# Patient Record
Sex: Female | Born: 1965 | Race: White | Hispanic: No | Marital: Married | Smoking: Never smoker
Health system: Southern US, Community
[De-identification: ages and names within clinical notes are randomized; demographics above are authoritative.]

## PROBLEM LIST (undated history)

## (undated) DIAGNOSIS — J309 Allergic rhinitis, unspecified: Secondary | ICD-10-CM

## (undated) DIAGNOSIS — D239 Other benign neoplasm of skin, unspecified: Secondary | ICD-10-CM

## (undated) DIAGNOSIS — J45909 Unspecified asthma, uncomplicated: Secondary | ICD-10-CM

## (undated) DIAGNOSIS — K120 Recurrent oral aphthae: Secondary | ICD-10-CM

## (undated) DIAGNOSIS — R87619 Unspecified abnormal cytological findings in specimens from cervix uteri: Secondary | ICD-10-CM

## (undated) DIAGNOSIS — L309 Dermatitis, unspecified: Secondary | ICD-10-CM

## (undated) DIAGNOSIS — K219 Gastro-esophageal reflux disease without esophagitis: Secondary | ICD-10-CM

## (undated) DIAGNOSIS — F329 Major depressive disorder, single episode, unspecified: Secondary | ICD-10-CM

## (undated) HISTORY — DX: Gastro-esophageal reflux disease without esophagitis: K21.9

## (undated) HISTORY — DX: Other benign neoplasm of skin, unspecified: D23.9

## (undated) HISTORY — DX: Recurrent oral aphthae: K12.0

## (undated) HISTORY — DX: Dermatitis, unspecified: L30.9

## (undated) HISTORY — PX: TONSILLECTOMY AND ADENOIDECTOMY: SUR1326

## (undated) HISTORY — DX: Major depressive disorder, single episode, unspecified: F32.9

## (undated) HISTORY — DX: Unspecified asthma, uncomplicated: J45.909

## (undated) HISTORY — DX: Allergic rhinitis, unspecified: J30.9

## (undated) HISTORY — PX: WISDOM TOOTH EXTRACTION: SHX21

## (undated) HISTORY — DX: Unspecified abnormal cytological findings in specimens from cervix uteri: R87.619

---

## 2000-12-20 ENCOUNTER — Other Ambulatory Visit: Admission: RE | Admit: 2000-12-20 | Discharge: 2000-12-20 | Payer: Self-pay | Admitting: Obstetrics and Gynecology

## 2001-01-31 ENCOUNTER — Inpatient Hospital Stay (HOSPITAL_COMMUNITY): Admission: EM | Admit: 2001-01-31 | Discharge: 2001-02-07 | Payer: Self-pay | Admitting: Psychiatry

## 2001-02-03 ENCOUNTER — Encounter: Payer: Self-pay | Admitting: Psychiatry

## 2001-02-03 ENCOUNTER — Ambulatory Visit (HOSPITAL_COMMUNITY): Admission: RE | Admit: 2001-02-03 | Discharge: 2001-02-03 | Payer: Self-pay | Admitting: Psychiatry

## 2001-02-10 ENCOUNTER — Other Ambulatory Visit (HOSPITAL_COMMUNITY): Admission: RE | Admit: 2001-02-10 | Discharge: 2001-02-17 | Payer: Self-pay | Admitting: *Deleted

## 2001-04-09 ENCOUNTER — Encounter: Admission: RE | Admit: 2001-04-09 | Discharge: 2001-04-09 | Payer: Self-pay | Admitting: *Deleted

## 2002-01-16 ENCOUNTER — Ambulatory Visit (HOSPITAL_COMMUNITY): Admission: RE | Admit: 2002-01-16 | Discharge: 2002-01-16 | Payer: Self-pay | Admitting: Family Medicine

## 2002-01-16 ENCOUNTER — Encounter: Payer: Self-pay | Admitting: Family Medicine

## 2002-11-20 ENCOUNTER — Other Ambulatory Visit: Admission: RE | Admit: 2002-11-20 | Discharge: 2002-11-20 | Payer: Self-pay | Admitting: *Deleted

## 2002-11-24 ENCOUNTER — Encounter: Payer: Self-pay | Admitting: Obstetrics and Gynecology

## 2002-11-24 ENCOUNTER — Encounter: Admission: RE | Admit: 2002-11-24 | Discharge: 2002-11-24 | Payer: Self-pay | Admitting: Obstetrics and Gynecology

## 2003-09-21 ENCOUNTER — Encounter: Payer: Self-pay | Admitting: Internal Medicine

## 2004-02-01 ENCOUNTER — Emergency Department (HOSPITAL_COMMUNITY): Admission: EM | Admit: 2004-02-01 | Discharge: 2004-02-01 | Payer: Self-pay | Admitting: Emergency Medicine

## 2004-06-06 ENCOUNTER — Other Ambulatory Visit: Admission: RE | Admit: 2004-06-06 | Discharge: 2004-06-06 | Payer: Self-pay | Admitting: *Deleted

## 2004-06-08 ENCOUNTER — Ambulatory Visit: Payer: Self-pay | Admitting: Internal Medicine

## 2004-06-29 ENCOUNTER — Ambulatory Visit: Payer: Self-pay | Admitting: Internal Medicine

## 2004-10-04 ENCOUNTER — Ambulatory Visit: Payer: Self-pay | Admitting: Family Medicine

## 2005-04-30 ENCOUNTER — Ambulatory Visit: Payer: Self-pay | Admitting: Internal Medicine

## 2005-11-01 ENCOUNTER — Other Ambulatory Visit: Admission: RE | Admit: 2005-11-01 | Discharge: 2005-11-01 | Payer: Self-pay | Admitting: Obstetrics and Gynecology

## 2006-01-29 ENCOUNTER — Emergency Department (HOSPITAL_COMMUNITY): Admission: EM | Admit: 2006-01-29 | Discharge: 2006-01-29 | Payer: Self-pay | Admitting: Emergency Medicine

## 2006-08-19 ENCOUNTER — Ambulatory Visit: Payer: Self-pay | Admitting: Internal Medicine

## 2006-08-20 ENCOUNTER — Encounter: Payer: Self-pay | Admitting: Internal Medicine

## 2007-04-15 ENCOUNTER — Encounter: Payer: Self-pay | Admitting: Internal Medicine

## 2007-04-17 ENCOUNTER — Encounter: Payer: Self-pay | Admitting: Internal Medicine

## 2007-11-07 ENCOUNTER — Ambulatory Visit: Payer: Self-pay | Admitting: Internal Medicine

## 2007-11-07 DIAGNOSIS — K219 Gastro-esophageal reflux disease without esophagitis: Secondary | ICD-10-CM | POA: Insufficient documentation

## 2007-11-07 DIAGNOSIS — F3289 Other specified depressive episodes: Secondary | ICD-10-CM

## 2007-11-07 DIAGNOSIS — K120 Recurrent oral aphthae: Secondary | ICD-10-CM | POA: Insufficient documentation

## 2007-11-07 DIAGNOSIS — J309 Allergic rhinitis, unspecified: Secondary | ICD-10-CM

## 2007-11-07 DIAGNOSIS — R070 Pain in throat: Secondary | ICD-10-CM | POA: Insufficient documentation

## 2007-11-07 DIAGNOSIS — F329 Major depressive disorder, single episode, unspecified: Secondary | ICD-10-CM

## 2007-11-07 HISTORY — DX: Other specified depressive episodes: F32.89

## 2007-11-07 HISTORY — DX: Gastro-esophageal reflux disease without esophagitis: K21.9

## 2007-11-07 HISTORY — DX: Major depressive disorder, single episode, unspecified: F32.9

## 2007-11-07 HISTORY — DX: Allergic rhinitis, unspecified: J30.9

## 2007-11-07 HISTORY — DX: Recurrent oral aphthae: K12.0

## 2007-11-10 LAB — CONVERTED CEMR LAB
Basophils Absolute: 0 10*3/uL (ref 0.0–0.1)
Basophils Relative: 0.6 % (ref 0.0–3.0)
Cholesterol: 136 mg/dL (ref 0–200)
Eosinophils Absolute: 0.2 10*3/uL (ref 0.0–0.7)
Eosinophils Relative: 4.2 % (ref 0.0–5.0)
HCT: 34.7 % — ABNORMAL LOW (ref 36.0–46.0)
HDL: 47.8 mg/dL (ref >39.0)
Hemoglobin: 12.1 g/dL (ref 12.0–15.0)
LDL Cholesterol: 74 mg/dL (ref 0–99)
Lymphocytes Relative: 25.6 % (ref 12.0–46.0)
MCHC: 34.8 g/dL (ref 30.0–36.0)
MCV: 94.1 fL (ref 78.0–100.0)
Monocytes Absolute: 0.6 10*3/uL (ref 0.1–1.0)
Monocytes Relative: 14.1 % — ABNORMAL HIGH (ref 3.0–12.0)
Neutro Abs: 2.3 10*3/uL (ref 1.4–7.7)
Neutrophils Relative %: 55.5 % (ref 43.0–77.0)
Platelets: 258 10*3/uL (ref 150–400)
RBC: 3.69 M/uL — ABNORMAL LOW (ref 3.87–5.11)
RDW: 13.4 % (ref 11.5–14.6)
Total CHOL/HDL Ratio: 2.8
Triglycerides: 70 mg/dL (ref 0–149)
VLDL: 14 mg/dL (ref 0–40)
WBC: 4.2 10*3/uL — ABNORMAL LOW (ref 4.5–10.5)

## 2007-12-07 ENCOUNTER — Encounter: Payer: Self-pay | Admitting: Internal Medicine

## 2007-12-18 ENCOUNTER — Telehealth: Payer: Self-pay | Admitting: Internal Medicine

## 2008-02-18 ENCOUNTER — Encounter: Admission: RE | Admit: 2008-02-18 | Discharge: 2008-02-18 | Payer: Self-pay | Admitting: Internal Medicine

## 2008-02-18 LAB — HM MAMMOGRAPHY: HM Mammogram: NEGATIVE

## 2009-07-25 ENCOUNTER — Ambulatory Visit: Payer: Self-pay | Admitting: Internal Medicine

## 2009-07-25 DIAGNOSIS — D239 Other benign neoplasm of skin, unspecified: Secondary | ICD-10-CM

## 2009-07-25 HISTORY — DX: Other benign neoplasm of skin, unspecified: D23.9

## 2009-09-08 ENCOUNTER — Telehealth (INDEPENDENT_AMBULATORY_CARE_PROVIDER_SITE_OTHER): Payer: Self-pay | Admitting: *Deleted

## 2010-02-02 ENCOUNTER — Ambulatory Visit: Payer: Self-pay | Admitting: Internal Medicine

## 2010-03-12 ENCOUNTER — Encounter: Payer: Self-pay | Admitting: Otolaryngology

## 2010-03-21 NOTE — Assessment & Plan Note (Signed)
Summary: reaction to med,sinus infection/jls   Vital Signs:  Patient Profile:   45 Years Old Female Weight:      133 pounds Temp:     98.3 degrees F oral Pulse rate:   72 / minute BP sitting:   100 / 70  (left arm) Cuff size:   regular  Vitals Entered By: Romualdo Bolk, CMA (November 07, 2007 11:37 AM)                 Chief Complaint:  Blisters in mouth.  History of Present Illness: Lisa Perkins is here for blisters in mouth. Pt was seen on [facility] on 11/04/07. Pt started on moxitag on 11/04/07 and then ceftin on 11/06/07. For sinus infection. Had no fever and head congestino and stuffy nose for about 2 days . Tends to get sinusitis if lets things go  So was rx with antibiotic.   Developing ulcers in the mouth but no white patches and no current fever but htinks had fever with uri.  Pt states that blisters are going down her throat.Burning pain to swallo high in neck.    Hx of extensive  gerd and ent allergy evals in the past. Is allergic on skin testing but gets side effect of PPis .  So takes nothing.  Has depression rx with lamictal   doing ok.    Prior Medications Reviewed Using: Patient Recall  Updated Prior Medication List: CITALOPRAM HYDROBROMIDE 10 MG TABS (CITALOPRAM HYDROBROMIDE) 3 by mouth once daily LAMICTAL 100 MG TABS (LAMOTRIGINE) 1 by mouth once daily  Current Allergies (reviewed today): ! 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         no brusing bleedingrashes   Physical Exam  General:     alert, well-developed, and well-nourished.   Head:     normocephalic and atraumatic.   Eyes:     vision grossly intact, pupils equal, and pupils round.   Ears:     R ear normal and L ear normal.   Nose:     no external deformity, no external erythema, and no nasal discharge.  minimal congestion Mouth:     op clear  apthous ulcer  under tongue  .? cobblestoning  Neck:     No deformities, masses, or noted. tenderness at ac area  Lungs:     Normal respiratory effort, chest expands symmetrically. Lungs are clear to auscultation, no crackles or wheezes. Heart:     Normal rate and regular rhythm. S1 and S2 normal without gallop, murmur, click, rub or other extra sounds. Abdomen:     Bowel sounds positive,abdomen soft and non-tender without masses, organomegaly or hernias noted. Skin:     turgor normal, no ecchymoses, and no petechiae.   Cervical  Nodes:     no anterior cervical adenopathy and no posterior cervical adenopathy.  but tender ac Psych:     memory intact for recent and remote, normally interactive, and good eye contact.      Impression & Recommendations:  Problem # 1:  THROAT PAIN (ICD-784.1) ? if related to antibiotic or not .  ? if uri was viral anyway  no thrush seen but at risk  .  check cbc for ? cw viral  Orders: TLB-CBC Platelet - w/Differential (85025-CBCD)   Problem # 2:  ORAL APHTHAE (ICD-528.2) local care as above call as needed Orders: TLB-CBC Platelet - w/Differential (85025-CBCD)   Problem # 3:  ALLERGIC RHINITIS (ICD-477.9) Assessment: Comment Only  Problem # 4:  ? of ADVERSE REACTION TO MEDICATION (WUX-324.40) ? if antibiotic  Problem # 5:  PREVENTIVE HEALTH CARE (ICD-V70.0) pt want tocheck this as not done recently Orders: TLB-Lipid Panel (80061-LIPID)   Problem # 6:  DEPRESSION (ICD-311) uncer  specialty care Her updated medication list for this problem includes:    Citalopram Hydrobromide 10 Mg Tabs (Citalopram hydrobromide) .Marland KitchenMarland KitchenMarland KitchenMarland Kitchen 3 by mouth once daily   Complete Medication List: 1)  Citalopram Hydrobromide 10 Mg Tabs (Citalopram hydrobromide) .... 3 by mouth once daily 2)  Lamictal 100 Mg Tabs (Lamotrigine) .Marland Kitchen.. 1 by mouth once daily   Patient Instructions: 1)  call you about your blood count 2)  stop the antibiotic for now 3)  Peroxyl       or peroxide  rinses for mouth ulcers   4)  try zantac two times a day for now.      ]

## 2010-03-21 NOTE — Consult Note (Signed)
Summary: Dr Randa Evens note  Dr Randa Evens note   Imported By: Kassie Mends 04/24/2007 11:13:12  _____________________________________________________________________  External Attachment:    Type:   Image     Comment:   Dr Randa Evens note

## 2010-03-21 NOTE — Procedures (Signed)
Summary: endoscopy   endoscopy   Imported By: Kassie Mends 04/29/2007 08:14:59  _____________________________________________________________________  External Attachment:    Type:   Image     Comment:   endoscopy

## 2010-03-21 NOTE — Progress Notes (Signed)
Summary: drug interaction  Phone Note Call from Patient Call back at [phone]   Caller: Patient Call For: Madelin Headings MD Reason for Call: Talk to Doctor Details for Reason: drug interaction Summary of Call: patient is calling because she is concerned about a possible drug interation between Celexa and Prilosec.  She was given prilosec at an urgent care for GERD.  soon after she started having chest pain which felt like a panic attack.  She has stopped both the Celexa and Prilosec for a few days and is no longer having chest pain.  she would like to know what to do next? Initial call taken by: Kern Reap CMA Duncan Dull),  September 08, 2009 10:52 AM  Follow-up for Phone Call        Im not sure  that  this is is a drug interaction but would have her  call her Psych  and see what they say.     I dont see sig I/A listed  on  our med program .    If she is having panic she need to see them  Follow-up by: Madelin Headings MD,  September 08, 2009 12:38 PM  Additional Follow-up for Phone Call Additional follow up Details #1::        attempt to call - ans mach - LMTCB if questions -should call Psych. to be seen ?panic attack. KIK Additional Follow-up by: Duard Brady LPN,  September 09, 2009 8:17 AM

## 2010-03-21 NOTE — Miscellaneous (Signed)
Summary: Influenza Vaccination/[facility]  Influenza Vaccination/[facility]   Imported By: Maryln Gottron 12/12/2007 14:40:37  _____________________________________________________________________  External Attachment:    Type:   Image     Comment:   External Document

## 2010-03-21 NOTE — Progress Notes (Signed)
Summary: Tetanus  Phone Note Call from Patient   Caller: Patient Call For: Dr. Fabian Sharp Summary of Call: Pt. had a dog bite last night, and is being treated but needs to know about her Tetanus.  She thinks that her last tetanus was at age 45. [phone] Initial call taken by: Lynann Beaver CMA,  December 18, 2007 8:15 AM  Follow-up for Phone Call        Pt needs to have a Tdap because there is nothing in her chart about her last td. Follow-up by: Romualdo Bolk, CMA,  December 18, 2007 11:25 AM  Additional Follow-up for Phone Call Additional follow up Details #1::        Left message to call back for appt for Tdap with [facility]. Additional Follow-up by: Lynann Beaver CMA,  December 18, 2007 11:29 AM

## 2010-03-21 NOTE — Consult Note (Signed)
[facility]   Imported By: Kassie Mends 11/10/2007 10:37:27  _____________________________________________________________________  External Attachment:    Type:   Image     Comment:   [facility]

## 2010-03-21 NOTE — Assessment & Plan Note (Signed)
Summary: EVAL OF MOLE // RS   Vital Signs:  Patient profile:   45 year old female Menstrual status:  regular LMP:     07/11/2009 Height:      66.75 inches Weight:      134 pounds BMI:     21.22 Pulse rate:   72 / minute BP sitting:   100 / 60  (right arm) Cuff size:   regular  Vitals Entered By: Romualdo Bolk, CMA (AAMA) (July 25, 2009 3:06 PM) CC: Mole Check- 2 on stomach- and 1 on left hand needs to be checked. LMP (date): 07/11/2009 LMP - Character: normal Menarche (age onset years): 12   Menses interval (days): 28 Menstrual flow (days): 4-5 Menstrual Status regular Enter LMP: 07/11/2009   History of Present Illness: Lisa Perkins comes in today  for check moles  . family hx of cancer on face  .  ? if one changing on abdomen. NO med change  sees Dr Misty Stanley Pauhlus. travels [facility]  and is in the car a good bit .  New sopr left hand. otherwise, since last visit  here  there have been no major changes in health status  .  Preventive Screening-Counseling & Management  Alcohol-Tobacco     Alcohol drinks/day: <1     Alcohol type: all     Smoking Status: never  Caffeine-Diet-Exercise     Caffeine use/day: 2     Does Patient Exercise: yes  Current Medications (verified): 1)  Citalopram Hydrobromide 10 Mg Tabs (Citalopram Hydrobromide) .... 3 By Mouth Once Daily 2)  Lamictal 100 Mg Tabs (Lamotrigine) .Marland Kitchen.. 1 By Mouth Once Daily  Allergies (verified): 1)  ! Erythromycin 2)  Amoxicillin  Past History:  Care Management: Gynecology: [facility] Psychiatry: [facility].  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   sun freckling  forehead and scalp area  no suspicious lesions. left hand 1 mm brown flat mole  symmertical  trunk right  ..  pencil  eraser sized flat mole .  not raised.    Impression & Recommendations:  Problem # 1:  BENIGN NEOPLASM OF SKIN SITE UNSPECIFIED (ICD-216.9) none particualry suspcious   but  has fair  skin and family hx  reviewed and counseled about sunscreen daily use .  PT agrees.  Complete Medication List: 1)  Citalopram Hydrobromide 10 Mg Tabs (Citalopram hydrobromide) .... 3 by mouth once daily 2)  Lamictal 100 Mg Tabs (Lamotrigine) .Marland Kitchen.. 1 by mouth once daily  greater than 50% of visit spent in counseling  15 minutes

## 2010-03-23 NOTE — Assessment & Plan Note (Signed)
Summary: knee pain/due to dog bite/njr   Vital Signs:  Patient profile:   45 year old female Menstrual status:  regular Weight:      136 pounds Temp:     98.3 degrees F oral BP sitting:   108 / 70  (right arm) Cuff size:   regular  Vitals Entered By: Duard Brady LPN (February 02, 2010 3:12 PM) CC: c/o pain on (L) lef - dog bite upper back of calf Is Patient Diabetic? No   CC:  c/o pain on (L) lef - dog bite upper back of calf.  History of Present Illness: 45 year old who is seen today 7 days following a dog bite to her left calf area.  She was evaluated at the urgent care and given a tetanus booster and is complaining Augmentin antibiotic therapy.  She was concerned about some increasing pain, mainly  a tingling sensation in the left lower leg area is also noted some mild discomfort in the left hip region.  There is been no fever or constitutional complaints.  Her puncture  wound  and ecchymoses  are  nicely healing  Allergies: 1)  ! Erythromycin 2)  Amoxicillin  Physical Exam  General:  Well-developed,well-nourished,in no acute distress; alert,appropriate and cooperative throughout examination Msk:  patient has a small puncture wound involving the left upper posterior medial aspect of the calf.  This appeared to be clean and crusted without surrounding erythema.  There is no drainage.  There were some resolving ecchymoses with some subcutaneous hematoma formation   Impression & Recommendations:  Problem # 1:  DOG BITE (ICD-E906.0) the wound appears to be responding quite well to prophylactic antibiotic therapy and there is no sign of infection.  Suspect the hip pain may be related to the injury and altered biomechanics.  Complete Medication List: 1)  Citalopram Hydrobromide 10 Mg Tabs (Citalopram hydrobromide) .... 3 by mouth once daily 2)  Lamictal 100 Mg Tabs (Lamotrigine) .Marland Kitchen.. 1 by mouth once daily 3)  Augmentin 875-125 Mg Tabs (Amoxicillin-pot clavulanate) 4)   Tramadol Hcl 50 Mg Tabs (Tramadol hcl) .... One every 6 hours for pain  Patient Instructions: 1)  call if  you  develop  fever or worsening pain or drainage Prescriptions: TRAMADOL HCL 50 MG TABS (TRAMADOL HCL) one every 6 hours for pain  #30 x 0   Entered and Authorized by:   Gordy Savers  MD   Signed by:   Gordy Savers  MD on 02/02/2010   Method used:   Electronically to        [facility] [address] (retail)       [address]       [place], [place]  [address]       Ph: [phone] or [phone]       Fax: [phone]   RxID:   [id number]    Orders Added: 1)  Est. Patient Level III [27253]

## 2010-07-07 NOTE — Discharge Summary (Signed)
[facility]  Patient:    Lisa Perkins Visit Number: [id number] MRN: [id number]          Service Type: PSY Location: 400 [id number] 02 Attending Physician:  Jeanice Lim Dictated by:   Jeanice Lim, M.D. Admit Date:  01/31/2001 Discharge Date: 02/07/2001                             Discharge Summary  IDENTIFYING DATA:  This is a 45 year old divorced white female that was voluntarily admitted on December 13 for bizarre thinking.  She was rambling, having been feeling that she was dehydrated and leaking spinal fluid because her urine was clear.   She believed she was having multisystem failure, had been changing her diet, taking vitamins and herbal supplements.  The patient had seen Ollen Gross, psychologist in [place].  ADMISSION MEDICATIONS:  Zoloft started in December, taking other supplements, multivitamins, calcium and magnesium supplements, Similase enzymes, permeability factor, water soluble vitamin A for the past 2-3 weeks.  ALLERGIES:  AMOXICILLIN, ZITHROMYCIN.  PHYSICAL EXAMINATION:  Unremarkable.  Neurologically nonfocal.  ROUTINE ADMISSION LABS:  Essentially within normal limits except for low potassium 3.1.  Urine was negative nitrite and thyroid and urine pregnancy were negative.  MENTAL STATUS EXAMINATION:  This is an alert, young middle-aged, casually dressed female in the interview room with a pillow and stuffed animal.  She was cooperative, with good eye contact.  Speech within normal limits.  Mood pleasant and depressed, affect sad and flat.  Thought processes were goal directed.  Thought content positive for delusions, no paranoid ideation or hallucinations, and no suicidal or homicidal ideations.  ADMISSION DIAGNOSES: Axis I:    1. Major depression with psychotic features.            2. Obsessive-compulsive disorder Axis II:   None. Axis III:  Multiple somatic complaints. Axis IV:   Moderate, problems with primary  support. Axis V:    35/75.  HOSPITAL COURSE:  The patient was admitted with routine p.r.n. medications and started on Zyprexa and Celexa for depressive, obsessive, and delusional thoughts.  The patient tolerated medications well and reality testing improved.  She reported improvement in insight regarding her mental illness and awareness that most of her complaints were related to her nerves and not a physical illness, as well the dangerousness of taking herbal supplements including especially high doses of Vitamin A which can cause toxicity.  CONDITION ON DISCHARGE:  Markedly improved.  Her mood was more euthymic, affect brighter, thought processes goal directed, thought content was no longer preoccupied with somatic delusions, still at risk of being suggestible regarding having physical disorders, but no overt psychotic symptoms and no hallucinations, and no suicidal or homicidal ideations.  Patient reported motivation to be compliant with followup plan.  DISCHARGE MEDICATIONS: 1. Bactrim DS b.i.d. 2. Calcium carbonate 500 mg qam 3. Celexa 40 mg qam 4. Klonopin 0.5 mg q.h.s. 5. Zyprexa 10 mg q.h.s. 6. Ambien 10 mg p.r.n.  The patient was advised to to take over-the-counter meds unless improved by her psychiatrist, eat a balanced diet with no restrictions and to take no added vitamins at this time.  She was also recommended to follow up with a nutritionist and with her therapist, Ollen Gross, as well as start the Intensive Outpatient Program on December 23 at 9 a.m.  DISCHARGE DIAGNOSES: Axis I:    1. Major depression with psychotic features.  2. Obsessive-compulsive disorder Axis II:   None. Axis III:  Multiple somatic complaints. Axis IV:   Moderate, problems with primary support. Axis V:    Global assessment of function on discharge was 55. Dictated by:   Jeanice Lim, M.D. Attending Physician:  Jeanice Lim DD:  03/08/01 TD:  03/08/01 Job:  [id number]

## 2010-07-07 NOTE — H&P (Signed)
[facility]  Patient:    Loleta Books Visit Number: [id number] MRN: [id number]          Service Type: EMS Location: [facility] Attending Physician:  Armanda Heritage Dictated by:   Candi Leash. Orsini, N.P. Admit Date:  01/30/2001 Discharge Date: 01/30/2001                     Psychiatric Admission Assessment  DATE OF ADMISSION:  January 31, 2001  PATIENT IDENTIFICATION:  This is a 45 year old divorced white female that was voluntarily admitted on January 31, 2001, for bizarre thinking.  HISTORY OF PRESENT ILLNESS:  The patient presents to [facility] Dept with rambling, history of being "dehydrated," and "leaking cerebral spinal fluid" because her urine is clear.  The patient reports having a history of panic attacks, three to four per month.  The patient reports having several different somatic problems, joint pain, cognitive changes, feeling like she is having "multisystem failure."  The patient has been going to several M.D.s, using herbal supplements, changing her diet, taking vitamins.  The patient reports that she is also having episodes of crying with fleeting suicidal ideation to run her car off the road with one episode of hearing someone screaming in her ear.  Denies any visual hallucinations or paranoia.  The patient continues to feel that she is having to "search for words" during the interview.  The patient denies any suicidal or homicidal ideations or psychotic symptoms.  Sleeping has been decreased.  Appetite has been decreased.  The patient reports that she has been eating only 600-800 calories per day.  PAST PSYCHIATRIC HISTORY:  First visit to [facility].  The patient sees Ollen Gross, psychologist in [place].  SUBSTANCE ABUSE HISTORY:  Nonsmoker, nondrinker.  Denies any substance abuse.  PAST MEDICAL HISTORY:  Primary care Naome Brigandi: Neta Mends. Fabian Sharp, M.D. [facility], on [facility].  Medical problems: GERD,  chronic lower back pain.  MEDICATIONS: 1. Zoloft 25 mg started on January 30, 2001. 2. Taking other supplements, multivitamin, calcium, magnesium supplements and    has been taking Similase enzymes, permeability factor, water soluble    vitamin A that she gets through the mail for the past two to three    weeks.  DRUG ALLERGIES:  AMOXICILLIN, AZITHROMYCIN.  PHYSICAL EXAMINATION:  GENERAL:  Performed at [facility].  LABORATORY DATA:  CBC was within normal limits.  Potassium initially was 3.1. Urine showed many bacteria, many epithelials, negative nitrites.  Thyroid and urine pregnancy tests are pending.  SOCIAL HISTORY:  She is a 45 year old divorced white female, divorced for three years.  She has no children.  She lives with her boyfriend.  She works as a Doctor, general practice at [employer].  Has no legal problems.  FAMILY HISTORY:  Sister with panic attacks and depression, on Zoloft.  MENTAL STATUS EXAMINATION:  She is an alert, young middle-aged, casually dressed female in the interview room with a pillow and a stuffed animal.  She is cooperative with good eye contact.  Speech is normal and relevant and articulate.  Mood is pleasant, depressed.  Affect is sad and flat.  Thought processes: Positive delusions, no paranoia, no auditory or visual hallucinations, no suicidal or homicidal ideations.  Cognitive: Intact. Oriented x 3.  Judgment is fair.  Insight is fair.  Appears to have above average intelligence.  ADMISSION DIAGNOSES: Axis I:    1. Major depression with psychotic features.  2. Rule out obsessive-compulsive disorder. Axis II:   Deferred. Axis III:  Multiple somatic complaints. Axis IV:   Deferred. Axis V:    Current is 35, estimated this past year is 75.  INITIAL PLAN OF CARE:  Plan is a voluntary admission to [facility] for depression, anxiety, and suicidal ideation and delusional thinking. Contract for safety.  Check  every 15 minutes.  The patient is put on a locked hall for close monitoring.  Will resume her Zoloft and increase to decrease depressive symptoms and Ativan for anxiety.  Will obtain further labs.  Will repeat her urine and CMET.  Will have a session with her boyfriend and her mother.  Will monitor her food and fluid intake.  Gatorade will be offered for potassium and to ensure fluid intake.  Goal is to return the patient to prior living arrangements, to stabilize mood and thinking so the patient can be safe, to follow up with mental health and her private psychiatrist.  ESTIMATED LENGTH OF STAY:  Four to five days. Dictated by:   Candi Leash. Orsini, N.P. Attending Physician:  Armanda Heritage DD:  02/03/01 TD:  02/03/01 Job: [id number]

## 2011-01-25 ENCOUNTER — Ambulatory Visit (INDEPENDENT_AMBULATORY_CARE_PROVIDER_SITE_OTHER): Payer: 59 | Admitting: Internal Medicine

## 2011-01-25 ENCOUNTER — Encounter: Payer: Self-pay | Admitting: Internal Medicine

## 2011-01-25 VITALS — BP 100/80 | Temp 98.3°F | Ht 67.75 in | Wt 139.0 lb

## 2011-01-25 DIAGNOSIS — M545 Low back pain, unspecified: Secondary | ICD-10-CM

## 2011-01-25 MED ORDER — TRAMADOL HCL 50 MG PO TABS: 50.0000 mg | ORAL_TABLET | Freq: Four times a day (QID) | ORAL | Status: DC | PRN

## 2011-01-25 NOTE — Progress Notes (Signed)
  Subjective:    Patient ID: Lisa Perkins, female    DOB: [birth year], 45 y.o.   MRN: [id number]  HPI  45 year old patient who presents complaining of low back pain that occurred 4 days ago while outdoor yard work over the weekend. She presents with radiographs and she states that she has a history of scoliosis and also lumbar disc disease. She feels that she has pulled a muscle involved the right lumbar area and is steadily improving. She has been using her husband's Celebrex with improvement    Review of Systems  Constitutional: Negative.   HENT: Negative for hearing loss, congestion, sore throat, rhinorrhea, dental problem, sinus pressure and tinnitus.   Eyes: Negative for pain, discharge and visual disturbance.  Respiratory: Negative for cough and shortness of breath.   Cardiovascular: Negative for chest pain, palpitations and leg swelling.  Gastrointestinal: Negative for nausea, vomiting, abdominal pain, diarrhea, constipation, blood in stool and abdominal distention.  Genitourinary: Negative for dysuria, urgency, frequency, hematuria, flank pain, vaginal bleeding, vaginal discharge, difficulty urinating, vaginal pain and pelvic pain.  Musculoskeletal: Positive for back pain. Negative for joint swelling, arthralgias and gait problem.  Skin: Negative for rash.  Neurological: Negative for dizziness, syncope, speech difficulty, weakness, numbness and headaches.  Hematological: Negative for adenopathy.  Psychiatric/Behavioral: Negative for behavioral problems, dysphoric mood and agitation. The patient is not nervous/anxious.        Objective:   Physical Exam  Constitutional: She appears well-developed and well-nourished. No distress.  Musculoskeletal: Normal range of motion. She exhibits tenderness. She exhibits no edema.       Mild tenderness right lumbar spine Negative straight leg test          Assessment & Plan:   Back pain secondary to musculoligamentous strain. Samples  of Celebrex dispensed Low back pain issues discussed Return when necessary

## 2011-01-25 NOTE — Patient Instructions (Addendum)
Most patients with low back pain will improve with time.  Keep active but avoid any activities that cause pain.  Apply moist heat to the low back area several times daily.  Celebrex 2 capsules daily  Call or return to clinic prn if these symptoms worsen or fail to improve as anticipated.

## 2011-06-07 ENCOUNTER — Telehealth: Payer: Self-pay | Admitting: *Deleted

## 2011-06-07 MED ORDER — OLOPATADINE HCL 0.1 % OP SOLN: 1.0000 [drp] | Freq: Two times a day (BID) | OPHTHALMIC | Status: DC

## 2011-06-07 NOTE — Telephone Encounter (Addendum)
Pt states she needs Patanol Eye Drops.  Her eye is matted shut in the am, and has to wash it out q am.   Believes Dr. Fabian Sharp has done this before.

## 2011-06-07 NOTE — Telephone Encounter (Signed)
Ok to send in [employer] in

## 2011-06-08 MED ORDER — OLOPATADINE HCL 0.1 % OP SOLN: 1.0000 [drp] | Freq: Two times a day (BID) | OPHTHALMIC | Status: DC

## 2011-08-08 ENCOUNTER — Ambulatory Visit (INDEPENDENT_AMBULATORY_CARE_PROVIDER_SITE_OTHER): Payer: 59 | Admitting: Internal Medicine

## 2011-08-08 ENCOUNTER — Encounter: Payer: Self-pay | Admitting: Internal Medicine

## 2011-08-08 ENCOUNTER — Other Ambulatory Visit (INDEPENDENT_AMBULATORY_CARE_PROVIDER_SITE_OTHER): Payer: 59 | Admitting: Family Medicine

## 2011-08-08 VITALS — BP 104/60 | HR 90 | Temp 98.7°F | Wt 133.0 lb

## 2011-08-08 DIAGNOSIS — R8290 Unspecified abnormal findings in urine: Secondary | ICD-10-CM

## 2011-08-08 DIAGNOSIS — N898 Other specified noninflammatory disorders of vagina: Secondary | ICD-10-CM

## 2011-08-08 DIAGNOSIS — L293 Anogenital pruritus, unspecified: Secondary | ICD-10-CM

## 2011-08-08 DIAGNOSIS — R300 Dysuria: Secondary | ICD-10-CM

## 2011-08-08 DIAGNOSIS — R109 Unspecified abdominal pain: Secondary | ICD-10-CM

## 2011-08-08 DIAGNOSIS — R82998 Other abnormal findings in urine: Secondary | ICD-10-CM

## 2011-08-08 LAB — POCT URINALYSIS DIPSTICK
Bilirubin, UA: NEGATIVE
Glucose, UA: NEGATIVE
Ketones, UA: NEGATIVE
Nitrite, UA: NEGATIVE
Protein, UA: NEGATIVE
Spec Grav, UA: >=1.03
Urobilinogen, UA: 0.2
pH, UA: 8

## 2011-08-08 LAB — POCT URINALYSIS DIP (MANUAL ENTRY)
Bilirubin, UA: NEGATIVE
Glucose, UA: NEGATIVE
Ketones, POC UA: NEGATIVE
Leukocytes, UA: NEGATIVE
Nitrite, UA: NEGATIVE
Protein Ur, POC: NEGATIVE
Spec Grav, UA: 1.01
Urobilinogen, UA: 0.2
pH, UA: 7

## 2011-08-08 MED ORDER — FLUCONAZOLE 150 MG PO TABS: 150.0000 mg | ORAL_TABLET | Freq: Once | ORAL | Status: AC

## 2011-08-08 NOTE — Progress Notes (Signed)
  Subjective:    Patient ID: Lisa Perkins, female    DOB: [birth year], 46 y.o.   MRN: [id number]  HPI Patient comes in today for SDA for  new problem evaluation. Last visit with me was 12 09 was seen  byt dr Kirtland Bouchard for backpain lat December . Diflucan in the past via urgent care. For the vaginal itching is helpful no dc.  Has gyne Shirlyn Goltz.  5 month check up was normal .  No fever  abd cramps and poss uti.   Some bloating.  Little urgency.  No  To little risk of sti.  Mood stable  Per psych [facility]   Review of Systems No fever chills  Cp sob hematuria  Past history family history social history reviewed in the electronic medical record.     Objective:   Physical Exam  BP 104/60  Pulse 90  Temp 98.7 F (37.1 C) (Oral)  Wt 133 lb (60.328 kg)  SpO2 99%  LMP 08/02/2011 wdwn in nad Abdomen:  Sof,t normal bowel sounds without hepatosplenomegaly, no guarding rebound or masses no CVA tenderness mild suprapubic tenderness  ua first void small amount 3 + blood trace leuk  After water  trc blood and no leuk      Assessment & Plan:   Vaginal itching; pt feels is yeast and prefers diflucan vs topicals  Ok to rx med. Empiric rx and will recheck prn no better  abd suprapubic sx mild   Check ur cx and rx accordingly.  R/O uti  Will call with results.

## 2011-08-08 NOTE — Patient Instructions (Signed)
Will contact you  About culture results. Call in meantime if needed. Treat for yeast in the meantime.

## 2011-08-10 ENCOUNTER — Ambulatory Visit (INDEPENDENT_AMBULATORY_CARE_PROVIDER_SITE_OTHER): Payer: 59 | Admitting: Internal Medicine

## 2011-08-10 ENCOUNTER — Encounter: Payer: Self-pay | Admitting: Internal Medicine

## 2011-08-10 ENCOUNTER — Other Ambulatory Visit (HOSPITAL_COMMUNITY)
Admission: RE | Admit: 2011-08-10 | Discharge: 2011-08-10 | Disposition: A | Discharge status: Home / Self Care | Payer: 59 | Source: Ambulatory Visit | Attending: Internal Medicine | Admitting: Internal Medicine

## 2011-08-10 VITALS — BP 98/66 | Temp 98.6°F | Wt 132.0 lb

## 2011-08-10 DIAGNOSIS — Z113 Encounter for screening for infections with a predominantly sexual mode of transmission: Secondary | ICD-10-CM | POA: Insufficient documentation

## 2011-08-10 DIAGNOSIS — N949 Unspecified condition associated with female genital organs and menstrual cycle: Secondary | ICD-10-CM

## 2011-08-10 DIAGNOSIS — R102 Pelvic and perineal pain: Secondary | ICD-10-CM | POA: Insufficient documentation

## 2011-08-10 DIAGNOSIS — N760 Acute vaginitis: Secondary | ICD-10-CM | POA: Insufficient documentation

## 2011-08-10 DIAGNOSIS — Z01419 Encounter for gynecological examination (general) (routine) without abnormal findings: Secondary | ICD-10-CM | POA: Insufficient documentation

## 2011-08-10 LAB — URINE CULTURE
Colony Count: NO GROWTH
Organism ID, Bacteria: NO GROWTH

## 2011-08-10 MED ORDER — METRONIDAZOLE 500 MG PO TABS: 500.0000 mg | ORAL_TABLET | Freq: Two times a day (BID) | ORAL | Status: AC

## 2011-08-10 NOTE — Patient Instructions (Signed)
We'll let she know of laboratory test when they are back.  We'll treat you for bacterial vaginosis at this time although I am unsure if this could be causing your pain.   Would like you to see your gynecologist in followup.  Bacterial Vaginosis Bacterial vaginosis (BV) is a vaginal infection where the normal balance of bacteria in the vagina is disrupted. The normal balance is then replaced by an overgrowth of certain bacteria. There are several different kinds of bacteria that can cause BV. BV is the most common vaginal infection in women of childbearing age. CAUSES   The cause of BV is not fully understood. BV develops when there is an increase or imbalance of harmful bacteria.   Some activities or behaviors can upset the normal balance of bacteria in the vagina and put women at increased risk including:   Having a new sex partner or multiple sex partners.   Douching.   Using an intrauterine device (IUD) for contraception.   It is not clear what role sexual activity plays in the development of BV. However, women that have never had sexual intercourse are rarely infected with BV.  Women do not get BV from toilet seats, bedding, swimming pools or from touching objects around them.  SYMPTOMS   Grey vaginal discharge.   A fish-like odor with discharge, especially after sexual intercourse.   Itching or burning of the vagina and vulva.   Burning or pain with urination.   Some women have no signs or symptoms at all.  DIAGNOSIS  Your caregiver must examine the vagina for signs of BV. Your caregiver will perform lab tests and look at the sample of vaginal fluid through a microscope. They will look for bacteria and abnormal cells (clue cells), a pH test higher than 4.5, and a positive amine test all associated with BV.  RISKS AND COMPLICATIONS   Pelvic inflammatory disease (PID).   Infections following gynecology surgery.   Developing HIV.   Developing herpes virus.  TREATMENT    Sometimes BV will clear up without treatment. However, all women with symptoms of BV should be treated to avoid complications, especially if gynecology surgery is planned. Female partners generally do not need to be treated. However, BV may spread between female sex partners so treatment is helpful in preventing a recurrence of BV.   BV may be treated with antibiotics. The antibiotics come in either pill or vaginal cream forms. Either can be used with nonpregnant or pregnant women, but the recommended dosages differ. These antibiotics are not harmful to the baby.   BV can recur after treatment. If this happens, a second round of antibiotics will often be prescribed.   Treatment is important for pregnant women. If not treated, BV can cause a premature delivery, especially for a pregnant woman who had a premature birth in the past. All pregnant women who have symptoms of BV should be checked and treated.   For chronic reoccurrence of BV, treatment with a type of prescribed gel vaginally twice a week is helpful.  HOME CARE INSTRUCTIONS   Finish all medication as directed by your caregiver.   Do not have sex until treatment is completed.   Tell your sexual partner that you have a vaginal infection. They should see their caregiver and be treated if they have problems, such as a mild rash or itching.   Practice safe sex. Use condoms. Only have 1 sex partner.  PREVENTION  Basic prevention steps can help reduce the risk of  upsetting the natural balance of bacteria in the vagina and developing BV:  Do not have sexual intercourse (be abstinent).   Do not douche.   Use all of the medicine prescribed for treatment of BV, even if the signs and symptoms go away.   Tell your sex partner if you have BV. That way, they can be treated, if needed, to prevent reoccurrence.  SEEK MEDICAL CARE IF:   Your symptoms are not improving after 3 days of treatment.   You have increased discharge, pain, or fever.   MAKE SURE YOU:   Understand these instructions.   Will watch your condition.   Will get help right away if you are not doing well or get worse.  FOR MORE INFORMATION  Division of STD Prevention (DSTDP), Centers for Disease Control and Prevention: [employer] American Social Health Association (ASHA): www.ashastd.org  Document Released: 02/05/2005 Document Revised: 01/25/2011 Document Reviewed: 07/29/2008 [facility] Patient Information 2012 [place], [place].

## 2011-08-10 NOTE — Progress Notes (Signed)
  Subjective:    Patient ID: Lisa Perkins, female    DOB: 09-Sep-1965, 46 y.o.   MRN: [id number]  HPI Patient comes in today for SDA for  new problem evaluation. See last visit  she is not much better and her lower abdominal discomfort she thinks is worse. Almost like cramping. No new dysuria tried N/A so without any help. Has used over-the-counter cream with minimal help with the itching burning. Pain is now persistent and uncomfortable. Has not had this problem before. No change in bowel habits although she does have IBS. No fever except she did take her temperature was 99.4 which she considers higher than usual. There is no chills or hematuria Has no past history of endometriosis or major pelvic problems. No history of recurrent bladder issues. Has gyne last Pap done about 5 months ago.  Review of Systems Negative for chest pain shortness of breath vomiting change in bowel habits blood in her urine rest as per history of present illness Past history family history social history reviewed in the electronic medical record.    Objective:   Physical Exam BP 98/66  Temp 98.6 F (37 C) (Oral)  Wt 132 lb (59.875 kg)  LMP 08/02/2011 Well-developed well-nourished in no acute distress. Abdomen soft without organomegaly guarding or rebound points to bilateral lower pelvic area as area of concern but no guarding rebound or masses. Pelvic exam external  GU mild redness but no lesions ulcers or masses no inguinal adenopathy. Cervix clear mucoid white discharge no ectopy. Vaginal slightly red scant discharge gray to white. Bimanual negative CMT or adnexal pain however points to the lower. Uterine area as discomfort. urine culture test negative for bacteria   vaginal cervical specimen sent for GC Chlamydia BV Candida and trick    Assessment & Plan:  continuing vaginal sx  with lower abdominal pelvic pain Uncertain cause exam is reassuring we'll treat empirically for BP with metronidazole    see  her gyne next week. Or if continuing symptoms

## 2011-08-10 NOTE — Progress Notes (Signed)
Quick Note:  Tell patient that urine culture shows No growth. ______

## 2011-08-29 ENCOUNTER — Encounter: Payer: Self-pay | Admitting: Family Medicine

## 2012-01-29 ENCOUNTER — Encounter: Payer: Self-pay | Admitting: Family Medicine

## 2012-01-29 ENCOUNTER — Ambulatory Visit (INDEPENDENT_AMBULATORY_CARE_PROVIDER_SITE_OTHER): Payer: 59 | Admitting: Family Medicine

## 2012-01-29 ENCOUNTER — Ambulatory Visit: Payer: Self-pay | Admitting: Family

## 2012-01-29 ENCOUNTER — Telehealth: Payer: Self-pay | Admitting: Internal Medicine

## 2012-01-29 VITALS — BP 102/72 | HR 93 | Temp 98.6°F | Wt 137.0 lb

## 2012-01-29 DIAGNOSIS — S61409A Unspecified open wound of unspecified hand, initial encounter: Secondary | ICD-10-CM

## 2012-01-29 DIAGNOSIS — W540XXA Bitten by dog, initial encounter: Secondary | ICD-10-CM

## 2012-01-29 DIAGNOSIS — S61459A Open bite of unspecified hand, initial encounter: Secondary | ICD-10-CM

## 2012-01-29 MED ORDER — AMOXICILLIN-POT CLAVULANATE 875-125 MG PO TABS: 1.0000 | ORAL_TABLET | Freq: Two times a day (BID) | ORAL | Status: DC

## 2012-01-29 NOTE — Patient Instructions (Signed)
-As we discussed, we have prescribed a new medication (AUGMENTIN) for you at this appointment. We discussed the common and serious potential adverse effects of this medication and you can review these and more with the pharmacist when you pick up your medication.  Please follow the instructions for use carefully and notify us immediately if you have any problems taking this medication.    Animal Bite An animal bite can result in a scratch on the skin, deep open cut, puncture of the skin, crush injury, or tearing away of the skin or a body part. Dogs are responsible for most animal bites. Children are bitten more often than adults. An animal bite can range from very mild to more serious. A small bite from your house pet is no cause for alarm. However, some animal bites can become infected or injure a bone or other tissue. You must seek medical care if:  The skin is broken and bleeding does not slow down or stop after 15 minutes.  The puncture is deep and difficult to clean (such as a cat bite).  Pain, warmth, redness, or pus develops around the wound.  The bite is from a stray animal or rodent. There may be a risk of rabies infection.  The bite is from a snake, raccoon, skunk, fox, coyote, or bat. There may be a risk of rabies infection.  The person bitten has a chronic illness such as diabetes, liver disease, or cancer, or the person takes medicine that lowers the immune system.  There is concern about the location and severity of the bite. It is important to clean and protect an animal bite wound right away to prevent infection. Follow these steps:  Clean the wound with plenty of water and soap.  Apply an antibiotic cream.  Apply gentle pressure over the wound with a clean towel or gauze to slow or stop bleeding.  Elevate the affected area above the heart to help stop any bleeding.  Seek medical care. Getting medical care within 8 hours of the animal bite leads to the best possible  outcome. DIAGNOSIS  Your caregiver will most likely:  Take a detailed history of the animal and the bite injury.  Perform a wound exam.  Take your medical history. Blood tests or X-rays may be performed. Sometimes, infected bite wounds are cultured and sent to a lab to identify the infectious bacteria.  TREATMENT  Medical treatment will depend on the location and type of animal bite as well as the patient's medical history. Treatment may include:  Wound care, such as cleaning and flushing the wound with saline solution, bandaging, and elevating the affected area.  Antibiotics.  Tetanus immunization.  Rabies immunization.  Leaving the wound open to heal. This is often done with animal bites, due to the high risk of infection. However, in certain cases, wound closure with stitches, wound adhesive, skin adhesive strips, or staples may be used. Infected bites that are left untreated may require intravenous (IV) antibiotics and surgical treatment in the hospital. HOME CARE INSTRUCTIONS  Follow your caregiver's instructions for wound care.  Take all medicines as directed.  If your caregiver prescribes antibiotics, take them as directed. Finish them even if you start to feel better.  Follow up with your caregiver for further exams or immunizations as directed. You may need a tetanus shot if:  You cannot remember when you had your last tetanus shot.  You have never had a tetanus shot.  The injury broke your skin. If you  get a tetanus shot, your arm may swell, get red, and feel warm to the touch. This is common and not a problem. If you need a tetanus shot and you choose not to have one, there is a rare chance of getting tetanus. Sickness from tetanus can be serious. SEEK MEDICAL CARE IF:  You notice warmth, redness, soreness, swelling, pus discharge, or a bad smell coming from the wound.  You have a red line on the skin coming from the wound.  You have a fever, chills, or a  general ill feeling.  You have nausea or vomiting.  You have continued or worsening pain.  You have trouble moving the injured part.  You have other questions or concerns. MAKE SURE YOU:  Understand these instructions.  Will watch your condition.  Will get help right away if you are not doing well or get worse. Document Released: 10/24/2010 Document Revised: 04/30/2011 Document Reviewed: 10/24/2010 [facility] Patient Information 2013 [place], [place].

## 2012-01-29 NOTE — Telephone Encounter (Signed)
Patient Information:  Caller Name: Rylin  Phone: [phone]  Patient: Lisa Perkins, Lisa Perkins  Gender: Female  DOB: 28-Jan-1966  Age: 46 Years  PCP: Berniece Andreas [facility])  Pregnant: No   Symptoms  Reason For Call & Symptoms: dog bite  Reviewed Health History In EMR: Yes  Reviewed Medications In EMR: Yes  Reviewed Allergies In EMR: Yes  Reviewed Surgeries / Procedures: Yes  Date of Onset of Symptoms: 01/29/2012 OB:  LMP: Unknown  Guideline(s) Used:  Animal Bite  Disposition Per Guideline:   Go to Office Now  Reason For Disposition Reached:   Puncture wound or small cut on hands or genitals  Advice Given:  N/A  Office Follow Up:  Does the office need to follow up with this patient?: No  Instructions For The Office: N/A  Appointment Scheduled:  01/29/2012 16:00:00 Appointment Scheduled Provider:  Kriste Basque (Family Practice)

## 2012-01-29 NOTE — Progress Notes (Signed)
No chief complaint on file.   HPI:  Acute visit for dog bite: -location/onset: this morning - playing with neighbors dog in [place] at her mom's house and got nicked with tooth while playing, no abnormal behavior from dog, thinks dog had all its shots, animal control was contacted  -dog contained and domestic? Yes, tags and shots, animal control notified per pt and already contacted pt -last tetanus booster: had tetanus booster 2 years ago -Bite is on right had, barely broke skin - cleaned very well with soap and water  ROS: See pertinent positives and negatives per HPI.  Past Medical History  Diagnosis Date  . ALLERGIC RHINITIS 11/07/2007  . BENIGN NEOPLASM OF SKIN SITE UNSPECIFIED 07/25/2009  . DEPRESSION 11/07/2007  . GERD 11/07/2007  . 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 Endometriosis Father   . Cancer Neg Hx     skin, renal   . Asthma Neg Hx     family hx  . Diabetes Neg Hx     family hx    History   Social History  . Marital Status: Married    Spouse Name: N/A    Number of Children: N/A  . Years of Education: N/A   Social History Main Topics  . Smoking status: Never Smoker   . Smokeless tobacco: Never Used  . Alcohol Use: Yes  . Drug Use: No  . Sexually Active: Not on file   Other Topics Concern  . 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  , Disp: , Rfl: ;  clonazePAM (KLONOPIN) 0.5 MG tablet, , Disp: , Rfl: ;  lamoTRIgine (LAMICTAL) 100 MG tablet, Take 100 mg by mouth daily.  , Disp: , Rfl: ;  lansoprazole (PREVACID) 15 MG capsule, Take 15 mg by mouth 2 (two) times daily.  , Disp: , Rfl:  olopatadine (PATANOL) 0.1 % ophthalmic solution, Place 1 drop into both eyes 2 (two) times daily., Disp: 5 mL, Rfl: 1  EXAM:  There were no vitals filed for this visit.  There is  no height or weight on file to calculate BMI.  GENERAL: vitals reviewed and listed above, alert, oriented, appears well hydrated and in no acute distress  HEENT: atraumatic, conjunttiva clear, no obvious abnormalities on inspection of external nose and ears  SKIN: very minor abrasion of skin on R knuckle of 3rd digit   MS: moves all extremities without noticeable abnormality  PSYCH: pleasant and cooperative, no obvious depression or anxiety  ASSESSMENT AND PLAN:  Discussed the following assessment and plan:  No diagnosis found. -given wound on hand with tx with augmentin - discussed risks and benefits(pt reports DOES NOT HAVE ALLERGY to this - just upset stomach) -animal should be confined and observed for 10 days - pt reports animal control has already contacted her and obtained address for dog -Patient advised to return or notify a doctor immediately if symptoms worsen or persist or new concerns arise.  There are no Patient Instructions on file for this visit.   Kriste Basque R.

## 2012-01-29 NOTE — Telephone Encounter (Signed)
No follow up required, closing encounter. °

## 2012-01-31 ENCOUNTER — Telehealth: Payer: Self-pay

## 2012-01-31 NOTE — Telephone Encounter (Signed)
Spoke with Bernadette Hoit from [facility] and she states that it is the protocol to fax Animal Control when a pt calls in about an animal bite. Dr. Selena Batten is aware.

## 2012-02-08 ENCOUNTER — Other Ambulatory Visit: Payer: Self-pay | Admitting: Internal Medicine

## 2012-02-08 DIAGNOSIS — Z1231 Encounter for screening mammogram for malignant neoplasm of breast: Secondary | ICD-10-CM

## 2012-03-21 ENCOUNTER — Ambulatory Visit: Payer: 59

## 2012-04-15 ENCOUNTER — Ambulatory Visit
Admission: RE | Admit: 2012-04-15 | Discharge: 2012-04-15 | Disposition: A | Discharge status: Home / Self Care | Payer: 59 | Source: Ambulatory Visit | Attending: Internal Medicine | Admitting: Internal Medicine

## 2012-04-15 DIAGNOSIS — Z1231 Encounter for screening mammogram for malignant neoplasm of breast: Secondary | ICD-10-CM

## 2012-04-22 ENCOUNTER — Encounter: Payer: Self-pay | Admitting: Internal Medicine

## 2012-04-22 ENCOUNTER — Ambulatory Visit (INDEPENDENT_AMBULATORY_CARE_PROVIDER_SITE_OTHER): Payer: 59 | Admitting: Internal Medicine

## 2012-04-22 VITALS — BP 112/60 | HR 93 | Temp 98.8°F | Wt 138.0 lb

## 2012-04-22 DIAGNOSIS — M62838 Other muscle spasm: Secondary | ICD-10-CM | POA: Insufficient documentation

## 2012-04-22 DIAGNOSIS — T50905S Adverse effect of unspecified drugs, medicaments and biological substances, sequela: Secondary | ICD-10-CM

## 2012-04-22 DIAGNOSIS — Z789 Other specified health status: Secondary | ICD-10-CM | POA: Insufficient documentation

## 2012-04-22 DIAGNOSIS — K219 Gastro-esophageal reflux disease without esophagitis: Secondary | ICD-10-CM | POA: Insufficient documentation

## 2012-04-22 MED ORDER — DEXLANSOPRAZOLE 30 MG PO CPDR: 30.0000 mg | DELAYED_RELEASE_CAPSULE | Freq: Every day | ORAL | Status: DC

## 2012-04-22 NOTE — Patient Instructions (Addendum)
Readdress     Your work station.     Try using the keyboard. Will do a pt referral.   Agree that dexilant seems to be  the best for you cause of drug interaction with the other PPIs.

## 2012-04-22 NOTE — Progress Notes (Signed)
Chief Complaint  Patient presents with  . Back Pain    Ongoing for 1 month.  Pain is worsening.  Karie Fetch Reflux    med refill hx of drug IA     HPI: Patient comes in today for SDA for  new problem evaluation. 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 Right    trapezious      Muscle spasm.  And also some tingling in the periscapular area. No weakness in her arms or legs she does have a history of lumbar disc disease takes Aleve once or twice a day. Her symptoms are not getting better with conservative measures is difficult as she drives into 5 allergies and still works on the weekend on her i pad.  Also requests a refill of dexilant: She had had a GI workup by Dr. Randa Evens and Dr. Norton Pastel in the past was felt to be reflux related. She did well on DEXALANT  however when taking omeprazole or Prevacid had drug and her actions with palpitations panic attacks side effects. Unfortunately DEXALANT  is a level III on her formulary. She hasn't seen GI or ENT recently but the medication seemed to work. If she doesn't take them she gets acid feeling in her throat ROS: See pertinent positives and negatives per HPI. No falling vision changes fever.   Past Medical History  Diagnosis Date  . ALLERGIC RHINITIS 11/07/2007  . BENIGN NEOPLASM OF SKIN SITE UNSPECIFIED 07/25/2009  . DEPRESSION 11/07/2007  . GERD 11/07/2007  . 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 Endometriosis Father   . Cancer Neg Hx     skin, renal   . Asthma Neg Hx     family hx  . Diabetes Neg Hx     family hx    History   Social History  . Marital Status: Married    Spouse Name: N/A    Number of Children: N/A  . Years of Education: N/A   Social History Main Topics  . Smoking status: Never Smoker   . Smokeless tobacco: Never Used  . Alcohol Use: Yes  . Drug Use: No  . Sexually  Active: None   Other Topics Concern  . 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 citalopram (CELEXA) 10 MG tablet Take 30 mg by mouth daily.        . clonazePAM (KLONOPIN) 0.5 MG tablet       . lamoTRIgine (LAMICTAL) 100 MG tablet Take 100 mg by mouth daily.        Marland Kitchen olopatadine (PATANOL) 0.1 % ophthalmic solution Place 1 drop into both eyes 2 (two) times daily.  5 mL  1  . Dexlansoprazole 30 MG capsule Take 1 capsule (30 mg total) by mouth daily.  30 capsule  5  . [DISCONTINUED] amoxicillin-clavulanate (AUGMENTIN) 875-125 MG per tablet Take 1 tablet by mouth 2 (two) times daily.  20 tablet  0  . [DISCONTINUED] lansoprazole (PREVACID) 15 MG capsule Take 15 mg by mouth 2 (two) times daily.         No facility-administered encounter medications on file as of 04/22/2012.    EXAM:  BP 112/60  Pulse 93  Temp(Src) 98.8 F (37.1 C) (Oral)  Wt 138 lb (62.596 kg)  BMI 21.13 kg/m2  SpO2 98%  LMP 04/02/2012  Body mass index is 21.13 kg/(m^2).  GENERAL: vitals reviewed and listed above, alert, oriented, appears well hydrated and in no acute distress  HEENT: atraumatic, conjunctiva  clear, no obvious abnormalities on inspection of external nose and ears OP : no lesion edema or exudate   NECK: no obvious masses on inspection palpation  no midline tenderness a bit foreshortened posture very tight right trapezius neurologically intact  LUNGS: clear to auscultation bilaterally, no wheezes, rales or rhonchi, good air movement  CV: HRRR, no clubbing cyanosis or  peripheral edema nl cap refill  Neurologic cranial nerves III through XII are grossly intact DTRs are brisk throughout but no clonus. There is no obvious weakness. Shoulder within normal limits and good range of motion. Back shows some very minimal low thoracic lumbar scoliosis. There is no  obvious winging MS: moves all extremities without noticeable focal  abnormality PSYCH: pleasant and cooperative, no obvious depression or anxiety  ASSESSMENT AND PLAN:  Discussed the following assessment and plan:  Muscle spasms of neck trapezious - With periscapular symptoms probably related to work station plan physical therapy referral hopefully positional changes and exercises will help. - Plan: Ambulatory referral to Physical Therapy  GERD (gastroesophageal reflux disease) - With esophageal laryngeal component diagnosed by ENT. also taking aleve 1-2 per day for back predicament  Drug interaction, sequela - hx of panic palpitations with ppi and celexa    rx for dexilant  uncertain about insurance coverage but this is medically appropriate.  -Patient advised to return or notify health care team  if symptoms worsen or persist or new concerns arise.  Patient Instructions  Readdress     Your work station.     Try using the keyboard. Will do a pt referral.   Agree that dexilant seems to be  the best for you cause of drug interaction with the other PPIs.         Neta Mends. Panosh M.D.

## 2012-04-24 ENCOUNTER — Ambulatory Visit: Payer: 59 | Attending: Internal Medicine

## 2012-04-24 DIAGNOSIS — IMO0001 Reserved for inherently not codable concepts without codable children: Secondary | ICD-10-CM | POA: Insufficient documentation

## 2012-04-24 DIAGNOSIS — M542 Cervicalgia: Secondary | ICD-10-CM | POA: Insufficient documentation

## 2012-04-24 DIAGNOSIS — M62838 Other muscle spasm: Secondary | ICD-10-CM | POA: Insufficient documentation

## 2012-04-29 ENCOUNTER — Ambulatory Visit: Payer: 59 | Admitting: Physical Therapy

## 2012-05-01 ENCOUNTER — Ambulatory Visit: Payer: 59 | Admitting: Physical Therapy

## 2012-05-06 ENCOUNTER — Ambulatory Visit: Payer: 59

## 2012-05-09 ENCOUNTER — Ambulatory Visit: Payer: 59

## 2012-05-13 ENCOUNTER — Ambulatory Visit: Payer: 59 | Admitting: Physical Therapy

## 2012-05-15 ENCOUNTER — Ambulatory Visit: Payer: 59 | Admitting: Physical Therapy

## 2012-05-20 ENCOUNTER — Ambulatory Visit: Payer: 59 | Attending: Internal Medicine | Admitting: Physical Therapy

## 2012-05-20 DIAGNOSIS — M542 Cervicalgia: Secondary | ICD-10-CM | POA: Insufficient documentation

## 2012-05-20 DIAGNOSIS — IMO0001 Reserved for inherently not codable concepts without codable children: Secondary | ICD-10-CM | POA: Insufficient documentation

## 2012-05-20 DIAGNOSIS — M62838 Other muscle spasm: Secondary | ICD-10-CM | POA: Insufficient documentation

## 2012-05-22 ENCOUNTER — Ambulatory Visit: Payer: 59

## 2012-05-26 ENCOUNTER — Ambulatory Visit: Payer: 59 | Admitting: Physical Therapy

## 2012-05-27 ENCOUNTER — Ambulatory Visit: Payer: 59 | Admitting: Physical Therapy

## 2012-05-28 ENCOUNTER — Ambulatory Visit: Payer: 59

## 2012-06-03 ENCOUNTER — Ambulatory Visit: Payer: 59 | Admitting: Physical Therapy

## 2012-06-05 ENCOUNTER — Ambulatory Visit: Payer: 59

## 2012-06-10 ENCOUNTER — Ambulatory Visit: Payer: 59 | Admitting: Physical Therapy

## 2012-06-17 ENCOUNTER — Ambulatory Visit: Payer: 59 | Admitting: Physical Therapy

## 2012-06-19 ENCOUNTER — Ambulatory Visit: Payer: 59 | Attending: Internal Medicine

## 2012-06-19 DIAGNOSIS — IMO0001 Reserved for inherently not codable concepts without codable children: Secondary | ICD-10-CM | POA: Insufficient documentation

## 2012-06-19 DIAGNOSIS — M542 Cervicalgia: Secondary | ICD-10-CM | POA: Insufficient documentation

## 2012-06-19 DIAGNOSIS — M62838 Other muscle spasm: Secondary | ICD-10-CM | POA: Insufficient documentation

## 2012-10-07 ENCOUNTER — Ambulatory Visit (INDEPENDENT_AMBULATORY_CARE_PROVIDER_SITE_OTHER): Payer: 59 | Admitting: Internal Medicine

## 2012-10-07 ENCOUNTER — Encounter: Payer: Self-pay | Admitting: Internal Medicine

## 2012-10-07 VITALS — BP 106/60 | HR 80 | Temp 98.2°F | Wt 137.0 lb

## 2012-10-07 DIAGNOSIS — N760 Acute vaginitis: Secondary | ICD-10-CM

## 2012-10-07 MED ORDER — METRONIDAZOLE 500 MG PO TABS: 500.0000 mg | ORAL_TABLET | Freq: Two times a day (BID) | ORAL | Status: DC

## 2012-10-07 NOTE — Progress Notes (Signed)
Chief Complaint  Patient presents with  . Vaginal Itching    Started 3 days ago.    HPI: Patient comes in today for SDA for  new problem evaluation. Think she has a problem that would get better with metronidazole. Has history of vaginal itching in the summer with irritation the response to metronidazole. Has also had yeast infections in the past but this is different because it isn't white and clumpy. No specific odor or significant discharge No recent antibiotic 3 lmp august  10th  Is up-to-date on seeing her GYN. ROS: See pertinent positives and negatives per HPI. No abdominal pain fever or UTI symptoms no exposure risk.  Past Medical History  Diagnosis Date  . ALLERGIC RHINITIS 11/07/2007  . BENIGN NEOPLASM OF SKIN SITE UNSPECIFIED 07/25/2009  . DEPRESSION 11/07/2007  . GERD 11/07/2007  . 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 Endometriosis Father   . Cancer Neg Hx     skin, renal   . Asthma Neg Hx     family hx  . Diabetes Neg Hx     family hx    History   Social History  . Marital Status: Married    Spouse Name: N/A    Number of Children: N/A  . Years of Education: N/A   Social History Main Topics  . Smoking status: Never Smoker   . Smokeless tobacco: Never Used  . Alcohol Use: Yes  . Drug Use: No  . Sexual Activity: None   Other Topics Concern  . None   Social History Narrative   Married  Human resources officer    Works for [employer]    Non smoker     Outpatient Encounter Prescriptions as of 10/07/2012  Medication Sig Dispense Refill  . citalopram (CELEXA) 10 MG tablet Take 30 mg by mouth daily.        . clonazePAM (KLONOPIN) 0.5 MG tablet       . Dexlansoprazole 30 MG capsule Take 1 capsule (30 mg total) by mouth daily.  30 capsule  5  . lamoTRIgine (LAMICTAL) 100 MG tablet Take 100 mg by mouth daily.        . metroNIDAZOLE (FLAGYL) 500 MG tablet Take 1 tablet (500 mg total) by mouth 2 (two) times daily.  14 tablet  0    No facility-administered encounter medications on file as of 10/07/2012.    EXAM:  BP 106/60  Pulse 80  Temp(Src) 98.2 F (36.8 C) (Oral)  Wt 137 lb (62.143 kg)  BMI 20.98 kg/m2  SpO2 98%  LMP 09/28/2012  Body mass index is 20.98 kg/(m^2).  GENERAL: vitals reviewed and listed above, alert, oriented, appears well hydrated and in no acute distress HEENT: atraumatic, conjunctiva  clear, no obvious abnormalities on inspection of external nose and ears  Abdomen soft without obvious masses External GU mild redness no lesion vaginal exam cause clear mucus scant white gray discharge no lesions.  Lymph nodes negative adenopathy MS: moves all extremities without noticeable focal  abnormality PSYCH: pleasant and cooperative, no obvious depression or anxiety  ASSESSMENT AND PLAN:  Discussed the following assessment and plan:  Vaginitis and vulvovaginitis - Uncertain cause but similar symptoms with BV in response to metronidazole appeared treatment today follow up persist or progress orsee GYN  -Patient advised to return or notify health care team  if symptoms worsen or persist or new concerns arise.  Patient Instructions  Exam is good  Treat for  bacterial vaginosis. Fu or ask your gyne if  persistent or progressive .    Bacterial Vaginosis Bacterial vaginosis (BV) is a vaginal infection where the normal balance of bacteria in the vagina is disrupted. The normal balance is then replaced by an overgrowth of certain bacteria. There are several different kinds of bacteria that can cause BV. BV is the most common vaginal infection in women of childbearing age. CAUSES   The cause of BV is not fully understood. BV develops when there is an increase or imbalance of harmful bacteria.  Some activities or behaviors can upset the normal balance of bacteria in the vagina and put women at increased risk including:  Having a new sex partner or multiple sex partners.  Douching.  Using an  intrauterine device (IUD) for contraception.  It is not clear what role sexual activity plays in the development of BV. However, women that have never had sexual intercourse are rarely infected with BV. Women do not get BV from toilet seats, bedding, swimming pools or from touching objects around them.  SYMPTOMS   Grey vaginal discharge.  A fish-like odor with discharge, especially after sexual intercourse.  Itching or burning of the vagina and vulva.  Burning or pain with urination.  Some women have no signs or symptoms at all. DIAGNOSIS  Your caregiver must examine the vagina for signs of BV. Your caregiver will perform lab tests and look at the sample of vaginal fluid through a microscope. They will look for bacteria and abnormal cells (clue cells), a pH test higher than 4.5, and a positive amine test all associated with BV.  RISKS AND COMPLICATIONS   Pelvic inflammatory disease (PID).  Infections following gynecology surgery.  Developing HIV.  Developing herpes virus. TREATMENT  Sometimes BV will clear up without treatment. However, all women with symptoms of BV should be treated to avoid complications, especially if gynecology surgery is planned. Female partners generally do not need to be treated. However, BV may spread between female sex partners so treatment is helpful in preventing a recurrence of BV.   BV may be treated with antibiotics. The antibiotics come in either pill or vaginal cream forms. Either can be used with nonpregnant or pregnant women, but the recommended dosages differ. These antibiotics are not harmful to the baby.  BV can recur after treatment. If this happens, a second round of antibiotics will often be prescribed.  Treatment is important for pregnant women. If not treated, BV can cause a premature delivery, especially for a pregnant woman who had a premature birth in the past. All pregnant women who have symptoms of BV should be checked and treated.  For  chronic reoccurrence of BV, treatment with a type of prescribed gel vaginally twice a week is helpful. HOME CARE INSTRUCTIONS   Finish all medication as directed by your caregiver.  Do not have sex until treatment is completed.  Tell your sexual partner that you have a vaginal infection. They should see their caregiver and be treated if they have problems, such as a mild rash or itching.  Practice safe sex. Use condoms. Only have 1 sex partner. PREVENTION  Basic prevention steps can help reduce the risk of upsetting the natural balance of bacteria in the vagina and developing BV:  Do not have sexual intercourse (be abstinent).  Do not douche.  Use all of the medicine prescribed for treatment of BV, even if the signs and symptoms go away.  Tell your sex partner if you have  BV. That way, they can be treated, if needed, to prevent reoccurrence. SEEK MEDICAL CARE IF:   Your symptoms are not improving after 3 days of treatment.  You have increased discharge, pain, or fever. MAKE SURE YOU:   Understand these instructions.  Will watch your condition.  Will get help right away if you are not doing well or get worse. FOR MORE INFORMATION  Division of STD Prevention (DSTDP), Centers for Disease Control and Prevention: [employer]Apps.co.za American Social Health Association (ASHA): www.ashastd.org  Document Released: 02/05/2005 Document Revised: 04/30/2011 Document Reviewed: 07/29/2008 [facility] Patient Information 2014 [place], [place].      Neta Mends. Raylei Losurdo M.D.

## 2012-10-07 NOTE — Patient Instructions (Signed)
Exam is good  Treat for bacterial vaginosis. Fu or ask your gyne if  persistent or progressive .    Bacterial Vaginosis Bacterial vaginosis (BV) is a vaginal infection where the normal balance of bacteria in the vagina is disrupted. The normal balance is then replaced by an overgrowth of certain bacteria. There are several different kinds of bacteria that can cause BV. BV is the most common vaginal infection in women of childbearing age. CAUSES   The cause of BV is not fully understood. BV develops when there is an increase or imbalance of harmful bacteria.  Some activities or behaviors can upset the normal balance of bacteria in the vagina and put women at increased risk including:  Having a new sex partner or multiple sex partners.  Douching.  Using an intrauterine device (IUD) for contraception.  It is not clear what role sexual activity plays in the development of BV. However, women that have never had sexual intercourse are rarely infected with BV. Women do not get BV from toilet seats, bedding, swimming pools or from touching objects around them.  SYMPTOMS   Grey vaginal discharge.  A fish-like odor with discharge, especially after sexual intercourse.  Itching or burning of the vagina and vulva.  Burning or pain with urination.  Some women have no signs or symptoms at all. DIAGNOSIS  Your caregiver must examine the vagina for signs of BV. Your caregiver will perform lab tests and look at the sample of vaginal fluid through a microscope. They will look for bacteria and abnormal cells (clue cells), a pH test higher than 4.5, and a positive amine test all associated with BV.  RISKS AND COMPLICATIONS   Pelvic inflammatory disease (PID).  Infections following gynecology surgery.  Developing HIV.  Developing herpes virus. TREATMENT  Sometimes BV will clear up without treatment. However, all women with symptoms of BV should be treated to avoid complications, especially if  gynecology surgery is planned. Female partners generally do not need to be treated. However, BV may spread between female sex partners so treatment is helpful in preventing a recurrence of BV.   BV may be treated with antibiotics. The antibiotics come in either pill or vaginal cream forms. Either can be used with nonpregnant or pregnant women, but the recommended dosages differ. These antibiotics are not harmful to the baby.  BV can recur after treatment. If this happens, a second round of antibiotics will often be prescribed.  Treatment is important for pregnant women. If not treated, BV can cause a premature delivery, especially for a pregnant woman who had a premature birth in the past. All pregnant women who have symptoms of BV should be checked and treated.  For chronic reoccurrence of BV, treatment with a type of prescribed gel vaginally twice a week is helpful. HOME CARE INSTRUCTIONS   Finish all medication as directed by your caregiver.  Do not have sex until treatment is completed.  Tell your sexual partner that you have a vaginal infection. They should see their caregiver and be treated if they have problems, such as a mild rash or itching.  Practice safe sex. Use condoms. Only have 1 sex partner. PREVENTION  Basic prevention steps can help reduce the risk of upsetting the natural balance of bacteria in the vagina and developing BV:  Do not have sexual intercourse (be abstinent).  Do not douche.  Use all of the medicine prescribed for treatment of BV, even if the signs and symptoms go away.  Tell your  sex partner if you have BV. That way, they can be treated, if needed, to prevent reoccurrence. SEEK MEDICAL CARE IF:   Your symptoms are not improving after 3 days of treatment.  You have increased discharge, pain, or fever. MAKE SURE YOU:   Understand these instructions.  Will watch your condition.  Will get help right away if you are not doing well or get worse. FOR  MORE INFORMATION  Division of STD Prevention (DSTDP), Centers for Disease Control and Prevention: [employer]Apps[employer].za American Social Health Association (ASHA): www.ashastd.org  Document Released: 02/05/2005 Document Revised: 04/30/2011 Document Reviewed: 07/29/2008 [facility] Patient Information 2014 [place], [place].

## 2012-10-09 ENCOUNTER — Other Ambulatory Visit: Payer: Self-pay | Admitting: Family Medicine

## 2012-10-24 ENCOUNTER — Other Ambulatory Visit: Payer: Self-pay | Admitting: Family Medicine

## 2012-10-30 ENCOUNTER — Telehealth: Payer: Self-pay | Admitting: Family Medicine

## 2012-10-30 NOTE — Telephone Encounter (Signed)
Has not been seen for this problem.  Has been seen acutely several times.  Please advise.  Thanks!! Would like a 90 day rx sent to [facility]

## 2012-11-26 NOTE — Telephone Encounter (Signed)
Per WP, need to call the pharmacy and find out who has been filling.

## 2012-11-28 ENCOUNTER — Other Ambulatory Visit: Payer: Self-pay | Admitting: Family Medicine

## 2012-11-28 ENCOUNTER — Telehealth: Payer: Self-pay | Admitting: Family Medicine

## 2012-11-28 MED ORDER — DEXLANSOPRAZOLE 30 MG PO CPDR: 30.0000 mg | DELAYED_RELEASE_CAPSULE | Freq: Every day | ORAL | Status: DC

## 2012-11-28 NOTE — Telephone Encounter (Signed)
Twice a day dosing should be directed by the GI or ENT doctors as I am not sure that  prior authorization will be approved for twice a day dosing.  I assume the insurance companies have specific criteria that is not in our record  Please advise how long she has been on twice a day dosing as we don't have that in our record. I

## 2012-11-28 NOTE — Telephone Encounter (Signed)
WP filled once.  Will send in.  Pt seen for this in March.

## 2012-11-28 NOTE — Telephone Encounter (Signed)
Patient would like rx sent to [facility].  Would like to change the sig from once daily to twice daily.  Okay to change?

## 2012-12-02 ENCOUNTER — Telehealth: Payer: Self-pay | Admitting: Internal Medicine

## 2012-12-02 NOTE — Telephone Encounter (Signed)
Patient called back and left a message on my machine.  She asked me to call her cell @ [phone].  Left a message for her to return my call.

## 2012-12-02 NOTE — Telephone Encounter (Signed)
Pt returned your call.  

## 2012-12-02 NOTE — Telephone Encounter (Signed)
Second phone message.  Not needed. See other note.

## 2012-12-02 NOTE — Telephone Encounter (Signed)
Left message on home and cell for the pt to return my call. 

## 2012-12-03 ENCOUNTER — Other Ambulatory Visit: Payer: Self-pay | Admitting: Family Medicine

## 2012-12-03 NOTE — Telephone Encounter (Signed)
Spoke to the pt.  Directed her to call her GI or ENT physician to increase her dose.  Informed her a rx was sent to [facility] on 11/28/12 and was received.

## 2013-05-04 ENCOUNTER — Ambulatory Visit (INDEPENDENT_AMBULATORY_CARE_PROVIDER_SITE_OTHER): Payer: BC Managed Care – PPO | Admitting: Gynecology

## 2013-05-04 ENCOUNTER — Encounter: Payer: Self-pay | Admitting: Gynecology

## 2013-05-04 VITALS — BP 96/64 | HR 79 | Resp 16 | Ht 66.0 in | Wt 140.0 lb

## 2013-05-04 DIAGNOSIS — Z01419 Encounter for gynecological examination (general) (routine) without abnormal findings: Secondary | ICD-10-CM

## 2013-05-04 DIAGNOSIS — Z0000 Encounter for general adult medical examination without abnormal findings: Secondary | ICD-10-CM

## 2013-05-04 LAB — POCT URINALYSIS DIPSTICK
Bilirubin, UA: NEGATIVE
Blood, UA: NEGATIVE
Glucose, UA: NEGATIVE
Ketones, UA: NEGATIVE
Nitrite, UA: NEGATIVE
Protein, UA: NEGATIVE
Urobilinogen, UA: NEGATIVE
pH, UA: 5

## 2013-05-04 NOTE — Patient Instructions (Signed)

## 2013-05-04 NOTE — Progress Notes (Signed)
48 y.o. Married Caucasian female   G0P0 here for annual exam. Pt is not currently sexually active.  Husband with Pyrones disease, and impotence.  No sex for over 2y.  Cycles remain regular.    Patient's last menstrual period was 04/17/2013.          Sexually active: no  The current method of family planning is vasectomy.    Exercising: no  The patient does not participate in regular exercise at present. Last pap: 02/2011 Neg. High Risk HPV: Neg Alcohol: one drink a month. Tobacco: never BSE: none MMG: BI-RADS1: NEG.03/2012    Health Maintenance  Topic Date Due  . Influenza Vaccine  09/19/2012  . Pap Smear  08/10/2014  . Tetanus/tdap  02/19/2021    Family History  Problem Relation Age of Onset  . Cancer Neg Hx     skin, renal   . Asthma Neg Hx     family hx  . Diabetes Neg Hx     family hx  . Endometriosis Mother     Patient Active Problem List   Diagnosis Date Noted  . Muscle spasms of neck trapezious 04/22/2012  . GERD (gastroesophageal reflux disease) 04/22/2012  . Drug interaction 04/22/2012  . Pelvic pain 08/10/2011  . Vaginitis 08/10/2011  . Abdominal cramps 08/08/2011  . Itching in the vaginal area 08/08/2011  . BENIGN NEOPLASM OF SKIN SITE UNSPECIFIED 07/25/2009  . DEPRESSION 11/07/2007  . ALLERGIC RHINITIS 11/07/2007  . 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 GERD 11/07/2007    Past Medical History  Diagnosis Date  . ALLERGIC RHINITIS 11/07/2007  . BENIGN NEOPLASM OF SKIN SITE UNSPECIFIED 07/25/2009  . DEPRESSION 11/07/2007  . GERD 11/07/2007  . Oral aphthae 11/07/2007    Past Surgical History  Procedure Laterality Date  . Wisdom tooth extraction    . Tonsillectomy and adenoidectomy      Allergies: Amoxicillin and Erythromycin  Current Outpatient Prescriptions  Medication Sig Dispense Refill  . citalopram (CELEXA) 10 MG tablet Take 30 mg by mouth daily.        . clonazePAM (KLONOPIN) 0.5 MG tablet Take 0.5 mg by mouth once a week.       Marland Kitchen Dexlansoprazole 30  MG capsule Take 1 capsule (30 mg total) by mouth daily.  90 capsule  0  . lamoTRIgine (LAMICTAL) 100 MG tablet Take 100 mg by mouth daily.         No current facility-administered medications for this visit.    ROS: Pertinent items are noted in HPI.  Exam:    BP 96/64  Pulse 79  Resp 16  Ht 5\' 6"  (1.676 m)  Wt 140 lb (63.504 kg)  BMI 22.61 kg/m2  LMP 04/17/2013 Weight change: @WEIGHTCHANGE @ Last 3 height recordings:  Ht Readings from Last 3 Encounters:  05/04/13 5\' 6"  (1.676 m)  01/25/11 5' 7.75" (1.721 m)  07/25/09 5' 6.75" (1.695 m)   General appearance: alert, cooperative and appears stated age Head: Normocephalic, without obvious abnormality, atraumatic Neck: no adenopathy, no carotid bruit, no JVD, supple, symmetrical, trachea midline and thyroid not enlarged, symmetric, no tenderness/mass/nodules Lungs: clear to auscultation bilaterally Breasts: normal appearance, no masses or tenderness Heart: regular rate and rhythm, S1, S2 normal, no murmur, click, rub or gallop Abdomen: soft, non-tender; bowel sounds normal; no masses,  no organomegaly Extremities: extremities normal, atraumatic, no cyanosis or edema Skin: Skin color, texture, turgor normal. No rashes or lesions Lymph nodes: Cervical, supraclavicular, and axillary nodes normal. no inguinal nodes palpated Neurologic: Grossly  normal   Pelvic: External genitalia:  no lesions              Urethra: normal appearing urethra with no masses, tenderness or lesions              Bartholins and Skenes: normal                 Vagina: normal appearing vagina with normal color and discharge, no lesions              Cervix: normal appearance              Pap taken: no        Bimanual Exam:  Uterus:  uterus is normal size, shape, consistency and nontender                                      Adnexa:    normal adnexa in size, nontender and no masses                                      Rectovaginal: Confirms                                       Anus:  normal sphincter tone, no lesions  A: well woman bipolar     P: mammogram pap smear guidelines Seeing psych re bipolar counseled on breast self exam, mammography screening, adequate intake of calcium and vitamin D, diet and exercise return annually or prn   An After Visit Summary was printed and given to the patient.

## 2013-05-04 NOTE — Progress Notes (Deleted)
48 y.o. @[employer]  @{Race/ethnicity:[id number]} female   G0P0 here for annual exam. Pt is not currently sexually active.        Health Maintenance  Topic Date Due  . Tetanus/tdap  09/12/1986  . Influenza Vaccine  09/19/2012  . Pap Smear  08/10/2014    Family History  Problem Relation Age of Onset  . Cancer Neg Hx     skin, renal   . Asthma Neg Hx     family hx  . Diabetes Neg Hx     family hx  . Endometriosis Mother     Patient Active Problem List   Diagnosis Date Noted  . Muscle spasms of neck trapezious 04/22/2012  . GERD (gastroesophageal reflux disease) 04/22/2012  . Drug interaction 04/22/2012  . Pelvic pain 08/10/2011  . Vaginitis 08/10/2011  . Abdominal cramps 08/08/2011  . Itching in the vaginal area 08/08/2011  . BENIGN NEOPLASM OF SKIN SITE UNSPECIFIED 07/25/2009  . DEPRESSION 11/07/2007  . ALLERGIC RHINITIS 11/07/2007  . 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 GERD 11/07/2007    Past Medical History  Diagnosis Date  . ALLERGIC RHINITIS 11/07/2007  . BENIGN NEOPLASM OF SKIN SITE UNSPECIFIED 07/25/2009  . DEPRESSION 11/07/2007  . GERD 11/07/2007  . Oral aphthae 11/07/2007    Past Surgical History  Procedure Laterality Date  . Wisdom tooth extraction    . Tonsillectomy and adenoidectomy      Allergies: Amoxicillin and Erythromycin  Current Outpatient Prescriptions  Medication Sig Dispense Refill  . citalopram (CELEXA) 10 MG tablet Take 30 mg by mouth daily.        . clonazePAM (KLONOPIN) 0.5 MG tablet Take 0.5 mg by mouth once a week.       Marland Kitchen Dexlansoprazole 30 MG capsule Take 1 capsule (30 mg total) by mouth daily.  90 capsule  0  . lamoTRIgine (LAMICTAL) 100 MG tablet Take 100 mg by mouth daily.         No current facility-administered medications for this visit.    ROS: {Ros - complete:30496}  Exam:    BP 96/64  Pulse 79  Resp 16  Ht 5\' 6"  (1.676 m)  Wt 140 lb (63.504 kg)  BMI 22.61 kg/m2  LMP 04/17/2013 Weight change: @WEIGHTCHANGE @ Last 3 height  recordings:  Ht Readings from Last 3 Encounters:  05/04/13 5\' 6"  (1.676 m)  01/25/11 5' 7.75" (1.721 m)  07/25/09 5' 6.75" (1.695 m)   General appearance: alert, cooperative and appears stated age Head: Normocephalic, without obvious abnormality, atraumatic Neck: no adenopathy, no carotid bruit, no JVD, supple, symmetrical, trachea midline and thyroid not enlarged, symmetric, no tenderness/mass/nodules Lungs: clear to auscultation bilaterally Breasts: normal appearance, no masses or tenderness Heart: regular rate and rhythm, S1, S2 normal, no murmur, click, rub or gallop Abdomen: soft, non-tender; bowel sounds normal; no masses,  no organomegaly Extremities: extremities normal, atraumatic, no cyanosis or edema Skin: Skin color, texture, turgor normal. No rashes or lesions Lymph nodes: Cervical, supraclavicular, and axillary nodes normal. no inguinal nodes palpated Neurologic: Grossly normal   Pelvic: External genitalia:  {Exam; genitalia female:32129}              Urethra: {urethra:311719::"not indicated"}              Bartholins and Skenes: {EXAM; GYN NOMVE:72094}                 Vagina: {vagina:315903::"normal appearing vagina with normal color and discharge, no lesions"}  Cervix: {exam; gyn cervix:30847}              Pap taken: {yes no:314532}        Bimanual Exam:  Uterus:  {uterus:315905::"uterus is normal size, shape, consistency and nontender"}                                      Adnexa:    {adnexa:311645::"not indicated"}                                      Rectovaginal: {Rectovaginal:16320}                                      Anus:  {Exam; anus:16940}  A: {Gyn assessment:5268::"well woman"} Contraceptive management     P: {plan; gyn:5269::"mammogram","pap smear","return annually or prn"}   An After Visit Summary was printed and given to the patient.

## 2013-06-05 ENCOUNTER — Encounter: Payer: Self-pay | Admitting: Internal Medicine

## 2013-06-05 ENCOUNTER — Ambulatory Visit (INDEPENDENT_AMBULATORY_CARE_PROVIDER_SITE_OTHER): Payer: BC Managed Care – PPO | Admitting: Internal Medicine

## 2013-06-05 VITALS — BP 118/74 | Temp 98.8°F | Ht 66.25 in | Wt 140.0 lb

## 2013-06-05 DIAGNOSIS — J309 Allergic rhinitis, unspecified: Secondary | ICD-10-CM

## 2013-06-05 DIAGNOSIS — R42 Dizziness and giddiness: Secondary | ICD-10-CM

## 2013-06-05 MED ORDER — PREDNISONE 20 MG PO TABS
ORAL_TABLET | ORAL | Status: DC
Start: 2013-06-05 — End: 2013-10-21

## 2013-06-05 NOTE — Patient Instructions (Addendum)
This acts like congsetion and allergy  Sx.   Stay on the flonase . Ok to use antihistamine  Saline nose spray. Consider .  Allegra   claritin or possible zyrtec  For runny  And dizzy  Short acting decongestion  To avoid side effect .  Prednisone   Short term. to decrease inflammation .

## 2013-06-05 NOTE — Progress Notes (Signed)
Chief Complaint  Patient presents with  . Otalgia    Rt side.  Started yesterday.  Has also had dizziness, stuffy nose and headache.  . Dizziness    HPI: Patient comes in today for SDA for  new problem evaluation.  Ur congested  For a week and now aching right ear  But better .  Allergy  Tylenol "severe allergy " q  flponase started  2 days ago.  Head hurst no face. Dizziness  All the time.  Turning head.  For a day  spinniy likje postional no fallinf neuro sx some ha . No eye sx  ROS: See pertinent positives and negatives per HPI. No fever chills  No itching now  Nasal stuffiness better  Since it rained . lamictal inc dose recently but not a se of this . Has to drive 6 counties and doesn't feel safe doing this  Past Medical History  Diagnosis Date  . ALLERGIC RHINITIS 11/07/2007  . BENIGN NEOPLASM OF SKIN SITE UNSPECIFIED 07/25/2009  . DEPRESSION 11/07/2007  . GERD 11/07/2007  . 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 Cancer Neg Hx     skin, renal   . Asthma Neg Hx     family hx  . Diabetes Neg Hx     family hx  . Endometriosis Mother     History   Social History  . Marital Status: Married    Spouse Name: N/A    Number of Children: N/A  . Years of Education: N/A   Social History Main Topics  . Smoking status: Never Smoker   . Smokeless tobacco: Never Used  . Alcohol Use: Yes     Comment: 1 drink per month  . Drug Use: No  . Sexual Activity: Not Currently   Other Topics Concern  . 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    Non smoker     Outpatient Encounter Prescriptions as of 06/05/2013  Medication Sig  . citalopram (CELEXA) 10 MG tablet Take 30 mg by mouth daily.    . clonazePAM (KLONOPIN) 0.5 MG tablet Take 0.5 mg by mouth once a week.   . lamoTRIgine (LAMICTAL) 100 MG tablet Take 100 mg by mouth daily.    . lansoprazole (PREVACID) 30 MG capsule   . predniSONE (DELTASONE)  20 MG tablet Take 3 po qd for 2 days then 2 po qd for 3 days,or as directed  . [DISCONTINUED] Dexlansoprazole 30 MG capsule Take 1 capsule (30 mg total) by mouth daily.    EXAM:  BP 118/74  Temp(Src) 98.8 F (37.1 C) (Oral)  Ht 5' 6.25" (1.683 m)  Wt 140 lb (63.504 kg)  BMI 22.42 kg/m2  Body mass index is 22.42 kg/(m^2).  GENERAL: vitals reviewed and listed above, alert, oriented, appears well hydrated and in no acute distress looks congested and allergic  Cognition intact  HEENT: atraumatic, conjunctiva  clear, no obvious abnormalities on inspection of external nose and ears  Right tm grey nl lm 1 + wax no impaction left eac andtm nl nares congestion no face tendernessOP : no lesion edema or exudate  NECK: no obvious masses on inspection palpation no adenopathy LUNGS: clear to auscultation bilaterally, no wheezes, rales or rhonchi,  CV: HRRR, no clubbing cyanosis or  peripheral edema nl cap refill  MS: moves all extremities without noticeable focal  abnormality PSYCH: pleasant and  cooperative, no obvious depression or anxiety Neuro grossly non focal  Gait  Nl  Neg tremor or rhomberg  ASSESSMENT AND PLAN:  Discussed the following assessment and plan:  Allergic sinusitis  Dizzinesses - vertigo like prob from congestion no alarm features   Risk benefit of medication pred discussed.  -Patient advised to return or notify health care team  if symptoms worsen ,persist or new concerns arise.  Patient Instructions  This acts like congsetion and allergy  Sx.   Stay on the flonase . Ok to use antihistamine  Saline nose spray. Consider .  Allegra   claritin or possible zyrtec  For runny  And dizzy  Short acting decongestion  To avoid side effect .  Prednisone   Short term. to decrease inflammation .   Standley Brooking. Rinaldo Macqueen M.D.  Pre visit review using our clinic review tool, if applicable. No additional management support is needed unless otherwise documented below in the visit note.

## 2013-09-30 ENCOUNTER — Other Ambulatory Visit: Payer: Self-pay | Admitting: Gastroenterology

## 2013-09-30 DIAGNOSIS — K219 Gastro-esophageal reflux disease without esophagitis: Principal | ICD-10-CM

## 2013-09-30 DIAGNOSIS — IMO0001 Reserved for inherently not codable concepts without codable children: Secondary | ICD-10-CM

## 2013-10-02 ENCOUNTER — Ambulatory Visit
Admission: RE | Admit: 2013-10-02 | Discharge: 2013-10-02 | Disposition: A | Discharge status: Home / Self Care | Payer: BC Managed Care – PPO | Source: Ambulatory Visit | Attending: Gastroenterology | Admitting: Gastroenterology

## 2013-10-02 DIAGNOSIS — K219 Gastro-esophageal reflux disease without esophagitis: Principal | ICD-10-CM

## 2013-10-02 DIAGNOSIS — IMO0001 Reserved for inherently not codable concepts without codable children: Secondary | ICD-10-CM

## 2013-10-21 ENCOUNTER — Ambulatory Visit (INDEPENDENT_AMBULATORY_CARE_PROVIDER_SITE_OTHER): Payer: BC Managed Care – PPO | Admitting: Family Medicine

## 2013-10-21 ENCOUNTER — Encounter: Payer: Self-pay | Admitting: Family Medicine

## 2013-10-21 ENCOUNTER — Telehealth: Payer: Self-pay | Admitting: Internal Medicine

## 2013-10-21 VITALS — BP 150/60 | HR 80 | Temp 98.8°F | Wt 138.0 lb

## 2013-10-21 DIAGNOSIS — R000 Tachycardia, unspecified: Secondary | ICD-10-CM

## 2013-10-21 DIAGNOSIS — F411 Generalized anxiety disorder: Secondary | ICD-10-CM

## 2013-10-21 NOTE — Telephone Encounter (Signed)
Noted  

## 2013-10-21 NOTE — Patient Instructions (Signed)
EKG looks fine, no tachycardia. Doubt this is cardiac related.   This seems to be a flare of your anxiety/panic disorder/PTSD. Please go home and rest today, if this is inadequate to calm your nerves, please take either 1/2 or full klonopin x 1 and get some rest.   Follow up with your psychiatrist. She can be helpful for future needed restrictions on work.

## 2013-10-21 NOTE — Telephone Encounter (Signed)
Patient Information:  Caller Name: Lisa Perkins  Phone: [phone]  Patient: Lisa Perkins, Lisa Perkins  Gender: Female  DOB: June 15, 1965  Age: 48 Years  PCP: Shanon Ace [facility])  Pregnant: No  Office Follow Up:  Does the office need to follow up with this patient?: No  Instructions For The Office: N/A  RN Note:  Currently driving.  Tachycardia began at 0630 and is constant. Pulse 96 bpm.  BP 140/84 left arm while sitting.  Her usual BP 110/70.  Slight headache in right parietal area. Reports difficulty breathing while sitting; feels short of breath.  No audible wheezing or tachypnea heard by triager.  Pt said she is feeling anxious. Advised to see MD now per MD protocol for pts with ED dispositions if appropriate per nursing judgment provided appt time is 4 hrs or less from time of call for difficulty breathing per Heart Rate and Heartbeat Questions.  Symptoms  Reason For Call & Symptoms: Tachycardia since 0630  Reviewed Health History In EMR: Yes  Reviewed Medications In EMR: Yes  Reviewed Allergies In EMR: Yes  Reviewed Surgeries / Procedures: Yes  Date of Onset of Symptoms: 10/21/2013  Treatments Tried: Deep breathing, Klonapin  Treatments Tried Worked: No OB / GYN:  LMP: 10/07/2013  Guideline(s) Used:  Heart Rate and Heartbeat Questions  Disposition Per Guideline:   Go to ED Now  Reason For Disposition Reached:   Difficulty breathing  Advice Given:  Reassurance  Everybody has palpitations at some point in their lives. Sometimes it is simply a heightened awareness of the heart's normal beating.  Occasional extra heart beats are experienced by most everyone. Lack of sleep, stress, and caffeinated beverages can make palpitations worse.  Call Back If:  Chest pain, lightheadedness, or difficulty breathing occurs  Heart beating more than 130 beats / minute  More than 3 extra or skipped beats / minute  You become worse.  RN Overrode Recommendation:  Make Appointment  Per MD protocol when office is open and appt within 4 hrs per nursing judgement.  Appointment Scheduled:  10/21/2013 13:30:00 Appointment Scheduled Provider:  Garret Reddish
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
## 2013-10-28 ENCOUNTER — Other Ambulatory Visit: Payer: Self-pay

## 2013-10-28 DIAGNOSIS — Z1231 Encounter for screening mammogram for malignant neoplasm of breast: Secondary | ICD-10-CM

## 2013-10-29 ENCOUNTER — Ambulatory Visit
Admission: RE | Admit: 2013-10-29 | Discharge: 2013-10-29 | Disposition: A | Discharge status: Home / Self Care | Payer: BC Managed Care – PPO | Source: Ambulatory Visit

## 2013-10-29 DIAGNOSIS — Z1231 Encounter for screening mammogram for malignant neoplasm of breast: Secondary | ICD-10-CM

## 2013-12-07 ENCOUNTER — Telehealth: Payer: Self-pay | Admitting: Gynecology

## 2013-12-07 NOTE — Telephone Encounter (Signed)
Spoke with patient. Patient states that she has "flaky skin on my right nipple that is itchy and when I pulled some off it burns." Denies redness, swelling, pain, and discharge. Patient denies recently changing lotion, body wash, or laundry detergent. Advised patient will need to come in for evaluation of area. Patient is agreeable. Appointment schedule for tomorrow at 12:45pm with Lisa Perkins, [place]. Patient agreeable to date and time.  Routing to [employer], FNP  Cc: Dr.Lathrop  Routing to provider for final review. Patient agreeable to disposition. Will close encounter

## 2013-12-07 NOTE — Telephone Encounter (Signed)
Patient calling requesting to speak with nurse about "flaky skin on right nipple."

## 2013-12-08 ENCOUNTER — Ambulatory Visit (INDEPENDENT_AMBULATORY_CARE_PROVIDER_SITE_OTHER): Payer: BC Managed Care – PPO | Admitting: Nurse Practitioner

## 2013-12-08 ENCOUNTER — Encounter: Payer: Self-pay | Admitting: Nurse Practitioner

## 2013-12-08 VITALS — BP 112/66 | HR 72 | Ht 66.25 in | Wt 138.0 lb

## 2013-12-08 DIAGNOSIS — N6489 Other specified disorders of breast: Secondary | ICD-10-CM

## 2013-12-08 NOTE — Progress Notes (Signed)
   Subjective:   48 y.o. Married Caucasian female presents for evaluation of right breast 'flaky skin' . Onset of the symptoms was yesterday.   Patient sought evaluation because of dryness and after pulling the skin off the area at the nipple and was tender.  Contributing factors include family hx of breast cancer on father's side and nulliparous. Denies anorexia, chills, fatigue, fevers, malaise, weight loss. Patient denies history of trauma, bites, or injuries. Last mammogram was 10/29/13 and was normal.      Review of Systems Pertinent items are noted in HPI.@SUBJECTIVE    Objective:   @General  appearance: alert, cooperative and appears stated age Head: Normocephalic, without obvious abnormality, atraumatic Neck: no adenopathy, supple, symmetrical, trachea midline and thyroid not enlarged, symmetric, no tenderness/mass/nodules Back: negative, symmetric, no curvature. ROM normal. No CVA tenderness. Lungs: clear to auscultation bilaterally Breasts: normal appearance, no masses or tenderness, the area of flakiness was on the right nipple at 8:30- 9:00 position.  There is no area of dryness now, only 1 spot pin head size where previous skin had been pulled.  Looks like area is almost healed.  there is no nipple dicharge.  No lymph nodes, no areas of rash or bites Heart: regular rate and rhythm Abdomen: soft, non-tender; bowel sounds normal; no masses,  no organomegaly    Assessment:   ASSESSMENT:Patient is diagnosed with normal breast exam with an area that appears to be healing on the nipple.   Plan:   PLAN: The patient has a documented plan to follow with further care of any other areas that come back on the breast or evidence that there is a rash.

## 2013-12-08 NOTE — Progress Notes (Deleted)
Subjective:     Patient ID: Lisa Perkins, female   DOB: 02/28/1965, 48 y.o.   MRN: [id number]  HPI this  48 yo  Fe complans of    Spoke with patient. Patient states that she has "flaky skin on my right nipple that is itchy and when I pulled some off it burns." Denies redness, swelling, pain, and discharge. Patient denies recently changing lotion, body wash, or laundry detergent. Advised patient will need to come in for evaluation of area. Patient is agreeable. Appointment schedule for tomorrow at 12:45pm with Milford Cage, [place]. Patient agreeable to date and time.  Review of Systems     Objective:   Physical Exam     Assessment:     ***    Plan:     ***

## 2013-12-12 NOTE — Progress Notes (Signed)
Encounter reviewed by Dr. Maleigh Bagot Silva.  

## 2014-01-18 ENCOUNTER — Encounter: Payer: Self-pay | Admitting: Internal Medicine

## 2014-01-18 ENCOUNTER — Ambulatory Visit (INDEPENDENT_AMBULATORY_CARE_PROVIDER_SITE_OTHER): Payer: BC Managed Care – PPO | Admitting: Internal Medicine

## 2014-01-18 VITALS — BP 118/68 | Temp 99.3°F | Ht 66.25 in | Wt 139.0 lb

## 2014-01-18 DIAGNOSIS — M62838 Other muscle spasm: Secondary | ICD-10-CM

## 2014-01-18 DIAGNOSIS — M6248 Contracture of muscle, other site: Secondary | ICD-10-CM

## 2014-01-18 DIAGNOSIS — M7989 Other specified soft tissue disorders: Secondary | ICD-10-CM

## 2014-01-18 NOTE — Progress Notes (Signed)
Pre visit review using our clinic review tool, if applicable. No additional management support is needed unless otherwise documented below in the visit note.  Chief Complaint  Patient presents with  . Neck and Back Pain    X5Days.  Would like to go back to therapy.    HPI: Patient Lisa Perkins  comes in today for SDA for   problem evaluation. She has had the recent onset of) cervical pain going into to the trapezius she thinks it's related to work and working on the tablet. She works for [employer]  and does travel through 6 counties.   A while back PT was helpful and would like  To try again   Left pinky for 3-4 days swelling min tender and no  Redness  No trauma sore at base ? Could it be gout?  ROS: See pertinent positives and negatives per HPI.  Past Medical History  Diagnosis Date  . ALLERGIC RHINITIS 11/07/2007  . BENIGN NEOPLASM OF SKIN SITE UNSPECIFIED 07/25/2009  . DEPRESSION 11/07/2007  . GERD 11/07/2007  . 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 Cancer Neg Hx     skin, renal   . Asthma Neg Hx     family hx  . Diabetes Neg Hx     family hx  . Endometriosis Mother     History   Social History  . Marital Status: Married    Spouse Name: N/A    Number of Children: N/A  . Years of Education: N/A   Social History Main Topics  . Smoking status: Never Smoker   . Smokeless tobacco: Never Used  . Alcohol Use: Yes     Comment: 1 drink per month  . Drug Use: No  . Sexual Activity: Not Currently   Other Topics Concern  . 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    Non smoker     Outpatient Encounter Prescriptions as of 01/18/2014  Medication Sig  . citalopram (CELEXA) 10 MG tablet Take 30 mg by mouth daily.    . clonazePAM (KLONOPIN) 0.5 MG tablet Take 0.5 mg by mouth once a week.   . lamoTRIgine (LAMICTAL) 100 MG tablet Take 1 tablet by mouth daily.  . lansoprazole (PREVACID) 30 MG  capsule     EXAM:  BP 118/68 mmHg  Temp(Src) 99.3 F (37.4 C) (Oral)  Ht 5' 6.25" (1.683 m)  Wt 139 lb (63.05 kg)  BMI 22.26 kg/m2  Body mass index is 22.26 kg/(m^2).  GENERAL: vitals reviewed and listed above, alert, oriented, appears well hydrated and in no acute distress HEENT: atraumatic, conjunctiva  clear, no obvious abnormalities on inspection of external nose and ears NECK: no obvious masses on inspection palpation   Tender lright parastpinal area  No obv neuro findings  MS: moves all extremities without noticeable focal  Abnormality  lfer pinky with mradial prominence no rednses and good rom. dtrs nl  And brisk  Nl gait  PSYCH: pleasant and cooperative, no obvious depression or anxiety  ASSESSMENT AND PLAN:  Discussed the following assessment and plan:  Muscle spasms of neck trapezious - refer back to PT  work  aggravated , disc strategies  Swelling of finger, left - pinky no rednss  mild tenderness  observe and further eval if persistent progressive  -Patient advised to return or notify health care team  if symptoms worsen ,persist  or new concerns arise.  Patient Instructions  Will refer to pt  For the neck and back   brassfield .   Watch the left finger  Swelling   Gout is usually more painful  i f   persistent or progressive we can get an x ray and more evaluation.    Standley Brooking. Breland Trouten M.D.

## 2014-01-18 NOTE — Patient Instructions (Signed)
Will refer to pt  For the neck and back   brassfield .   Watch the left finger  Swelling   Gout is usually more painful  i f   persistent or progressive we can get an x ray and more evaluation.

## 2014-01-19 ENCOUNTER — Telehealth: Payer: Self-pay

## 2014-01-19 ENCOUNTER — Ambulatory Visit: Payer: BC Managed Care – PPO | Attending: Internal Medicine

## 2014-01-19 DIAGNOSIS — M542 Cervicalgia: Secondary | ICD-10-CM | POA: Insufficient documentation

## 2014-01-19 DIAGNOSIS — M62838 Other muscle spasm: Secondary | ICD-10-CM | POA: Insufficient documentation

## 2014-01-19 DIAGNOSIS — R5381 Other malaise: Secondary | ICD-10-CM | POA: Insufficient documentation

## 2014-01-19 NOTE — Telephone Encounter (Signed)
lmtcb to reschedule AEX with Dr. Lathrop 

## 2014-01-21 ENCOUNTER — Ambulatory Visit: Payer: BC Managed Care – PPO

## 2014-01-21 DIAGNOSIS — M542 Cervicalgia: Secondary | ICD-10-CM | POA: Diagnosis not present

## 2014-01-25 ENCOUNTER — Ambulatory Visit: Payer: BC Managed Care – PPO

## 2014-01-25 DIAGNOSIS — M542 Cervicalgia: Secondary | ICD-10-CM | POA: Diagnosis not present

## 2014-01-27 ENCOUNTER — Ambulatory Visit: Payer: BC Managed Care – PPO

## 2014-01-27 DIAGNOSIS — M542 Cervicalgia: Secondary | ICD-10-CM | POA: Diagnosis not present

## 2014-02-01 ENCOUNTER — Ambulatory Visit: Payer: BC Managed Care – PPO

## 2014-02-01 DIAGNOSIS — M542 Cervicalgia: Secondary | ICD-10-CM | POA: Diagnosis not present

## 2014-02-03 ENCOUNTER — Encounter: Payer: BC Managed Care – PPO | Admitting: Physical Therapy

## 2014-02-05 ENCOUNTER — Ambulatory Visit: Payer: BC Managed Care – PPO | Admitting: Physical Therapy

## 2014-02-05 DIAGNOSIS — M542 Cervicalgia: Secondary | ICD-10-CM | POA: Diagnosis not present

## 2014-02-08 ENCOUNTER — Ambulatory Visit: Payer: BC Managed Care – PPO

## 2014-02-08 DIAGNOSIS — M542 Cervicalgia: Secondary | ICD-10-CM | POA: Diagnosis not present

## 2014-02-10 ENCOUNTER — Ambulatory Visit: Payer: BC Managed Care – PPO

## 2014-02-10 DIAGNOSIS — M542 Cervicalgia: Secondary | ICD-10-CM | POA: Diagnosis not present

## 2014-02-16 ENCOUNTER — Ambulatory Visit: Payer: BC Managed Care – PPO

## 2014-02-18 ENCOUNTER — Ambulatory Visit: Payer: BC Managed Care – PPO | Admitting: Physical Therapy

## 2014-02-18 DIAGNOSIS — M542 Cervicalgia: Secondary | ICD-10-CM | POA: Diagnosis not present

## 2014-05-07 ENCOUNTER — Ambulatory Visit: Payer: Self-pay | Admitting: Gynecology

## 2014-06-11 ENCOUNTER — Encounter: Payer: Self-pay | Admitting: Internal Medicine

## 2014-06-11 ENCOUNTER — Ambulatory Visit (INDEPENDENT_AMBULATORY_CARE_PROVIDER_SITE_OTHER): Payer: 59 | Admitting: Internal Medicine

## 2014-06-11 VITALS — BP 134/82 | HR 82 | Temp 98.6°F | Ht 65.5 in | Wt 141.4 lb

## 2014-06-11 DIAGNOSIS — R058 Other specified cough: Secondary | ICD-10-CM

## 2014-06-11 DIAGNOSIS — R0602 Shortness of breath: Secondary | ICD-10-CM

## 2014-06-11 DIAGNOSIS — R05 Cough: Secondary | ICD-10-CM | POA: Diagnosis not present

## 2014-06-11 MED ORDER — ALBUTEROL SULFATE HFA 108 (90 BASE) MCG/ACT IN AERS: 2.0000 | INHALATION_SPRAY | Freq: Four times a day (QID) | RESPIRATORY_TRACT | Status: DC | PRN

## 2014-06-11 MED ORDER — BECLOMETHASONE DIPROPIONATE 40 MCG/ACT IN AERS: 2.0000 | INHALATION_SPRAY | Freq: Two times a day (BID) | RESPIRATORY_TRACT | Status: DC

## 2014-06-11 NOTE — Patient Instructions (Signed)
Sounds allergic like asthmatic reaction. Stay on  lfonase and poss antihistamine if helps .  Add rescue inhaler  ( per outside time) if having to use regulary 3-4 x per day for more than 5-7 days then begin the controller inhaler  Also  For at least a month throught the seaon.   If still not controlled plan ROV to discuss.

## 2014-06-11 NOTE — Progress Notes (Signed)
Pre visit review using our clinic review tool, if applicable. No additional management support is needed unless otherwise documented below in the visit note.   Chief Complaint  Patient presents with  . Cough  . Shortness of Breath    HPI: Patient Lisa Perkins  comes in today for SDA for  new problem evaluation. In past 3-4 days  Having sx when going outside   Lasting for 45 minutes or so  Thinks related to  pollen  Breathing inagin upper  Airway.  After a while feels tight cough  Upper allergy sx  Hx on meds  Rarely  Cough   Remote hx of allergic asthmatic sx as a child . No nocturnal sx  No exposures at this time otherwise. No ets . ROS: See pertinent positives and negatives per HPI. No cp sob otherwise   Fever hemoptysis  onmeds for reflux  No inc sx  Past Medical History  Diagnosis Date  . ALLERGIC RHINITIS 11/07/2007  . BENIGN NEOPLASM OF SKIN SITE UNSPECIFIED 07/25/2009  . DEPRESSION 11/07/2007  . GERD 11/07/2007  . 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 Cancer Neg Hx     skin, renal   . Asthma Neg Hx     family hx  . Diabetes Neg Hx     family hx  . Endometriosis Mother     History   Social History  . Marital Status: Married    Spouse Name: N/A  . Number of Children: N/A  . Years of Education: N/A   Social History Main Topics  . Smoking status: Never Smoker   . Smokeless tobacco: Never Used  . Alcohol Use: Yes     Comment: 1 drink per month  . Drug Use: No  . Sexual Activity: Not Currently   Other Topics Concern  . 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    Non smoker     Outpatient Encounter Prescriptions as of 06/11/2014  Medication Sig  . citalopram (CELEXA) 10 MG tablet Take 30 mg by mouth daily.    . clonazePAM (KLONOPIN) 0.5 MG tablet Take 0.5 mg by mouth once a week.   . lamoTRIgine (LAMICTAL) 100 MG tablet Take 1 tablet by mouth daily.  . lansoprazole (PREVACID)  30 MG capsule   . albuterol (PROVENTIL HFA;VENTOLIN HFA) 108 (90 BASE) MCG/ACT inhaler Inhale 2 puffs into the lungs every 6 (six) hours as needed for wheezing or shortness of breath.  . beclomethasone (QVAR) 40 MCG/ACT inhaler Inhale 2 puffs into the lungs 2 (two) times daily.    EXAM:  BP 134/82 mmHg  Pulse 82  Temp(Src) 98.6 F (37 C) (Oral)  Ht 5' 5.5" (1.664 m)  Wt 141 lb 6.4 oz (64.139 kg)  BMI 23.16 kg/m2  SpO2 99%  Body mass index is 23.16 kg/(m^2).  GENERAL: vitals reviewed and listed above, alert, oriented, appears well hydrated and in no acute distress HEENT: atraumatic, conjunctiva  clear, no obvious abnormalities on inspection of external nose and ears OP : no lesion edema or exudate  NECK: no obvious masses on inspection palpation  LUNGS: clear to auscultation bilaterally, no wheezes, rales or rhonchi, good air movement CV: HRRR, no clubbing cyanosis or  peripheral edema nl cap refill  MS: moves all extremities without noticeable focal  abnormality PSYCH: pleasant and cooperative, no obvious depression or anxiety  ASSESSMENT AND PLAN:  Discussed the following assessment and plan:  Shortness of breath - most likely allergic  rad issue hx asthma as a child and has had allergi otherwise   Allergic cough Exam reassuring  Sx occur when goes outside for a bit and  Limited  When in inside A/C ( we are  Peaking tree pollen season known  Reactants )  No evidence of infection now  Consider  Controller inhaler if ongoing  See instructions  Fu depending on course  Or improvments -Patient advised to return or notify health care team  if symptoms worsen ,persist or new concerns arise.  Patient Instructions  Sounds allergic like asthmatic reaction. Stay on  lfonase and poss antihistamine if helps .  Add rescue inhaler  ( per outside time) if having to use regulary 3-4 x per day for more than 5-7 days then begin the controller inhaler  Also  For at least a month throught the  seaon.   If still not controlled plan ROV to discuss.  Standley Brooking. Bernell Sigal M.D.

## 2014-10-19 ENCOUNTER — Ambulatory Visit: Payer: Self-pay | Admitting: Nurse Practitioner

## 2014-10-22 ENCOUNTER — Encounter: Payer: Self-pay | Admitting: Nurse Practitioner

## 2014-10-22 ENCOUNTER — Ambulatory Visit (INDEPENDENT_AMBULATORY_CARE_PROVIDER_SITE_OTHER): Payer: 59 | Admitting: Nurse Practitioner

## 2014-10-22 VITALS — BP 126/68 | HR 88 | Ht 65.75 in | Wt 141.0 lb

## 2014-10-22 DIAGNOSIS — Z0000 Encounter for general adult medical examination without abnormal findings: Secondary | ICD-10-CM

## 2014-10-22 DIAGNOSIS — Z01419 Encounter for gynecological examination (general) (routine) without abnormal findings: Secondary | ICD-10-CM

## 2014-10-22 LAB — CBC WITH DIFFERENTIAL/PLATELET
Basophils Absolute: 0 10*3/uL (ref 0.0–0.1)
Basophils Relative: 0 % (ref 0–1)
Eosinophils Absolute: 0.2 10*3/uL (ref 0.0–0.7)
Eosinophils Relative: 3 % (ref 0–5)
HCT: 28.3 % — ABNORMAL LOW (ref 36.0–46.0)
Hemoglobin: 8.6 g/dL — ABNORMAL LOW (ref 12.0–15.0)
Lymphocytes Relative: 23 % (ref 12–46)
Lymphs Abs: 1.8 10*3/uL (ref 0.7–4.0)
MCH: 23.6 pg — ABNORMAL LOW (ref 26.0–34.0)
MCHC: 30.4 g/dL (ref 30.0–36.0)
MCV: 77.5 fL — ABNORMAL LOW (ref 78.0–100.0)
MPV: 8.1 fL — ABNORMAL LOW (ref 8.6–12.4)
Monocytes Absolute: 0.6 10*3/uL (ref 0.1–1.0)
Monocytes Relative: 8 % (ref 3–12)
Neutro Abs: 5.1 10*3/uL (ref 1.7–7.7)
Neutrophils Relative %: 66 % (ref 43–77)
Platelets: 429 10*3/uL — ABNORMAL HIGH (ref 150–400)
RBC: 3.65 MIL/uL — ABNORMAL LOW (ref 3.87–5.11)
RDW: 17.2 % — ABNORMAL HIGH (ref 11.5–15.5)
WBC: 7.7 10*3/uL (ref 4.0–10.5)

## 2014-10-22 NOTE — Progress Notes (Signed)
Patient ID: Lisa Perkins, female   DOB: 1966-01-14, 49 y.o.   MRN: [id number] 49 y.o. G0 Married  Caucasian Fe here for annual exam.  Menses last 5 days moderate to light.    Patient's last menstrual period was 10/22/2014 (exact date).          Sexually active: No.  The current method of family planning is vasectomy      Exercising: No.  The patient does not participate in regular exercise at present. Smoker:  no  Health Maintenance: Pap:  08/10/11, Negative; 03/08/11, negative with neg HR HPV MMG:  10/29/13, Bi-Rads 1:  Negative TDaP:  01/26/10 Labs: done at [facility]   reports that she has never smoked. She has never used smokeless tobacco. She reports that she drinks alcohol. She reports that she does not use illicit drugs.  Past Medical History  Diagnosis Date  . ALLERGIC RHINITIS 11/07/2007  . BENIGN NEOPLASM OF SKIN SITE UNSPECIFIED 07/25/2009  . DEPRESSION 11/07/2007  . GERD 11/07/2007    LPR - laryngopharyngeal reflux  . 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 Asthma as child    Past Surgical History  Procedure Laterality Date  . Wisdom tooth extraction    . Tonsillectomy and adenoidectomy      Current Outpatient Prescriptions  Medication Sig Dispense Refill  . albuterol (PROVENTIL HFA;VENTOLIN HFA) 108 (90 BASE) MCG/ACT inhaler Inhale 2 puffs into the lungs every 6 (six) hours as needed for wheezing or shortness of breath. 1 Inhaler 2  . citalopram (CELEXA) 20 MG tablet Take 1.5 tablets by mouth daily.    . clonazePAM (KLONOPIN) 0.5 MG tablet Take 0.5 mg by mouth at bedtime as needed.     . lamoTRIgine (LAMICTAL) 100 MG tablet Take 1 tablet by mouth daily.    . lansoprazole (PREVACID) 30 MG capsule      No current facility-administered medications for this visit.    Family History  Problem Relation Age of Onset  . Asthma Neg Hx     family hx  . Endometriosis Mother   . Hypertension Mother   . Diabetes Maternal Uncle   . Breast cancer Paternal Aunt   . Diabetes Maternal Grandmother    . Hypertension Maternal Grandmother   . Stroke Maternal Grandmother   . Hypertension Maternal Grandfather   . Stroke Maternal Grandfather   . Stroke Paternal Grandmother   . Stroke Paternal Grandfather   . COPD Father   . Dementia Father     ROS:  Pertinent items are noted in HPI.  Otherwise, a comprehensive ROS was negative.  Exam:   BP 126/68 mmHg  Pulse 88  Ht 5' 5.75" (1.67 m)  Wt 141 lb (63.957 kg)  BMI 22.93 kg/m2  LMP 10/22/2014 (Exact Date) Height: 5' 5.75" (167 cm) Ht Readings from Last 3 Encounters:  10/22/14 5' 5.75" (1.67 m)  06/11/14 5' 5.5" (1.664 m)  01/18/14 5' 6.25" (1.683 m)    General appearance: alert, cooperative and appears stated age Head: Normocephalic, without obvious abnormality, atraumatic Neck: no adenopathy, supple, symmetrical, trachea midline and thyroid normal to inspection and palpation Lungs: clear to auscultation bilaterally Breasts: normal appearance, no masses or tenderness Heart: regular rate and rhythm Abdomen: soft, non-tender; no masses,  no organomegaly Extremities: extremities normal, atraumatic, no cyanosis or edema Skin: Skin color, texture, turgor normal. No rashes or lesions Lymph nodes: Cervical, supraclavicular, and axillary nodes normal. No abnormal inguinal nodes palpated Neurologic: Grossly normal   Pelvic: External genitalia:  no lesions  Urethra:  normal appearing urethra with no masses, tenderness or lesions              Bartholin's and Skene's: normal                 Vagina: normal appearing vagina with normal color and discharge, no lesions              Cervix: anteverted              Pap taken: Yes.   Bimanual Exam:  Uterus:  normal size, contour, position, consistency, mobility, non-tender              Adnexa: no mass, fullness, tenderness               Rectovaginal: Confirms               Anus:  normal sphincter tone, no lesions  Chaperone present: yes  A:  Well Woman with normal  exam  perimenopausal  P:   Reviewed health and wellness pertinent to exam  Pap smear as above  Mammogram is due 10/2014  Follow with CBC and pap  Counseled on breast self ex  She will get copies of labs done at psychiatrist  Counseled on mammography screening, adequate intake of calcium and vitamin D, diet and exercise return annually or prn  An After Visit Summary was printed and given to the patient.

## 2014-10-22 NOTE — Patient Instructions (Addendum)

## 2014-10-24 NOTE — Progress Notes (Signed)
Encounter reviewed by Dr. Brook Amundson C. Silva.  

## 2014-10-26 ENCOUNTER — Telehealth: Payer: Self-pay | Admitting: Nurse Practitioner

## 2014-10-26 ENCOUNTER — Other Ambulatory Visit: Payer: Self-pay | Admitting: Nurse Practitioner

## 2014-10-26 DIAGNOSIS — D509 Iron deficiency anemia, unspecified: Secondary | ICD-10-CM

## 2014-10-26 NOTE — Telephone Encounter (Signed)
Most iron supplements may give her the same abdominal complaints.  Have her to take Flinstone chewable tabs twice a day for a month.  Let us know if this is not tolerated.

## 2014-10-26 NOTE — Telephone Encounter (Signed)
Left message to call Kaitlyn at [phone]. 

## 2014-10-26 NOTE — Telephone Encounter (Signed)
Spoke with patient. Advised of results as seen below from Lisa Perkins, [place]. Patient states that she has taken Slow Fe in the past and had "Really bad stomach aches." Patient is requesting an alternative. States she is taking a multivitamin with iron in it daily but is unsure of how much iron is in the daily dose. Advised I will check with Lisa Cage, FNP regarding alternatives and return call. Patient is agreeable.  Notes Recorded by Kem Boroughs, FNP on 10/26/2014 at 8:27 AM Please let pt. Know that CBC reveals a very low HGB @ 8.6. She must get OTC Slow Fe and take BID for a month then lets recheck CBC. Order is placed for future lab.

## 2014-10-26 NOTE — Telephone Encounter (Signed)
Patient calling for results.

## 2014-10-26 NOTE — Telephone Encounter (Signed)
Returning call.

## 2014-10-26 NOTE — Telephone Encounter (Signed)
Spoke with patient. Advised of message as seen below from Kem Boroughs, [place]. Patient is agreeable and verbalizes understanding. One month lab recheck scheduled for 11/24/2014 at 9 a. Patient is agreeable to date and time.  Routing to provider for final review. Patient agreeable to disposition. Will close encounter.

## 2014-10-27 LAB — IPS PAP TEST WITH HPV

## 2014-11-24 ENCOUNTER — Other Ambulatory Visit (INDEPENDENT_AMBULATORY_CARE_PROVIDER_SITE_OTHER): Payer: 59

## 2014-11-24 DIAGNOSIS — D509 Iron deficiency anemia, unspecified: Secondary | ICD-10-CM

## 2014-11-24 LAB — CBC WITH DIFFERENTIAL/PLATELET
Basophils Absolute: 0 10*3/uL (ref 0.0–0.1)
Basophils Relative: 0 % (ref 0–1)
Eosinophils Absolute: 0.1 10*3/uL (ref 0.0–0.7)
Eosinophils Relative: 3 % (ref 0–5)
HCT: 30 % — ABNORMAL LOW (ref 36.0–46.0)
Hemoglobin: 9.4 g/dL — ABNORMAL LOW (ref 12.0–15.0)
Lymphocytes Relative: 29 % (ref 12–46)
Lymphs Abs: 1.4 10*3/uL (ref 0.7–4.0)
MCH: 25.5 pg — ABNORMAL LOW (ref 26.0–34.0)
MCHC: 31.3 g/dL (ref 30.0–36.0)
MCV: 81.3 fL (ref 78.0–100.0)
MPV: 8.4 fL — ABNORMAL LOW (ref 8.6–12.4)
Monocytes Absolute: 0.4 10*3/uL (ref 0.1–1.0)
Monocytes Relative: 9 % (ref 3–12)
Neutro Abs: 2.8 10*3/uL (ref 1.7–7.7)
Neutrophils Relative %: 59 % (ref 43–77)
Platelets: 343 10*3/uL (ref 150–400)
RBC: 3.69 MIL/uL — ABNORMAL LOW (ref 3.87–5.11)
RDW: 21.4 % — ABNORMAL HIGH (ref 11.5–15.5)
WBC: 4.7 10*3/uL (ref 4.0–10.5)

## 2014-11-30 ENCOUNTER — Telehealth: Payer: Self-pay | Admitting: Nurse Practitioner

## 2014-11-30 ENCOUNTER — Other Ambulatory Visit: Payer: Self-pay | Admitting: Nurse Practitioner

## 2014-11-30 DIAGNOSIS — D509 Iron deficiency anemia, unspecified: Secondary | ICD-10-CM

## 2014-11-30 NOTE — Telephone Encounter (Signed)
Patient is asking for recent cbc results. Last seen 11/24/14.

## 2014-11-30 NOTE — Telephone Encounter (Signed)
Patient calling to check on status of results °

## 2014-11-30 NOTE — Telephone Encounter (Signed)
Return call to patient. Left message to call back. See result note. Stephanie notified patient at 13.

## 2014-11-30 NOTE — Telephone Encounter (Signed)
Patient returned call. I called patient to follow-up with her regarding delay in giving her lab results. Patient was seen on 11-24-14. Advised lab results can take up to one week to get back to patient.  States she was able to speak with Colletta Maryland regarding results this morning.     Now states she is interested in possible iron injection as means to improve count faster. States she has fatigue, dizziness, palpitations and is working full time. Thinks she needs a "more expeditious" result. Upon questions, states she has not checked her pulse, also has anxiety and she is unsure if palpitations are related to anemia or anxiety. Sometimes has SOB with activity, like at the top of stairs in her home. Has to sit down at top of 10 stairs. Advised will review with Patty for additional instruction. If symptoms increase, particularly SOB without exertion or increased dizziness,  before we are able to respond, she should seek care at urgent care or ED.

## 2014-11-30 NOTE — Telephone Encounter (Signed)
After chart review with Dr. Sabra Heck we have concerns as to why she has anemia.  Patient gives no history of chronic anemia or history of menorrhagia.  She is given following recommendations:   Will get a consult with Dr. Marin Olp for iron infusion and evaluation of microcytic anemia.  Will get labs here first with iron studies so that will be done.  Will also schedule for her to have a colonoscopy and or EDG for evaluation of anemia with Dr. Collene Mares.

## 2014-11-30 NOTE — Telephone Encounter (Signed)
Returned call

## 2014-12-01 ENCOUNTER — Telehealth: Payer: Self-pay | Admitting: Nurse Practitioner

## 2014-12-01 ENCOUNTER — Other Ambulatory Visit (INDEPENDENT_AMBULATORY_CARE_PROVIDER_SITE_OTHER): Payer: 59

## 2014-12-01 DIAGNOSIS — D509 Iron deficiency anemia, unspecified: Secondary | ICD-10-CM

## 2014-12-01 LAB — IBC PANEL
%SAT: 37 % (ref 11–50)
TIBC: 386 ug/dL (ref 250–450)
UIBC: 243 ug/dL (ref 125–400)

## 2014-12-01 LAB — CBC WITH DIFFERENTIAL/PLATELET
Basophils Absolute: 0 10*3/uL (ref 0.0–0.1)
Basophils Relative: 0 % (ref 0–1)
Eosinophils Absolute: 0.1 10*3/uL (ref 0.0–0.7)
Eosinophils Relative: 2 % (ref 0–5)
HCT: 33.2 % — ABNORMAL LOW (ref 36.0–46.0)
Hemoglobin: 10.5 g/dL — ABNORMAL LOW (ref 12.0–15.0)
Lymphocytes Relative: 22 % (ref 12–46)
Lymphs Abs: 1.5 10*3/uL (ref 0.7–4.0)
MCH: 25.8 pg — ABNORMAL LOW (ref 26.0–34.0)
MCHC: 31.6 g/dL (ref 30.0–36.0)
MCV: 81.6 fL (ref 78.0–100.0)
MPV: 8.5 fL — ABNORMAL LOW (ref 8.6–12.4)
Monocytes Absolute: 0.5 10*3/uL (ref 0.1–1.0)
Monocytes Relative: 8 % (ref 3–12)
Neutro Abs: 4.6 10*3/uL (ref 1.7–7.7)
Neutrophils Relative %: 68 % (ref 43–77)
Platelets: 366 10*3/uL (ref 150–400)
RBC: 4.07 MIL/uL (ref 3.87–5.11)
RDW: 21.5 % — ABNORMAL HIGH (ref 11.5–15.5)
WBC: 6.7 10*3/uL (ref 4.0–10.5)

## 2014-12-01 LAB — IRON: Iron: 143 ug/dL (ref 40–190)

## 2014-12-01 LAB — FERRITIN: Ferritin: 10 ng/mL (ref 10–291)

## 2014-12-01 LAB — FOLATE: Folate: >20 ng/mL

## 2014-12-01 NOTE — Addendum Note (Signed)
Addended by: Abelino Derrick C on: 12/01/2014 09:30 AM   Modules accepted: Orders

## 2014-12-01 NOTE — Telephone Encounter (Signed)
OK to change to her GI preference

## 2014-12-01 NOTE — Telephone Encounter (Signed)
Left voicemail regarding referral appointment. The information is listed below. Should the patient need to cancel or reschedule this appointment, please advise them to call the office they've been referred to in order to reschedule.  Dr Collene Mares @ [facility] [address] [place],  [address] [phone]  12/02/14 @ 930am  Also, I have contacted Dr Antonieta Pert office at [facility]. They are aware of referral and are working on review and scheduling. Contact # [phone].

## 2014-12-01 NOTE — Telephone Encounter (Signed)
Spoke with patient - gave all appointment information. She states when she spoke with Ms Chong Sicilian she meant to tell her she already has a gastroenterologist, Dr Oletta Lamas. She stated she will call Dr Lorie Apley office to cancel and call her GI to schedule. Patient aware of status of Dr Marin Olp referral.  Routing to [place] for [facility]

## 2014-12-02 ENCOUNTER — Telehealth: Payer: Self-pay | Admitting: *Deleted

## 2014-12-02 NOTE — Telephone Encounter (Signed)
Patient is returning a call to [place]. Patient is aware Colletta Maryland is out of the office. Patient would like a call back from another nurse if possible.

## 2014-12-02 NOTE — Telephone Encounter (Signed)
I have attempted to contact this patient by phone with the following results: left message to return call to [place] at [phone] on answering machine (mobile per [facility]).  Name verified in message, advised call was regarding recent labs.  [phone] (Mobile) *Preferred*

## 2014-12-02 NOTE — Telephone Encounter (Signed)
Return call to patient. Notified of lab results and recommendation to continue with appointment as scheduled with Dr. Marin Olp, per Edman Circle FNP instruction. Encounter closed.

## 2014-12-02 NOTE — Telephone Encounter (Signed)
-----   Message from Kem Boroughs, [place] sent at 12/01/2014  4:33 PM EDT ----- Please let pt. Know that CBC this time shows a better HGB at 10.5 compared to 9.4 last week.  The iron level is normal, the IBC panel is normal, Ferritin and folate are normal.  Still needs evaluation for blood loss anemia.

## 2014-12-20 ENCOUNTER — Ambulatory Visit: Payer: 59

## 2014-12-20 ENCOUNTER — Ambulatory Visit: Payer: 59 | Admitting: Family

## 2014-12-20 ENCOUNTER — Other Ambulatory Visit: Payer: 59

## 2014-12-23 ENCOUNTER — Other Ambulatory Visit (HOSPITAL_BASED_OUTPATIENT_CLINIC_OR_DEPARTMENT_OTHER): Payer: 59

## 2014-12-23 ENCOUNTER — Ambulatory Visit (HOSPITAL_BASED_OUTPATIENT_CLINIC_OR_DEPARTMENT_OTHER): Payer: 59 | Admitting: Family

## 2014-12-23 ENCOUNTER — Encounter: Payer: Self-pay | Admitting: Family

## 2014-12-23 ENCOUNTER — Ambulatory Visit: Payer: 59

## 2014-12-23 ENCOUNTER — Ambulatory Visit (HOSPITAL_BASED_OUTPATIENT_CLINIC_OR_DEPARTMENT_OTHER): Payer: 59

## 2014-12-23 VITALS — BP 117/63 | HR 77 | Temp 98.2°F | Resp 20

## 2014-12-23 VITALS — BP 136/66 | HR 79 | Temp 98.0°F | Resp 16 | Ht 65.0 in | Wt 144.0 lb

## 2014-12-23 DIAGNOSIS — D500 Iron deficiency anemia secondary to blood loss (chronic): Secondary | ICD-10-CM

## 2014-12-23 DIAGNOSIS — F410 Panic disorder [episodic paroxysmal anxiety] without agoraphobia: Secondary | ICD-10-CM

## 2014-12-23 DIAGNOSIS — D509 Iron deficiency anemia, unspecified: Secondary | ICD-10-CM

## 2014-12-23 LAB — CBC WITH DIFFERENTIAL (CANCER CENTER ONLY)
BASO#: 0 10*3/uL (ref 0.0–0.2)
BASO%: 0.2 % (ref 0.0–2.0)
EOS%: 2.7 % (ref 0.0–7.0)
Eosinophils Absolute: 0.2 10*3/uL (ref 0.0–0.5)
HCT: 33.2 % — ABNORMAL LOW (ref 34.8–46.6)
HGB: 10.5 g/dL — ABNORMAL LOW (ref 11.6–15.9)
LYMPH#: 1.6 10*3/uL (ref 0.9–3.3)
LYMPH%: 25 % (ref 14.0–48.0)
MCH: 27.6 pg (ref 26.0–34.0)
MCHC: 31.6 g/dL — ABNORMAL LOW (ref 32.0–36.0)
MCV: 87 fL (ref 81–101)
MONO#: 0.7 10*3/uL (ref 0.1–0.9)
MONO%: 10.3 % (ref 0.0–13.0)
NEUT#: 3.9 10*3/uL (ref 1.5–6.5)
NEUT%: 61.8 % (ref 39.6–80.0)
Platelets: 289 10*3/uL (ref 145–400)
RBC: 3.8 10*6/uL (ref 3.70–5.32)
RDW: 20.2 % — ABNORMAL HIGH (ref 11.1–15.7)
WBC: 6.3 10*3/uL (ref 3.9–10.0)

## 2014-12-23 LAB — FERRITIN CHCC: Ferritin: 10 ng/ml (ref 9–269)

## 2014-12-23 LAB — IRON AND TIBC CHCC
%SAT: 22 % (ref 21–57)
Iron: 86 ug/dL (ref 41–142)
TIBC: 381 ug/dL (ref 236–444)
UIBC: 296 ug/dL (ref 120–384)

## 2014-12-23 LAB — CHCC SATELLITE - SMEAR

## 2014-12-23 MED ORDER — METHYLPREDNISOLONE SODIUM SUCC 125 MG IJ SOLR
120.0000 mg | Freq: Once | INTRAMUSCULAR | Status: AC
  Administered 2014-12-23: 120 mg via INTRAVENOUS

## 2014-12-23 MED ORDER — LORAZEPAM 0.5 MG PO TABS
0.5000 mg | ORAL_TABLET | Freq: Once | ORAL | Status: AC
  Administered 2014-12-23: 0.5 mg via ORAL

## 2014-12-23 MED ORDER — LORAZEPAM 0.5 MG PO TABS
ORAL_TABLET | ORAL | Status: AC
  Filled 2014-12-23: qty 1

## 2014-12-23 MED ORDER — SODIUM CHLORIDE 0.9 % IV SOLN
Freq: Once | INTRAVENOUS | Status: AC
  Administered 2014-12-23: 13:00:00 via INTRAVENOUS

## 2014-12-23 MED ORDER — SODIUM CHLORIDE 0.9 % IV SOLN
510.0000 mg | Freq: Once | INTRAVENOUS | Status: AC
  Administered 2014-12-23: 510 mg via INTRAVENOUS
  Filled 2014-12-23: qty 17

## 2014-12-23 NOTE — Progress Notes (Signed)
Hematology/Oncology Consultation   Name: Lisa Perkins      MRN: [id number]    Location: Room/bed info not found  Date: 12/23/2014 Time:4:09 PM   REFERRING PHYSICIAN: Antonietta Barcelona   REASON FOR CONSULT: Iron deficiency anemia    DIAGNOSIS:  Iron deficiency anemia   HISTORY OF PRESENT ILLNESS: Lisa Perkins is a very pleasant 49 yo white female with a recent history of iron deficiency anemia. She is symptomatic with fatigue, dizziness (no falls) frequent mouth sores (none at this time) and chewing ice. She has had some SOB with exertion.  She has been taking a Flintstone's vitamin with iron daily. She is also on Prevacid for GERD which blocks the absorption of iron in the gut.  Her mother is also anemic but goes untreated.  She still has her cycles and describes them as moderate and regular. No clots.  She has no children and has never been pregnant.  She has had no problems with infection. No fever, chills, n/v, cough, rash, chest pain, palpitations, abdominal pain or changes in bowel or bladder habits. No blood in her urine or stool.  No bruising or petechiae.  No lymphadenopathy found on exam.  She has had no swelling, tenderness, numbness or tingling in her extremities.  No personal cancer history. Family cancer history includes paternal aunt with breast cancer and maternal grandmother with kidney cancer.  She has a good appetite and is staying well hydrated. She denies any significant weight loss or gain.  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   ROS: All other 10 point review of systems is negative.   PAST MEDICAL HISTORY:   Past Medical History  Diagnosis Date  . ALLERGIC RHINITIS 11/07/2007  . BENIGN NEOPLASM OF SKIN SITE UNSPECIFIED 07/25/2009  . DEPRESSION 11/07/2007  . GERD 11/07/2007    LPR - laryngopharyngeal reflux  . 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 Asthma as child    ALLERGIES: Allergies  Allergen Reactions  . Amoxicillin     REACTION: nausea  gi signs  .  Erythromycin       MEDICATIONS:  Current Outpatient Prescriptions on File Prior to Visit  Medication Sig Dispense Refill  . albuterol (PROVENTIL HFA;VENTOLIN HFA) 108 (90 BASE) MCG/ACT inhaler Inhale 2 puffs into the lungs every 6 (six) hours as needed for wheezing or shortness of breath. 1 Inhaler 2  . citalopram (CELEXA) 20 MG tablet Take 1.5 tablets by mouth daily.    . clonazePAM (KLONOPIN) 0.5 MG tablet Take 0.5 mg by mouth at bedtime as needed.     . lamoTRIgine (LAMICTAL) 100 MG tablet Take 1 tablet by mouth daily.    . lansoprazole (PREVACID) 30 MG capsule      No current facility-administered medications on file prior to visit.     PAST SURGICAL HISTORY Past Surgical History  Procedure Laterality Date  . Wisdom tooth extraction    . Tonsillectomy and adenoidectomy      FAMILY HISTORY: Family History  Problem Relation Age of Onset  . Asthma Neg Hx     family hx  . Endometriosis Mother   . Hypertension Mother   . Diabetes Maternal Uncle   . Breast cancer Paternal Aunt   . Diabetes Maternal Grandmother   . Hypertension Maternal Grandmother   . Stroke Maternal Grandmother   . Hypertension Maternal Grandfather   . Stroke Maternal Grandfather   . Stroke Paternal Grandmother   . Stroke Paternal Grandfather   . COPD Father   .  Dementia Father     SOCIAL HISTORY:  reports that she has never smoked. She has never used smokeless tobacco. She reports that she drinks alcohol. She reports that she does not use illicit drugs.  PERFORMANCE STATUS: The patient's performance status is 1 - Symptomatic but completely ambulatory  PHYSICAL EXAM: Most Recent Vital Signs: Blood pressure 136/66, pulse 79, temperature 98 F (36.7 C), temperature source Oral, resp. rate 16, height _0  (1.651 m), weight 144 lb (65.318 kg). BP 136/66 mmHg  Pulse 79  Temp(Src) 98 F (36.7 C) (Oral)  Resp 16  Ht _1  (1.651 m)  Wt 144 lb (65.318 kg)  BMI 23.96 kg/m2  General Appearance:     Alert, cooperative, no distress, appears stated age  Head:    Normocephalic, without obvious abnormality, atraumatic  Eyes:    PERRL, conjunctiva/corneas clear, EOM's intact, fundi    benign, both eyes        Throat:   Lips, mucosa, and tongue normal; teeth and gums normal  Neck:   Supple, symmetrical, trachea midline, no adenopathy;    thyroid:  no enlargement/tenderness/nodules; no carotid   bruit or JVD  Back:     Symmetric, no curvature, ROM normal, no CVA tenderness  Lungs:     Clear to auscultation bilaterally, respirations unlabored  Chest Wall:    No tenderness or deformity   Heart:    Regular rate and rhythm, S1 and S2 normal, no murmur, rub   or gallop     Abdomen:     Soft, non-tender, bowel sounds active all four quadrants,    no masses, no organomegaly        Extremities:   Extremities normal, atraumatic, no cyanosis or edema  Pulses:   2+ and symmetric all extremities  Skin:   Skin color, texture, turgor normal, no rashes or lesions  Lymph nodes:   Cervical, supraclavicular, and axillary nodes normal  Neurologic:   CNII-XII intact, normal strength, sensation and reflexes    throughout    LABORATORY DATA:  Results for orders placed or performed in visit on 12/23/14 (from the past 48 hour(s))  CBC with Differential [facility])     Status: Abnormal   Collection Time: 12/23/14 10:10 AM  Result Value Ref Range   WBC 6.3 3.9 - 10.0 10e3/uL   RBC 3.80 3.70 - 5.32 10e6/uL   HGB 10.5 (L) 11.6 - 15.9 g/dL   HCT 33.2 (L) 34.8 - 46.6 %   MCV 87 81 - 101 fL   MCH 27.6 26.0 - 34.0 pg   MCHC 31.6 (L) 32.0 - 36.0 g/dL   RDW 20.2 (H) 11.1 - 15.7 %   Platelets 289 145 - 400 10e3/uL   NEUT# 3.9 1.5 - 6.5 10e3/uL   LYMPH# 1.6 0.9 - 3.3 10e3/uL   MONO# 0.7 0.1 - 0.9 10e3/uL   Eosinophils Absolute 0.2 0.0 - 0.5 10e3/uL   BASO# 0.0 0.0 - 0.2 10e3/uL   NEUT% 61.8 39.6 - 80.0 %   LYMPH% 25.0 14.0 - 48.0 %   MONO% 10.3 0.0 - 13.0 %   EOS% 2.7 0.0 - 7.0 %   BASO% 0.2 0.0 -  2.0 %  Iron and TIBC     Status: None   Collection Time: 12/23/14 10:10 AM  Result Value Ref Range   Iron 86 41 - 142 ug/dL   TIBC 381 236 - 444 ug/dL   UIBC 296 120 - 384 ug/dL   %SAT 22 21 -  57 %  [facility] - Smear     Status: None   Collection Time: 12/23/14 10:10 AM  Result Value Ref Range   Smear Result Smear Available   Ferritin     Status: None   Collection Time: 12/23/14 10:10 AM  Result Value Ref Range   Ferritin 10 9 - 269 ng/ml  Reticulocyte Count (SLN)     Status: Abnormal (Preliminary result)   Collection Time: 12/23/14 10:10 AM  Result Value Ref Range   Retic Ct Pct 0.6 0.4 - 2.3 %   RBC. 3.81 (L) 3.87 - 5.11 MIL/uL   ABS Retic 22.9 19.0 - 186.0 K/uL      RADIOGRAPHY: No results found.     PATHOLOGY: None  ASSESSMENT/PLAN: Ms. Moor is a very pleasant 49 yo white female with iron deficiency anemia. She is symptomatic with fatigue, dizziness, chewing ice and SOB with exertion.  Her iron saturation today is 22% with a ferritin of 10. Hgb is 10.5 with an MCV of 87.  We will give her a dose of Feraheme today while she is here in the office.  We will plan to see her back in 6 weeks for labs and follow-up.  All questions were answered. She will contact us with any problems, questions or concerns. We can certainly see her much sooner if necessary.  She was discussed with and also seen by Dr. Marin Olp and he is in agreement with the aforementioned.   [facility] M     Addendum:   I saw and examined the patient with Sarah. Her blood smear was very consistent with iron deficiency anemia area and she had some hyperchromic and microcytic red cells.  She is on a proton pump inhibitor so this is probably why she is not able to absorb iron. She still has her monthly cycles so she is also losing iron area did  Her iron studies today show ferritin of 10 with iron saturation of 22%.  I don't see that we have to do a bone marrow biopsy on her. I just do not think  this will help [place] out.  We will go ahead and give her 2 doses of IV iron. We will then plan to get her back in about 6 weeks later. Hopefully, she will be feeling better.  We spent about 35-40 minutes with her. We reviewed her lab work. We answered her questions.  Laurey Arrow

## 2014-12-23 NOTE — Progress Notes (Signed)
1235 Complain of "itchiness, roof of mouth feels funny", Iron infusion was 95% complete, Dr. Marin Olp at bedside, orders for Solumedrol. Also states is having a panic attack, orders received for Ativan. 12:54 PM States feeling much better. 2:00 PM No complaints, VSS.Lisa Perkins

## 2014-12-23 NOTE — Patient Instructions (Signed)

## 2014-12-27 LAB — RETICULOCYTES (CHCC)
ABS Retic: 22.9 10*3/uL (ref 19.0–186.0)
RBC.: 3.81 MIL/uL — ABNORMAL LOW (ref 3.87–5.11)
Retic Ct Pct: 0.6 % (ref 0.4–2.3)

## 2014-12-27 LAB — HEMOGLOBINOPATHY EVALUATION
Hemoglobin Other: 0 %
Hgb A2 Quant: 2.5 % (ref 2.2–3.2)
Hgb A: 97.5 % (ref 96.8–97.8)
Hgb F Quant: 0 % (ref 0.0–2.0)
Hgb S Quant: 0 %

## 2014-12-27 LAB — ERYTHROPOIETIN: Erythropoietin: 14.6 m[IU]/mL (ref 2.6–18.5)

## 2014-12-29 LAB — HM COLONOSCOPY: HM Colonoscopy: NORMAL

## 2015-01-11 ENCOUNTER — Encounter: Payer: Self-pay | Admitting: Family Medicine

## 2015-02-07 ENCOUNTER — Other Ambulatory Visit (HOSPITAL_BASED_OUTPATIENT_CLINIC_OR_DEPARTMENT_OTHER): Payer: 59

## 2015-02-07 ENCOUNTER — Ambulatory Visit (HOSPITAL_BASED_OUTPATIENT_CLINIC_OR_DEPARTMENT_OTHER): Payer: 59 | Admitting: Family

## 2015-02-07 ENCOUNTER — Encounter: Payer: Self-pay | Admitting: Family

## 2015-02-07 VITALS — BP 128/67 | HR 92 | Temp 97.9°F | Resp 16 | Ht 65.0 in | Wt 142.0 lb

## 2015-02-07 DIAGNOSIS — D509 Iron deficiency anemia, unspecified: Secondary | ICD-10-CM

## 2015-02-07 DIAGNOSIS — D500 Iron deficiency anemia secondary to blood loss (chronic): Secondary | ICD-10-CM

## 2015-02-07 LAB — CBC WITH DIFFERENTIAL (CANCER CENTER ONLY)
BASO#: 0 10*3/uL (ref 0.0–0.2)
BASO%: 0.1 % (ref 0.0–2.0)
EOS%: 1.7 % (ref 0.0–7.0)
Eosinophils Absolute: 0.2 10*3/uL (ref 0.0–0.5)
HCT: 37.1 % (ref 34.8–46.6)
HGB: 12.1 g/dL (ref 11.6–15.9)
LYMPH#: 1.1 10*3/uL (ref 0.9–3.3)
LYMPH%: 11.9 % — ABNORMAL LOW (ref 14.0–48.0)
MCH: 30.9 pg (ref 26.0–34.0)
MCHC: 32.6 g/dL (ref 32.0–36.0)
MCV: 95 fL (ref 81–101)
MONO#: 0.8 10*3/uL (ref 0.1–0.9)
MONO%: 8.7 % (ref 0.0–13.0)
NEUT#: 7 10*3/uL — ABNORMAL HIGH (ref 1.5–6.5)
NEUT%: 77.6 % (ref 39.6–80.0)
Platelets: 243 10*3/uL (ref 145–400)
RBC: 3.91 10*6/uL (ref 3.70–5.32)
RDW: 16.1 % — ABNORMAL HIGH (ref 11.1–15.7)
WBC: 9 10*3/uL (ref 3.9–10.0)

## 2015-02-07 NOTE — Progress Notes (Signed)
Hematology and Oncology Follow Up Visit  Lisa Perkins YX:[id number] [birth year] 49 y.o. 02/07/2015   Principle Diagnosis:  Iron deficiency anemia  Current Therapy:   IV iron as indicated    Interim History:  Lisa Perkins is here today for a follow-up. She is feeling much better since receiving Feraheme in November. She still has some mild SOB and chewing ice occasionally.  She denies fatigue and dizziness. No chills, rash, blurred vision, headaches, chest pain, palpitations, abdominal pain or changes in bowel or bladder habits.  Lisa Perkins cycles are still regular and not heavy.  Lisa Perkins colonoscopy in November was negative. No swelling, tenderness, numbness or tingling in Lisa Perkins extremities.  She is eating healthy and staying well hydrated. Lisa Perkins weight is stable.  She is walking on Lisa Perkins elliptical at home for exercise.   Medications:    Medication List       This list is accurate as of: 02/07/15  3:12 PM.  Always use your most recent med list.               albuterol 108 (90 BASE) MCG/ACT inhaler  Commonly known as:  PROVENTIL HFA;VENTOLIN HFA  Inhale 2 puffs into the lungs every 6 (six) hours as needed for wheezing or shortness of breath.     citalopram 20 MG tablet  Commonly known as:  CELEXA  Take 1.5 tablets by mouth daily.     clonazePAM 0.5 MG tablet  Commonly known as:  KLONOPIN  Take 0.5 mg by mouth at bedtime as needed.     lamoTRIgine 100 MG tablet  Commonly known as:  LAMICTAL  Take 1 tablet by mouth daily.     lansoprazole 30 MG capsule  Commonly known as:  PREVACID        Allergies:  Allergies  Allergen Reactions  . Amoxicillin     REACTION: nausea  gi signs  . Erythromycin     Past Medical History, Surgical history, Social history, and Family History were reviewed and updated.  Review of Systems: All other 10 point review of systems is negative.   Physical Exam:  vitals were not taken for this visit.  Wt Readings from Last 3 Encounters:  12/23/14 144  lb (65.318 kg)  10/22/14 141 lb (63.957 kg)  06/11/14 141 lb 6.4 oz (64.139 kg)    Ocular: Sclerae unicteric, pupils equal, round and reactive to light Ear-nose-throat: Oropharynx clear, dentition fair Lymphatic: No cervical supraclavicular or axillary adenopathy Lungs no rales or rhonchi, good excursion bilaterally Heart regular rate and rhythm, no murmur appreciated Abd soft, nontender, positive bowel sounds MSK no focal spinal tenderness, no joint edema Neuro: non-focal, well-oriented, appropriate affect Breasts: Deferred  Lab Results  Component Value Date   WBC 9.0 02/07/2015   HGB 12.1 02/07/2015   HCT 37.1 02/07/2015   MCV 95 02/07/2015   PLT 243 02/07/2015   Lab Results  Component Value Date   FERRITIN 10 12/23/2014   IRON 86 12/23/2014   TIBC 381 12/23/2014   UIBC 296 12/23/2014   IRONPCTSAT 22 12/23/2014   Lab Results  Component Value Date   RETICCTPCT 0.6 12/23/2014   RBC 3.91 02/07/2015   RETICCTABS 22.9 12/23/2014   No results found for: KPAFRELGTCHN, LAMBDASER, KAPLAMBRATIO No results found for: IGGSERUM, IGA, IGMSERUM No results found for: TOTALPROTELP, ALBUMINELP, A1GS, A2GS, BETS, BETA2SER, GAMS, MSPIKE, SPEI   Chemistry   No results found for: NA, K, CL, CO2, BUN, CREATININE, GLU No results found for: CALCIUM, ALKPHOS, AST, ALT,  BILITOT    Impression and Plan: Lisa Perkins is a very pleasant 49 yo white female with iron deficiency anemia. She received Feraheme in November and has responded nicely. Lisa Perkins symptoms continue to resolve and Lisa Perkins Hgb is now up to 12.5 with an MCV of 95.  We will see what Lisa Perkins iron studies show and bring Lisa Perkins in next week for Feraheme if needed.  We will plan to see Lisa Perkins back in 3 months for labs and follow-up.  She will contact us with any questions or concerns. We can certainly see Lisa Perkins sooner if need be.   Eliezer Bottom, NP 12/19/20163:12 PM

## 2015-02-08 LAB — IRON AND TIBC
%SAT: 15 % — ABNORMAL LOW (ref 21–57)
Iron: 38 ug/dL — ABNORMAL LOW (ref 41–142)
TIBC: 254 ug/dL (ref 236–444)
UIBC: 216 ug/dL (ref 120–384)

## 2015-02-08 LAB — FERRITIN: Ferritin: 48 ng/ml (ref 9–269)

## 2015-02-10 ENCOUNTER — Other Ambulatory Visit: Payer: Self-pay | Admitting: Family

## 2015-02-10 ENCOUNTER — Telehealth: Payer: Self-pay | Admitting: *Deleted

## 2015-02-10 ENCOUNTER — Other Ambulatory Visit: Payer: Self-pay | Admitting: Emergency Medicine

## 2015-02-10 DIAGNOSIS — D509 Iron deficiency anemia, unspecified: Secondary | ICD-10-CM

## 2015-02-10 MED ORDER — METHYLPREDNISOLONE SODIUM SUCC 125 MG IJ SOLR: 80.0000 mg | Freq: Once | INTRAMUSCULAR | Status: DC

## 2015-02-10 MED ORDER — SODIUM CHLORIDE 0.9 % IV SOLN
510.0000 mg | Freq: Once | INTRAVENOUS | Status: DC
  Filled 2015-02-10: qty 17

## 2015-02-10 MED ORDER — FAMOTIDINE IN NACL 20-0.9 MG/50ML-% IV SOLN: 40.0000 mg | Freq: Two times a day (BID) | INTRAVENOUS | Status: DC

## 2015-02-10 NOTE — Telephone Encounter (Addendum)
  Patient aware of results. Apt made.   ----- Message from Eliezer Bottom, NP sent at 02/08/2015  1:29 PM EST ----- Regarding: Iron  Will need 1 dose of Feraheme this week or next week (before new year) please. Iron saturation was 15%. Thank you!  Sarah  ----- Message -----    From: [facility] Interface    Sent: 02/07/2015   3:11 PM      To: Eliezer Bottom, NP

## 2015-02-15 ENCOUNTER — Other Ambulatory Visit: Payer: Self-pay | Admitting: Family

## 2015-02-15 ENCOUNTER — Ambulatory Visit (HOSPITAL_BASED_OUTPATIENT_CLINIC_OR_DEPARTMENT_OTHER): Payer: 59

## 2015-02-15 VITALS — BP 120/57 | HR 73 | Temp 98.1°F | Resp 20

## 2015-02-15 DIAGNOSIS — F419 Anxiety disorder, unspecified: Secondary | ICD-10-CM

## 2015-02-15 DIAGNOSIS — D509 Iron deficiency anemia, unspecified: Secondary | ICD-10-CM

## 2015-02-15 MED ORDER — SODIUM CHLORIDE 0.9 % IV SOLN
510.0000 mg | Freq: Once | INTRAVENOUS | Status: AC
  Administered 2015-02-15: 510 mg via INTRAVENOUS
  Filled 2015-02-15: qty 17

## 2015-02-15 MED ORDER — LORAZEPAM 0.5 MG PO TABS
0.5000 mg | ORAL_TABLET | Freq: Once | ORAL | Status: AC
  Administered 2015-02-15: 0.5 mg via ORAL

## 2015-02-15 MED ORDER — FAMOTIDINE IN NACL 20-0.9 MG/50ML-% IV SOLN
40.0000 mg | Freq: Once | INTRAVENOUS | Status: AC
  Administered 2015-02-15: 40 mg via INTRAVENOUS

## 2015-02-15 MED ORDER — FAMOTIDINE IN NACL 20-0.9 MG/50ML-% IV SOLN
INTRAVENOUS | Status: AC
  Filled 2015-02-15: qty 100

## 2015-02-15 MED ORDER — METHYLPREDNISOLONE SODIUM SUCC 40 MG IJ SOLR
INTRAMUSCULAR | Status: AC
  Filled 2015-02-15: qty 2

## 2015-02-15 MED ORDER — METHYLPREDNISOLONE SODIUM SUCC 125 MG IJ SOLR
80.0000 mg | Freq: Once | INTRAMUSCULAR | Status: AC
  Administered 2015-02-15: 80 mg via INTRAVENOUS

## 2015-02-15 MED ORDER — SODIUM CHLORIDE 0.9 % IV SOLN
INTRAVENOUS | Status: DC
  Administered 2015-02-15: 15:00:00 via INTRAVENOUS

## 2015-02-15 MED ORDER — LORAZEPAM 0.5 MG PO TABS
ORAL_TABLET | ORAL | Status: AC
  Filled 2015-02-15: qty 1

## 2015-02-15 NOTE — Patient Instructions (Signed)

## 2015-02-16 ENCOUNTER — Telehealth: Payer: Self-pay | Admitting: Nurse Practitioner

## 2015-02-16 NOTE — Telephone Encounter (Addendum)
Patient called to cancel lab appointment for 03/02/2015, she says her hematologist is following up with her every few months.

## 2015-03-02 ENCOUNTER — Other Ambulatory Visit: Payer: Self-pay

## 2015-04-11 ENCOUNTER — Other Ambulatory Visit: Payer: Self-pay

## 2015-04-11 DIAGNOSIS — Z1231 Encounter for screening mammogram for malignant neoplasm of breast: Secondary | ICD-10-CM

## 2015-04-26 ENCOUNTER — Ambulatory Visit: Payer: 59

## 2015-05-11 ENCOUNTER — Ambulatory Visit (HOSPITAL_BASED_OUTPATIENT_CLINIC_OR_DEPARTMENT_OTHER): Payer: 59 | Admitting: Family

## 2015-05-11 ENCOUNTER — Other Ambulatory Visit (HOSPITAL_BASED_OUTPATIENT_CLINIC_OR_DEPARTMENT_OTHER): Payer: 59

## 2015-05-11 ENCOUNTER — Encounter: Payer: Self-pay | Admitting: Family

## 2015-05-11 VITALS — BP 111/56 | HR 88 | Temp 97.7°F | Resp 16 | Ht 65.0 in | Wt 141.0 lb

## 2015-05-11 DIAGNOSIS — D509 Iron deficiency anemia, unspecified: Secondary | ICD-10-CM | POA: Diagnosis not present

## 2015-05-11 LAB — IRON AND TIBC
%SAT: 41 % (ref 21–57)
Iron: 97 ug/dL (ref 41–142)
TIBC: 238 ug/dL (ref 236–444)
UIBC: 141 ug/dL (ref 120–384)

## 2015-05-11 LAB — CBC WITH DIFFERENTIAL (CANCER CENTER ONLY)
BASO#: 0 10*3/uL (ref 0.0–0.2)
BASO%: 0.2 % (ref 0.0–2.0)
EOS%: 3.5 % (ref 0.0–7.0)
Eosinophils Absolute: 0.2 10*3/uL (ref 0.0–0.5)
HCT: 37.2 % (ref 34.8–46.6)
HGB: 12.7 g/dL (ref 11.6–15.9)
LYMPH#: 1.3 10*3/uL (ref 0.9–3.3)
LYMPH%: 21.6 % (ref 14.0–48.0)
MCH: 33.8 pg (ref 26.0–34.0)
MCHC: 34.1 g/dL (ref 32.0–36.0)
MCV: 99 fL (ref 81–101)
MONO#: 0.6 10*3/uL (ref 0.1–0.9)
MONO%: 9.7 % (ref 0.0–13.0)
NEUT#: 3.9 10*3/uL (ref 1.5–6.5)
NEUT%: 65 % (ref 39.6–80.0)
Platelets: 279 10*3/uL (ref 145–400)
RBC: 3.76 10*6/uL (ref 3.70–5.32)
RDW: 12 % (ref 11.1–15.7)
WBC: 6 10*3/uL (ref 3.9–10.0)

## 2015-05-11 LAB — FERRITIN: Ferritin: 101 ng/ml (ref 9–269)

## 2015-05-11 NOTE — Progress Notes (Signed)
Hematology and Oncology Follow Up Visit  Lisa Perkins [id number] 07-01-1965 50 y.o. 05/11/2015   Principle Diagnosis:  Iron deficiency anemia  Current Therapy:   IV iron as indicated    Interim History:  Ms. Lisa Perkins is here today for a follow-up. She is doing well and has no complaints at this time. She received Feraheme in December and is concerned that it made her airway tight and caused her to have a flare with her asthma. She was premedicated with that administration of Feraheme and still had this reaction. If she requires an iron infusion again we will look at changing her to Venofer. No fatigue, fever, chills, n/v, cough, rash, dizziness, SOB, chest pain, palpitations, abdominal pain or changes in bowel or bladder habits.   Her cycles are still regular and light.  No swelling, tenderness, numbness or tingling in her extremities. No c/o joint aches or pain.  She has a good appetite but feels that she should be drinking more fluids. Her weight is stable.   Medications:    Medication List       This list is accurate as of: 05/11/15  9:14 AM.  Always use your most recent med list.               albuterol 108 (90 Base) MCG/ACT inhaler  Commonly known as:  PROVENTIL HFA;VENTOLIN HFA  Inhale 2 puffs into the lungs every 6 (six) hours as needed for wheezing or shortness of breath.     citalopram 20 MG tablet  Commonly known as:  CELEXA  Take 1.5 tablets by mouth daily.     clonazePAM 0.5 MG tablet  Commonly known as:  KLONOPIN  Take 0.5 mg by mouth at bedtime as needed.     ibuprofen 200 MG tablet  Commonly known as:  ADVIL,MOTRIN  Take 200 mg by mouth every 6 (six) hours as needed.     lamoTRIgine 100 MG tablet  Commonly known as:  LAMICTAL  Take 1 tablet by mouth daily.     lansoprazole 30 MG capsule  Commonly known as:  PREVACID        Allergies:  Allergies  Allergen Reactions  . Amoxicillin     REACTION: nausea  gi signs  . Erythromycin     Past  Medical History, Surgical history, Social history, and Family History were reviewed and updated.  Review of Systems: All other 10 point review of systems is negative.   Physical Exam:  height is 5\' 5"  (1.651 m) and weight is 141 lb (63.957 kg). Her oral temperature is 97.7 F (36.5 C). Her blood pressure is 111/56 and her pulse is 88. Her respiration is 16.   Wt Readings from Last 3 Encounters:  05/11/15 141 lb (63.957 kg)  02/07/15 142 lb (64.411 kg)  12/23/14 144 lb (65.318 kg)    Ocular: Sclerae unicteric, pupils equal, round and reactive to light Ear-nose-throat: Oropharynx clear, dentition fair Lymphatic: No cervical supraclavicular or axillary adenopathy Lungs no rales or rhonchi, good excursion bilaterally Heart regular rate and rhythm, no murmur appreciated Abd soft, nontender, positive bowel sounds, no liver or spleen tip palpated on exam, no fluid wave  MSK no focal spinal tenderness, no joint edema Neuro: non-focal, well-oriented, appropriate affect Breasts: Deferred  Lab Results  Component Value Date   WBC 6.0 05/11/2015   HGB 12.7 05/11/2015   HCT 37.2 05/11/2015   MCV 99 05/11/2015   PLT 279 05/11/2015   Lab Results  Component Value Date  FERRITIN 48 02/07/2015   IRON 38* 02/07/2015   TIBC 254 02/07/2015   UIBC 216 02/07/2015   IRONPCTSAT 15* 02/07/2015   Lab Results  Component Value Date   RETICCTPCT 0.6 12/23/2014   RBC 3.76 05/11/2015   RETICCTABS 22.9 12/23/2014   No results found for: KPAFRELGTCHN, LAMBDASER, KAPLAMBRATIO No results found for: IGGSERUM, IGA, IGMSERUM No results found for: TOTALPROTELP, ALBUMINELP, A1GS, A2GS, BETS, BETA2SER, GAMS, MSPIKE, SPEI   Chemistry   No results found for: NA, K, CL, CO2, BUN, CREATININE, GLU No results found for: CALCIUM, ALKPHOS, AST, ALT, BILITOT    Impression and Plan: Ms. Lisa Perkins is a very pleasant 50 yo white female with iron deficiency anemia. She is doing well and is asymptomatic at this time.  Her  CBC today looks good. We will see what her iron studies show and bring her in later this week for Venofer if needed.  We will plan to see her back in 3 months for labs and follow-up.  She will contact us with any questions or concerns. We can certainly see her sooner if need be.   Eliezer Bottom, NP 3/22/20179:14 AM

## 2015-05-12 ENCOUNTER — Telehealth: Payer: Self-pay | Admitting: *Deleted

## 2015-05-12 NOTE — Telephone Encounter (Addendum)
Patient aware of results  ----- Message from Eliezer Bottom, NP sent at 05/11/2015  1:57 PM EDT ----- Regarding: Iron Iron studies look great!!! No iron needed!!!   Sarah  ----- Message -----    From: [facility] Interface    Sent: 05/11/2015   8:40 AM      To: Eliezer Bottom, NP

## 2015-05-27 ENCOUNTER — Ambulatory Visit (INDEPENDENT_AMBULATORY_CARE_PROVIDER_SITE_OTHER): Payer: 59 | Admitting: Internal Medicine

## 2015-05-27 ENCOUNTER — Encounter: Payer: Self-pay | Admitting: Internal Medicine

## 2015-05-27 VITALS — BP 120/70 | HR 76 | Temp 99.1°F | Wt 143.9 lb

## 2015-05-27 DIAGNOSIS — M6248 Contracture of muscle, other site: Secondary | ICD-10-CM | POA: Diagnosis not present

## 2015-05-27 DIAGNOSIS — J309 Allergic rhinitis, unspecified: Secondary | ICD-10-CM

## 2015-05-27 DIAGNOSIS — M62838 Other muscle spasm: Secondary | ICD-10-CM

## 2015-05-27 MED ORDER — DOXYCYCLINE HYCLATE 100 MG PO TABS
100.0000 mg | ORAL_TABLET | Freq: Two times a day (BID) | ORAL | Status: DC
Start: 2015-05-27 — End: 2015-11-01

## 2015-05-27 NOTE — Patient Instructions (Addendum)
Ok to do pt referral about the right neck trapezious pain.   Take the nasal cortisone EVERY DAY in season.this helps with chornici sinusitis.  Try claritin d or allegra D  And if needed can add antibiotic    consdieration of  singulair also if not controlled allergy sx    Stretch and some exercises to help back and  Neck

## 2015-05-27 NOTE — Progress Notes (Signed)
Pre visit review using our clinic review tool, if applicable. No additional management support is needed unless otherwise documented below in the visit note. 

## 2015-05-27 NOTE — Progress Notes (Signed)
Chief Complaint  Patient presents with  . Ear Pain    b/l  . Back Pain    HPI: Lisa Perkins 50 y.o.  Patient Lisa Perkins  comes in today for SDA for  New and recurtent  problem evaluation.   Sinus headaches   This time of year    . Usually assoc with allergy    tookj tylenol sinus. Still ongoiing  No itching sneezing  But head feels tight   Starts with  Headache   Dull on top of head  .    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    For  Back    ROS: See pertinent positives and negatives per HPI. Under rx for anemia  See heme notes  Father alzhiemer and working with him  Past Medical History  Diagnosis Date  . ALLERGIC RHINITIS 11/07/2007  . BENIGN NEOPLASM OF SKIN SITE UNSPECIFIED 07/25/2009  . DEPRESSION 11/07/2007  . GERD 11/07/2007    LPR - laryngopharyngeal reflux  . 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 Asthma as child    Family History  Problem Relation Age of Onset  . Asthma Neg Hx     family hx  . Endometriosis Mother   . Hypertension Mother   . Diabetes Maternal Uncle   . Breast cancer Paternal Aunt   . Diabetes Maternal Grandmother   . Hypertension Maternal Grandmother   . Stroke Maternal Grandmother   . Hypertension Maternal Grandfather   . Stroke Maternal Grandfather   . Stroke Paternal Grandmother   . Stroke Paternal Grandfather   . COPD Father   . Dementia Father     Social History   Social History  . Marital Status: Married    Spouse Name: N/A  . Number of Children: N/A  . Years of Education: N/A   Social History Main Topics  . Smoking status: Never Smoker   . Smokeless tobacco: Never Used  . Alcohol Use: 0.0 oz/week    0 Standard drinks or equivalent per  week     Comment: 1 drink per month  . Drug Use: No  . Sexual Activity: Not Currently   Other Topics Concern  . 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    Non smoker     Outpatient Prescriptions Prior to Visit  Medication Sig Dispense Refill  . albuterol (PROVENTIL HFA;VENTOLIN HFA) 108 (90 BASE) MCG/ACT inhaler Inhale 2 puffs into the lungs every 6 (six) hours as needed for wheezing or shortness of breath. 1 Inhaler 2  . citalopram (CELEXA) 20 MG tablet Take 1.5 tablets by mouth daily.    . clonazePAM (KLONOPIN) 0.5 MG tablet Take 0.5 mg by mouth at bedtime as needed.     Marland Kitchen ibuprofen (ADVIL,MOTRIN) 200 MG tablet Take 200 mg by mouth every 6 (six) hours as needed.    . lamoTRIgine (LAMICTAL) 100 MG tablet Take 1 tablet by mouth daily.    . lansoprazole (PREVACID) 30 MG capsule      No facility-administered medications prior to visit.  EXAM:  BP 120/70 mmHg  Pulse 76  Temp(Src) 99.1 F (37.3 C) (Oral)  Wt 143 lb 14.4 oz (65.273 kg)  Body mass index is 23.95 kg/(m^2).  GENERAL: vitals reviewed and listed above, alert, oriented, appears well hydrated and in no acute distress mildly congested  No cough sneeze face non tender  Nares mild congestion  HEENT: atraumatic, conjunctiva  clear, no obvious abnormalities on inspection of external nose and ears OP : no lesion edema or exudate  NECK: no obvious masses on inspection palpation  LUNGS: clear to auscultation bilaterally, no wheezes, rales or rhonchi, neck no bony midline tenderesns  Tight right  trapCV: HRRR, no clubbing cyanosis or  peripheral edema nl cap refill  MS: moves all extremities without noticeable focal  abnormality PSYCH: pleasant and cooperative, no obvious depression or anxiety  ASSESSMENT AND PLAN:  Discussed the following assessment and plan:  Allergic sinusitis  Muscle spasms of neck trapezious - Plan: Ambulatory referral to Physical Therapy   Expectant  management. Try allergy meds first get back on daily nasal steroid add antibiotic if needed  Travels in [place] and may not be able to get back if not improved and antibiotic indicated  Refer back to pt seems to help  Try adding reg exercise  Yoga stretch etc  Pt aware just hasnt been able to do recently  -Patient advised to return or notify health care team  if symptoms worsen ,persist or new concerns arise.  Patient Instructions  Ok to do pt referral about the right neck trapezious pain.   Take the nasal cortisone EVERY DAY in season.this helps with chornici sinusitis.  Try claritin d or allegra D  And if needed can add antibiotic    consdieration of  singulair also if not controlled allergy sx    Stretch and some exercises to help back and  Neck        Jerimiah Wolman K. Ellaree Gear M.D.

## 2015-06-06 ENCOUNTER — Telehealth: Payer: Self-pay | Admitting: Internal Medicine

## 2015-06-06 ENCOUNTER — Ambulatory Visit
Admission: RE | Admit: 2015-06-06 | Discharge: 2015-06-06 | Disposition: A | Discharge status: Home / Self Care | Payer: 59 | Source: Ambulatory Visit

## 2015-06-06 DIAGNOSIS — Z1231 Encounter for screening mammogram for malignant neoplasm of breast: Secondary | ICD-10-CM

## 2015-06-06 NOTE — Telephone Encounter (Signed)
See [facility] note. 

## 2015-06-06 NOTE — Telephone Encounter (Signed)
Ok to refill  patanol ase  x 4

## 2015-06-06 NOTE — Telephone Encounter (Signed)
Patient Name: Lisa Perkins  DOB: 1965/04/02    Initial Comment caller states she has itching eye d/t allergies   Nurse Assessment  Nurse: Leilani Merl, RN, Heather Date/Time (Eastern Time): 06/06/2015 1:58:27 PM  Confirm and document reason for call. If symptomatic, describe symptoms. You must click the next button to save text entered. ---Caller states that she is having eye allergies, she is having itching  Has the patient traveled out of the country within the last 30 days? ---Not Applicable  Does the patient have any new or worsening symptoms? ---Yes  Will a triage be completed? ---Yes  Related visit to physician within the last 2 weeks? ---Yes  Does the PT have any chronic conditions? (i.e. diabetes, asthma, etc.) ---Yes  List chronic conditions. ---See MR  Is the patient pregnant or possibly pregnant? (Ask all females between the ages of 7-55) ---No  Is this a behavioral health or substance abuse call? ---No     Guidelines    Guideline Title Affirmed Question Affirmed Notes  Eye - Allergy Eye allergy is a chronic symptom (recurrent or ongoing AND present > 4 weeks)    Final Disposition User   See PCP within 2 Weeks Standifer, RN, Conservator, museum/gallery states that she was just seen last week for allergies and her patanol prescription has expired for her eyes, she would like the patanol called in to the [facility] on [address]. Please call her when the medication has been called in.   Disagree/Comply: Comply

## 2015-06-06 NOTE — Telephone Encounter (Signed)
Caller states that she was just seen last week for allergies and her patanol prescription has expired for her eyes, she would like the patanol called in to the [facility] on [address]. Please call her when the medication has been called in.

## 2015-06-06 NOTE — Telephone Encounter (Signed)
Patient Name: Lisa Perkins  DOB: [birth year]    Initial Comment caller states she has itchy eyes d/t allergies   Nurse Assessment      Guidelines    Guideline Title Affirmed Question Affirmed Notes       Final Disposition User   FINAL ATTEMPT MADE - no message left Standifer, RN, [employer]

## 2015-06-07 NOTE — Telephone Encounter (Signed)
Left a message for the pt to call back.  Not showing a [facility] on [address].  Need more information.

## 2015-06-08 MED ORDER — OLOPATADINE HCL 0.1 % OP SOLN: 1.0000 [drp] | Freq: Two times a day (BID) | OPHTHALMIC | Status: AC

## 2015-06-08 NOTE — Telephone Encounter (Signed)
Sent to the pharmacy

## 2015-06-09 ENCOUNTER — Ambulatory Visit: Payer: 59 | Attending: Internal Medicine | Admitting: Physical Therapy

## 2015-06-09 DIAGNOSIS — M542 Cervicalgia: Secondary | ICD-10-CM | POA: Insufficient documentation

## 2015-06-09 DIAGNOSIS — R252 Cramp and spasm: Secondary | ICD-10-CM | POA: Insufficient documentation

## 2015-06-09 DIAGNOSIS — M6281 Muscle weakness (generalized): Secondary | ICD-10-CM | POA: Insufficient documentation

## 2015-06-09 NOTE — Therapy (Signed)
[facility] [address] [place], [place], [address] Phone: [phone]   Fax:  [phone]  Physical Therapy Evaluation  Patient Details  Name: Lisa Perkins MRN: [id number] Date of Birth: [birth year] Referring Provider: Shanon Ace  Encounter Date: 06/09/2015      PT End of Session - 06/09/15 1653    Visit Number 1   Date for PT Re-Evaluation 08/04/15   Authorization Type [employer]   PT Start Time [id number]   PT Stop Time 1655   PT Time Calculation (min) 40 min   Activity Tolerance Patient tolerated treatment well      Past Medical History  Diagnosis Date  . ALLERGIC RHINITIS 11/07/2007  . BENIGN NEOPLASM OF SKIN SITE UNSPECIFIED 07/25/2009  . DEPRESSION 11/07/2007  . GERD 11/07/2007    LPR - laryngopharyngeal reflux  . 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 Asthma as child    Past Surgical History  Procedure Laterality Date  . Wisdom tooth extraction    . Tonsillectomy and adenoidectomy      There were no vitals filed for this visit.       Subjective Assessment - 06/09/15 1618    Subjective Patient has been previously seen for neck pain.  Chronic issue exacerbated for no apparent reason last 2-3 months   Pertinent History chronic low back pain L5-S1    How long can you sit comfortably? unlimited   How long can you walk comfortably? lower back limits   Diagnostic tests nothing recent   Patient Stated Goals [facility] speech therapist write notes without upper back pain   Currently in Pain? Yes   Pain Score 1    Pain Location Neck  upper trap   Pain Orientation Right;Left   Pain Type Chronic pain   Pain Onset 1 to 4 weeks ago   Pain Frequency Intermittent   Aggravating Factors  typing on computer; driving, walking the dog and he pulls;  washing hair   Pain Relieving Factors rest, keeping arm done; heat,             [facility] PT Assessment - 06/09/15 0001    Assessment   Medical Diagnosis muscle spasms of neck   Referring Provider Mariann Laster Panosh   Onset Date/Surgical Date --  2-3 months   Hand Dominance Right   Next MD Visit as needed   Prior Therapy 1 year ago at [facility]   Precautions   Precautions None   Restrictions   Weight Bearing Restrictions No   Balance Screen   Has the patient fallen in the past 6 months No   Has the patient had a decrease in activity level because of a fear of falling?  No   Is the patient reluctant to leave their home because of a fear of falling?  No   Home Environment   Living Environment Private residence   Type of Home House   Prior Function   Level of Independence Independent   Vocation Full time employment   Leisure play with dog   Observation/Other Assessments   Focus on Therapeutic Outcomes (FOTO)  48% limitation   Posture/Postural Control   Posture/Postural Control Postural limitations   Postural Limitations Forward head   ROM / Strength   AROM / PROM / Strength AROM;Strength   AROM   Overall AROM Comments UE AROM WFLS   AROM Assessment Site Cervical   Cervical Flexion 60   Cervical Extension 55   Cervical - Right Side Bend 25  Cervical - Left Side Bend 22   Cervical - Right Rotation 50   Cervical - Left Rotation 40   Strength   Overall Strength Comments middle and lower traps 4-/5   Strength Assessment Site Cervical   Cervical Flexion 4-/5   Cervical Extension 4-/5   Flexibility   Soft Tissue Assessment /Muscle Length yes  upper traps, levators B   Palpation   Spinal mobility hypomobility PA and lateral C3-C7   Palpation comment multiple tender points upper traps, levator scap, rhomboids   Special Tests    Special Tests Cervical   Cervical Tests Dictraction;other   Distraction Test   Findngs Positive   other    Findings Negative   Side Right   Comment upper limb tension test                           PT Education - 06/09/15 1705    Education provided Yes   Education Details postural correction in sitting and  standing against wall; cervical retractions in sitting   Person(s) Educated Patient   Methods Explanation;Demonstration   Comprehension Verbalized understanding;Returned demonstration          PT Short Term Goals - 06/09/15 1854    PT SHORT TERM GOAL #1   Title The patient will demonstrate postural awareness when using the computer  07/07/15   Time 4   Period Weeks   Status New   PT SHORT TERM GOAL #2   Title The patient will have improved cervical sidebending to 30 degrees and rotation to left to 50 degrees needed for driving   Time 4   Period Weeks   Status New   PT SHORT TERM GOAL #3   Title The patient will report a 25% improvement in pain with using the computer at work   Time 4   Period Weeks   Status New           PT Long Term Goals - 06/09/15 1903    PT LONG TERM GOAL #1   Title The patient will be independent in safe self progression of HEP for further gains in ROM and strength   08/04/15   Time 8   Period Weeks   Status New   PT LONG TERM GOAL #2   Title The patient will report a 50% improvement in computer work   Time 8   Period Weeks   Status New   PT LONG TERM GOAL #3   Title The patient will have deep cervical muscle to 4/5 needed for maintaining better posture while working   Time 8   Period Weeks   Status New   PT LONG TERM GOAL #4   Title The patient will have improved periscapular strength to 4/5 needed for walking her dog/managing him when he pulls   Time 8   Period Weeks   Status New   PT LONG TERM GOAL #5   Title FOTO functional outcome score improved from 48% to 38% indicating decreased pain and improved function   Time 8   Period Weeks   Status New               Plan - 06/09/15 1844    Clinical Impression Statement The patient is a low complexity evaluation.  She has a chronic history of neck pain which has been excerbated over the last 2-3 months for no apparent reason.  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  She had previous PT which helped but she admits that she has not continued with her home ex program.  Forward head posture.  Decreased cervical AROM in all planes but especially bilateral sidebending.  General cervical joint hypomobility as well as thoracic mobility.  Tender points in bilateral upper traps, levators and rhomboids.  Decreased deep cervical muscle strength and middle and lower traps 4-/5.  Decreased pain with cervical distraction.  No radicular symptoms.  She would benefit from PT to address these deficits.     Rehab Potential Good   PT Frequency 2x / week   PT Duration 8 weeks   PT Treatment/Interventions ADLs/Self Care Home Management;Cryotherapy;Electrical Stimulation;Moist Heat;Therapeutic exercise;Ultrasound;Traction;Patient/family education;Manual techniques;Taping;Dry needling   PT Next Visit Plan Dry needling upper traps, levators, rhomboids and suboccipitals;  postural strengthening;  thoracic extension;  e-stim/heat (patient reports excellent relief from this in the past)      Patient will benefit from skilled therapeutic intervention in order to improve the following deficits and impairments:  Decreased range of motion, Decreased strength, Hypomobility, Increased muscle spasms, Impaired flexibility, Postural dysfunction, Pain  Visit Diagnosis: Cervicalgia - Plan: PT plan of care cert/re-cert  Muscle weakness (generalized) - Plan: PT plan of care cert/re-cert  Cramp and spasm - Plan: PT plan of care cert/re-cert     Problem List Patient Active Problem List   Diagnosis Date Noted  . Iron deficiency anemia 05/11/2015  . Allergic sinusitis 06/05/2013  . Muscle spasms of neck trapezious 04/22/2012  . GERD (gastroesophageal reflux disease) 04/22/2012  . Drug interaction 04/22/2012  . BENIGN NEOPLASM OF SKIN SITE UNSPECIFIED  07/25/2009  . DEPRESSION 11/07/2007  . ALLERGIC RHINITIS 11/07/2007  . 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 GERD 11/07/2007     Ruben Im, PT 06/09/2015 7:15 PM Phone: [phone] Fax: [phone]   Alvera Singh 06/09/2015, 7:15 PM  [facility] [address] [place], [place], [address] Phone: [phone]   Fax:  [phone]  Name: Lisa Perkins MRN: [id number] Date of Birth: December 26, 1965

## 2015-06-13 ENCOUNTER — Encounter: Payer: Self-pay | Admitting: Physical Therapy

## 2015-06-13 ENCOUNTER — Ambulatory Visit: Payer: 59 | Admitting: Physical Therapy

## 2015-06-13 DIAGNOSIS — M542 Cervicalgia: Secondary | ICD-10-CM

## 2015-06-13 DIAGNOSIS — M6281 Muscle weakness (generalized): Secondary | ICD-10-CM

## 2015-06-13 DIAGNOSIS — R252 Cramp and spasm: Secondary | ICD-10-CM

## 2015-06-13 NOTE — Therapy (Signed)
[facility] [address] [place], [place], [address] Phone: [phone]   Fax:  [phone]  Physical Therapy Treatment  Patient Details  Name: Lisa Perkins MRN: [id number] Date of Birth: 22-Apr-1965 Referring Provider: Shanon Ace  Encounter Date: 06/13/2015      PT End of Session - 06/13/15 0815    Visit Number 2   Date for PT Re-Evaluation 08/04/15   Authorization Type UHC   PT Start Time 0801   PT Stop Time 0855   PT Time Calculation (min) 54 min   Activity Tolerance Patient tolerated treatment well   Behavior During Therapy [facility] for tasks assessed/performed      Past Medical History  Diagnosis Date  . ALLERGIC RHINITIS 11/07/2007  . BENIGN NEOPLASM OF SKIN SITE UNSPECIFIED 07/25/2009  . DEPRESSION 11/07/2007  . GERD 11/07/2007    LPR - laryngopharyngeal reflux  . 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 Asthma as child    Past Surgical History  Procedure Laterality Date  . Wisdom tooth extraction    . Tonsillectomy and adenoidectomy      There were no vitals filed for this visit.      Subjective Assessment - 06/13/15 0807    Subjective Pt with neck pain and complains of stiffness mainly in right upper trapezius and bil paraspinals rated as 3/10   Pertinent History chronic low back pain L5-S1    How long can you sit comfortably? unlimited   How long can you walk comfortably? lower back limits   Diagnostic tests nothing recent   Patient Stated Goals [facility] speech therapist write notes without upper back pain   Currently in Pain? Yes   Pain Score 3    Pain Location Neck   Pain Orientation Left;Right   Pain Descriptors / Indicators Tightness   Pain Type Chronic pain   Pain Onset More than a month ago   Pain Frequency Intermittent   Aggravating Factors  typing on computer, driving, wlaking the dog and he pulls, washjing hair.    Pain Relieving Factors rest, keeping arm down, heat   Multiple Pain Sites No                          [facility] Adult PT Treatment/Exercise - 06/13/15 0001    Posture/Postural Control   Posture/Postural Control Postural limitations   Postural Limitations Forward head   Exercises   Exercises Neck   Neck Exercises: Machines for Strengthening   UBE (Upper Arm Bike) 89min  (3/3)  may use green physioball next visit   Neck Exercises: Seated   Neck Retraction 10 reps   Neck Exercises: Supine   Other Supine Exercise Melt method flex/ext & rotation each side x 10 with slow movement  need demo and vc's for technique   Other Supine Exercise deep flexor strength x 10 with 3 sec hold   Neck Exercises: Prone   Other Prone Exercise head over mat table neck extension x 10 with 3 sec hold   Manual Therapy   Manual Therapy Soft tissue mobilization;Joint mobilization  supine/Rt sidelying: bil cervical paraspinals, Rt UT with e    Joint Mobilization Rt scapular mob in left sidelying with elongation of UT & release of levator                  PT Short Term Goals - 06/13/15 0844    PT SHORT TERM GOAL #1   Title The patient will  demonstrate postural awareness when using the computer  07/07/15   Time 4   Period Weeks   Status On-going   PT SHORT TERM GOAL #2   Title The patient will have improved cervical sidebending to 30 degrees and rotation to left to 50 degrees needed for driving   Time 4   Period Weeks   Status On-going   PT SHORT TERM GOAL #3   Title The patient will report a 25% improvement in pain with using the computer at work   Time 4   Period Weeks   Status On-going           PT Long Term Goals - 06/09/15 1903    PT LONG TERM GOAL #1   Title The patient will be independent in safe self progression of HEP for further gains in ROM and strength   08/04/15   Time 8   Period Weeks   Status New   PT LONG TERM GOAL #2   Title The patient will report a 50% improvement in computer work   Time 8   Period Weeks   Status New   PT LONG TERM  GOAL #3   Title The patient will have deep cervical muscle to 4/5 needed for maintaining better posture while working   Time 8   Period Weeks   Status New   PT LONG TERM GOAL #4   Title The patient will have improved periscapular strength to 4/5 needed for walking her dog/managing him when he pulls   Time 8   Period Weeks   Status New   PT LONG TERM GOAL #5   Title FOTO functional outcome score improved from 48% to 38% indicating decreased pain and improved function   Time 8   Period Weeks   Status New               Plan - 06/13/15 [id number]    Clinical Impression Statement Pt with elevated right shoulder. Focused on releasing tenderpoints in Rt UT and strengthening deep cervical flexor and extensor muscles. Pt will continue to benefit from skilled PT for strengthening, stretching and stabilization exercises.    Rehab Potential Good   PT Frequency 2x / week   PT Duration 8 weeks   PT Treatment/Interventions ADLs/Self Care Home Management;Cryotherapy;Electrical Stimulation;Moist Heat;Therapeutic exercise;Ultrasound;Traction;Patient/family education;Manual techniques;Taping;Dry needling   PT Next Visit Plan Dry needling upper traps, levators, rhomboids and suboccipitals;  postural strengthening;  thoracic extension;  e-stim/heat (patient reports excellent relief from this in the past)   Consulted and Agree with Plan of Care Patient      Patient will benefit from skilled therapeutic intervention in order to improve the following deficits and impairments:  Decreased range of motion, Decreased strength, Hypomobility, Increased muscle spasms, Impaired flexibility, Postural dysfunction, Pain  Visit Diagnosis: Cervicalgia  Muscle weakness (generalized)  Cramp and spasm     Problem List Patient Active Problem List   Diagnosis Date Noted  . Iron deficiency anemia 05/11/2015  . Allergic sinusitis 06/05/2013  . Muscle spasms of neck trapezious 04/22/2012  . GERD (gastroesophageal  reflux disease) 04/22/2012  . Drug interaction 04/22/2012  . BENIGN NEOPLASM OF SKIN SITE UNSPECIFIED 07/25/2009  . DEPRESSION 11/07/2007  . ALLERGIC RHINITIS 11/07/2007  . 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 GERD 11/07/2007    NAUMANN-HOUEGNIFIO,Lisa Perkins PTA 06/13/2015, 8:56 AM  [facility] [address], [place], [place], [address] Phone: [phone]   Fax:  [phone]  Name: Lisa Perkins  MRN: [id number] Date of Birth: Feb 07, 1966

## 2015-06-15 ENCOUNTER — Ambulatory Visit: Payer: 59

## 2015-06-15 DIAGNOSIS — M542 Cervicalgia: Secondary | ICD-10-CM | POA: Diagnosis not present

## 2015-06-15 DIAGNOSIS — M6281 Muscle weakness (generalized): Secondary | ICD-10-CM

## 2015-06-15 DIAGNOSIS — R252 Cramp and spasm: Secondary | ICD-10-CM

## 2015-06-15 NOTE — Therapy (Signed)
[facility] [address] [place], [place], [address] Phone: [phone]   Fax:  [phone]  Physical Therapy Treatment  Patient Details  Name: Lisa Perkins MRN: [id number] Date of Birth: Jun 24, 1965 Referring Provider: Shanon Ace  Encounter Date: 06/15/2015      PT End of Session - 06/15/15 1308    Visit Number 3   Date for PT Re-Evaluation 08/04/15   PT Start Time 1232  session ended early due to nausea/dizzy   PT Stop Time 1314   PT Time Calculation (min) 42 min   Activity Tolerance Patient tolerated treatment well   Behavior During Therapy [facility] for tasks assessed/performed      Past Medical History  Diagnosis Date  . ALLERGIC RHINITIS 11/07/2007  . BENIGN NEOPLASM OF SKIN SITE UNSPECIFIED 07/25/2009  . DEPRESSION 11/07/2007  . GERD 11/07/2007    LPR - laryngopharyngeal reflux  . 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 Asthma as child    Past Surgical History  Procedure Laterality Date  . Wisdom tooth extraction    . Tonsillectomy and adenoidectomy      There were no vitals filed for this visit.      Subjective Assessment - 06/15/15 1232    Subjective Pt has been working on posture   Pertinent History chronic low back pain L5-S1    Currently in Pain? Yes   Pain Score 3    Pain Location Neck   Pain Orientation Left;Right   Pain Descriptors / Indicators Tightness   Pain Onset More than a month ago   Pain Frequency Intermittent   Aggravating Factors  typing on computer, driving walking the dog, washing hair   Pain Relieving Factors rest, keeping arm down                         [facility] Adult PT Treatment/Exercise - 06/15/15 0001    Modalities   Modalities Moist Heat   Moist Heat Therapy   Number Minutes Moist Heat 10 Minutes   Moist Heat Location Cervical   Manual Therapy   Manual Therapy Soft tissue mobilization;Joint mobilization   Manual therapy comments soft tissue elongation and  trigger point release to bil UT, suboccpitals and cervical paraspinals          Trigger Point Dry Needling - 06/15/15 1234    Consent Given? Yes   Education Handout Provided Yes   Muscles Treated Upper Body Upper trapezius   Upper Trapezius Response Twitch reponse elicited;Palpable increased muscle length   Oblique Capitus Response --   SubOccipitals Response --   Rhomboids Response --              PT Education - 06/15/15 1238    Education provided Yes   Education Details DN info   Person(s) Educated Patient   Methods Explanation;Demonstration;Handout   Comprehension Verbalized understanding;Returned demonstration          PT Short Term Goals - 06/15/15 1232    PT SHORT TERM GOAL #1   Title The patient will demonstrate postural awareness when using the computer  07/07/15   Status Achieved   PT SHORT TERM GOAL #3   Title The patient will report a 25% improvement in pain with using the computer at work   Time 4   Period Weeks   Status On-going           PT Long Term Goals - 06/09/15 1903    PT LONG TERM  GOAL #1   Title The patient will be independent in safe self progression of HEP for further gains in ROM and strength   08/04/15   Time 8   Period Weeks   Status New   PT LONG TERM GOAL #2   Title The patient will report a 50% improvement in computer work   Time 8   Period Weeks   Status New   PT LONG TERM GOAL #3   Title The patient will have deep cervical muscle to 4/5 needed for maintaining better posture while working   Time 8   Period Weeks   Status New   PT LONG TERM GOAL #4   Title The patient will have improved periscapular strength to 4/5 needed for walking her dog/managing him when he pulls   Time 8   Period Weeks   Status New   PT LONG TERM GOAL #5   Title FOTO functional outcome score improved from 48% to 38% indicating decreased pain and improved function   Time 8   Period Weeks   Status New               Plan - 06/15/15  1252    Clinical Impression Statement Pt with nausea and dizziness with dry needling today so session ended early.  Pt with active trigger points in UT.  Pt is making postural corrections at home and work.  Pt will benefit from manual therapy for thoracic/cervical mobs, soft tissue elongation, postural strengt hand cervical AROM.     Rehab Potential Good   PT Frequency 2x / week   PT Duration 8 weeks   PT Treatment/Interventions ADLs/Self Care Home Management;Cryotherapy;Electrical Stimulation;Moist Heat;Therapeutic exercise;Ultrasound;Traction;Patient/family education;Manual techniques;Taping;Dry needling   PT Next Visit Plan   postural strengthening;  thoracic extension;  e-stim/heat (patient reports excellent relief from this in the past), cervical flexibility   Consulted and Agree with Plan of Care Patient      Patient will benefit from skilled therapeutic intervention in order to improve the following deficits and impairments:  Decreased range of motion, Decreased strength, Hypomobility, Increased muscle spasms, Impaired flexibility, Postural dysfunction, Pain  Visit Diagnosis: Cervicalgia  Muscle weakness (generalized)  Cramp and spasm     Problem List Patient Active Problem List   Diagnosis Date Noted  . Iron deficiency anemia 05/11/2015  . Allergic sinusitis 06/05/2013  . Muscle spasms of neck trapezious 04/22/2012  . GERD (gastroesophageal reflux disease) 04/22/2012  . Drug interaction 04/22/2012  . BENIGN NEOPLASM OF SKIN SITE UNSPECIFIED 07/25/2009  . DEPRESSION 11/07/2007  . ALLERGIC RHINITIS 11/07/2007  . 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 GERD 11/07/2007    Sigurd Sos, PT 06/15/2015 1:11 PM  [facility] [address] [place], [place], [address] Phone: [phone]   Fax:  [phone]  Name: HAGER STAYNER MRN: [id number] Date of Birth: 1965/06/22

## 2015-06-15 NOTE — Patient Instructions (Signed)

## 2015-06-22 ENCOUNTER — Encounter: Payer: Self-pay | Admitting: Physical Therapy

## 2015-06-22 ENCOUNTER — Ambulatory Visit: Payer: 59 | Admitting: Family

## 2015-06-22 ENCOUNTER — Ambulatory Visit: Payer: 59 | Attending: Internal Medicine | Admitting: Physical Therapy

## 2015-06-22 ENCOUNTER — Other Ambulatory Visit: Payer: 59

## 2015-06-22 DIAGNOSIS — R252 Cramp and spasm: Secondary | ICD-10-CM | POA: Insufficient documentation

## 2015-06-22 DIAGNOSIS — M6281 Muscle weakness (generalized): Secondary | ICD-10-CM

## 2015-06-22 DIAGNOSIS — M542 Cervicalgia: Secondary | ICD-10-CM | POA: Insufficient documentation

## 2015-06-22 NOTE — Therapy (Signed)
[facility] [address], [place], [place], [address] Phone: [phone]   Fax:  [phone]  Physical Therapy Treatment  Patient Details  Name: Lisa Perkins MRN: [id number] Date of Birth: 11/24/1965 Referring Provider: Shanon Ace  Encounter Date: 06/22/2015      PT End of Session - 06/22/15 0828    Visit Number 4   Date for PT Re-Evaluation 08/04/15   Authorization Type UHC   PT Start Time 0800   PT Stop Time 0900   PT Time Calculation (min) 60 min   Activity Tolerance Patient tolerated treatment well   Behavior During Therapy [facility] for tasks assessed/performed      Past Medical History  Diagnosis Date  . ALLERGIC RHINITIS 11/07/2007  . BENIGN NEOPLASM OF SKIN SITE UNSPECIFIED 07/25/2009  . DEPRESSION 11/07/2007  . GERD 11/07/2007    LPR - laryngopharyngeal reflux  . 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 Asthma as child    Past Surgical History  Procedure Laterality Date  . Wisdom tooth extraction    . Tonsillectomy and adenoidectomy      There were no vitals filed for this visit.      Subjective Assessment - 06/22/15 0810    Subjective Pt had to drive a lot yesterday and her neck and upper back feels very tight, cramping and stiff.    Pertinent History chronic low back pain L5-S1    How long can you sit comfortably? unlimited   How long can you walk comfortably? lower back limits   Diagnostic tests nothing recent   Patient Stated Goals [facility] speech therapist write notes without upper back pain   Currently in Pain? Yes   Pain Score 4    Pain Location Neck   Pain Orientation Right;Left   Pain Descriptors / Indicators Tightness   Pain Type Chronic pain   Pain Onset More than a month ago   Pain Frequency Intermittent   Aggravating Factors  typing on computer, driving, walking the dog, washing hair   Pain Relieving Factors rest, keeping arm down   Multiple Pain Sites No                          [facility] Adult PT Treatment/Exercise - 06/22/15 0001    Posture/Postural Control   Posture/Postural Control Postural limitations   Postural Limitations Forward head   Neck Exercises: Machines for Strengthening   UBE (Upper Arm Bike) 72min  (3/3)  using green physio ball   Neck Exercises: Seated   Neck Retraction 10 reps  while on FR   Other Seated Exercise thoracic selfmob in sitting    Neck Exercises: Supine   Other Supine Exercise selfmob with towelroll x 3 min   Other Supine Exercise deep flexor strength x 10 with 3 sec hold   Neck Exercises: Prone   Other Prone Exercise head over mat table neck extension x 10 with 3 sec hold   Modalities   Modalities Moist Heat   Moist Heat Therapy   Number Minutes Moist Heat 10 Minutes   Moist Heat Location Cervical   Manual Therapy   Manual Therapy Soft tissue mobilization   Manual therapy comments soft tissue elongation and trigger point release to bil UT, suboccpitals and cervical paraspinals                PT Education - 06/22/15 0823    Education provided Yes   Education Details Cervical extension strengthening in  prone, selfmob in supine with towelroll, selfmob in sitting    Person(s) Educated Patient   Methods Explanation;Demonstration;Handout   Comprehension Verbalized understanding;Returned demonstration          PT Short Term Goals - 06/22/15 0831    PT SHORT TERM GOAL #1   Title The patient will demonstrate postural awareness when using the computer  07/07/15   Time 4   Period Weeks   Status Achieved   PT SHORT TERM GOAL #2   Title The patient will have improved cervical sidebending to 30 degrees and rotation to left to 50 degrees needed for driving   Time 4   Period Weeks   Status On-going   PT SHORT TERM GOAL #3   Title The patient will report a 25% improvement in pain with using the computer at work   Time 4   Period Weeks   Status On-going           PT Long Term Goals - 06/09/15 1903    PT LONG  TERM GOAL #1   Title The patient will be independent in safe self progression of HEP for further gains in ROM and strength   08/04/15   Time 8   Period Weeks   Status New   PT LONG TERM GOAL #2   Title The patient will report a 50% improvement in computer work   Time 8   Period Weeks   Status New   PT LONG TERM GOAL #3   Title The patient will have deep cervical muscle to 4/5 needed for maintaining better posture while working   Time 8   Period Weeks   Status New   PT LONG TERM GOAL #4   Title The patient will have improved periscapular strength to 4/5 needed for walking her dog/managing him when he pulls   Time 8   Period Weeks   Status New   PT LONG TERM GOAL #5   Title FOTO functional outcome score improved from 48% to 38% indicating decreased pain and improved function   Time 8   Period Weeks   Status New               Plan - 06/22/15 [id number]    Clinical Impression Statement Pt tolerated stretching and mobilisation exercises well. Pt with triggerpoints in bil UT Right . left. Pt will continue to benefit from skilled PT for thoracic/cervical mobs, softtisuue work for elongation, postural strength    Rehab Potential Good   PT Frequency 2x / week   PT Duration 8 weeks   PT Treatment/Interventions ADLs/Self Care Home Management;Cryotherapy;Electrical Stimulation;Moist Heat;Therapeutic exercise;Ultrasound;Traction;Patient/family education;Manual techniques;Taping;Dry needling   PT Next Visit Plan   postural strengthening;  thoracic extension;  e-stim/heat (patient reports excellent relief from this in the past), cervical flexibility   Consulted and Agree with Plan of Care Patient      Patient will benefit from skilled therapeutic intervention in order to improve the following deficits and impairments:  Decreased range of motion, Decreased strength, Hypomobility, Increased muscle spasms, Impaired flexibility, Postural dysfunction, Pain  Visit  Diagnosis: Cervicalgia  Muscle weakness (generalized)  Cramp and spasm     Problem List Patient Active Problem List   Diagnosis Date Noted  . Iron deficiency anemia 05/11/2015  . Allergic sinusitis 06/05/2013  . Muscle spasms of neck trapezious 04/22/2012  . GERD (gastroesophageal reflux disease) 04/22/2012  . Drug interaction 04/22/2012  . BENIGN NEOPLASM OF SKIN SITE UNSPECIFIED 07/25/2009  . DEPRESSION 11/07/2007  .  ALLERGIC RHINITIS 11/07/2007  . 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 GERD 11/07/2007    NAUMANN-HOUEGNIFIO,Jamani Bearce PTA 06/22/2015, 9:10 AM  [facility] [address], [place], [place], [address] Phone: [phone]   Fax:  [phone]  Name: LAKETHIA CHILDREY MRN: [id number] Date of Birth: 08/08/1965

## 2015-06-22 NOTE — Patient Instructions (Signed)
     Lie on stomach, head and shoulders over edge, arms at sides. Tuck chin in and slowly raise head and shoulders until they are in line with torso. Repeat _10___ times per set. Do  1- 2  sets per session. Do __2-3__ sessions per day.  [facility] [address] [place], [place] [address] Phone # [phone] Fax [phone]  [email or web address].exer.us/972  Thoracic Self-Mobilization (Sitting)   With small rolled towel at lower ribs level, gently lean back until stretch is felt. Hold  2-3 seconds. Relax. Repeat 10 times per set. Do  1 sets per session. Do 3  sessions per day.  http://orth.exer.us/998   Copyright  VHI. All rights reserved.  Thoracic Self-Mobilization Stretch (Supine)   With small rolled towel at lower ribs level, gently lie back until stretch is felt. Hold  3 min. Relax. Repeat 1 times per set. Do 2 sessions per day.  http://orth.exer.us/994   Copyright  VHI. All rights reserved.

## 2015-06-23 ENCOUNTER — Ambulatory Visit: Payer: 59

## 2015-06-23 DIAGNOSIS — M542 Cervicalgia: Secondary | ICD-10-CM

## 2015-06-23 DIAGNOSIS — M6281 Muscle weakness (generalized): Secondary | ICD-10-CM

## 2015-06-23 DIAGNOSIS — R252 Cramp and spasm: Secondary | ICD-10-CM

## 2015-06-23 NOTE — Therapy (Signed)
[facility] [address] [place], [place], [address] Phone: [phone]   Fax:  [phone]  Physical Therapy Treatment  Patient Details  Name: Lisa Perkins MRN: [id number] Date of Birth: 11-19-1965 Referring Provider: Shanon Ace  Encounter Date: 06/23/2015      PT End of Session - 06/23/15 1606    Visit Number 4   Date for PT Re-Evaluation 08/04/15   PT Start Time [id number]   PT Stop Time 1620   PT Time Calculation (min) 49 min   Activity Tolerance Patient tolerated treatment well   Behavior During Therapy [facility] for tasks assessed/performed      Past Medical History  Diagnosis Date  . ALLERGIC RHINITIS 11/07/2007  . BENIGN NEOPLASM OF SKIN SITE UNSPECIFIED 07/25/2009  . DEPRESSION 11/07/2007  . GERD 11/07/2007    LPR - laryngopharyngeal reflux  . 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 Asthma as child    Past Surgical History  Procedure Laterality Date  . Wisdom tooth extraction    . Tonsillectomy and adenoidectomy      There were no vitals filed for this visit.      Subjective Assessment - 06/23/15 1536    Subjective Pt reports that she had sharp pain in her Rt upper back yesterday.     Patient Stated Goals [facility] speech therapist write notes without upper back pain   Currently in Pain? Yes   Pain Score 3    Pain Location Neck   Pain Orientation Right;Left   Pain Descriptors / Indicators Tightness   Pain Type Chronic pain   Pain Onset More than a month ago   Pain Frequency Intermittent   Aggravating Factors  typing on computer, driving   Pain Relieving Factors rest, stretching                         [facility] Adult PT Treatment/Exercise - 06/23/15 0001    Exercises   Exercises Lumbar   Neck Exercises: Machines for Strengthening   UBE (Upper Arm Bike) 58min  (3/3)  using green physio ball   Neck Exercises: Supine   Upper Extremity D2 Flexion;20 reps;Theraband   Theraband Level (UE D2) Level 1  (Yellow)   Other Supine Exercise supine on foam roll for decompression: x 3 minutes.  Horizontal abduction with yellow band 2x10 bil.     Lumbar Exercises: Quadruped   Madcat/Old Horse 10 reps   Other Quadruped Lumbar Exercises prayer stetch and lateral prayer stretch 3x20 seconds   Modalities   Modalities Moist Heat   Moist Heat Therapy   Number Minutes Moist Heat 10 Minutes   Moist Heat Location Cervical   Manual Therapy   Manual Therapy --                PT Education - 06/22/15 0823    Education provided Yes   Education Details Cervical extension strengthening in prone, selfmob in supine with towelroll, selfmob in sitting    Person(s) Educated Patient   Methods Explanation;Demonstration;Handout   Comprehension Verbalized understanding;Returned demonstration          PT Short Term Goals - 06/22/15 0831    PT SHORT TERM GOAL #1   Title The patient will demonstrate postural awareness when using the computer  07/07/15   Time 4   Period Weeks   Status Achieved   PT SHORT TERM GOAL #2   Title The patient will have improved cervical sidebending to 30 degrees  and rotation to left to 50 degrees needed for driving   Time 4   Period Weeks   Status On-going   PT SHORT TERM GOAL #3   Title The patient will report a 25% improvement in pain with using the computer at work   Time 4   Period Weeks   Status On-going           PT Long Term Goals - 06/09/15 1903    PT LONG TERM GOAL #1   Title The patient will be independent in safe self progression of HEP for further gains in ROM and strength   08/04/15   Time 8   Period Weeks   Status New   PT LONG TERM GOAL #2   Title The patient will report a 50% improvement in computer work   Time 8   Period Weeks   Status New   PT LONG TERM GOAL #3   Title The patient will have deep cervical muscle to 4/5 needed for maintaining better posture while working   Time 8   Period Weeks   Status New   PT LONG TERM GOAL #4   Title  The patient will have improved periscapular strength to 4/5 needed for walking her dog/managing him when he pulls   Time 8   Period Weeks   Status New   PT LONG TERM GOAL #5   Title FOTO functional outcome score improved from 48% to 38% indicating decreased pain and improved function   Time 8   Period Weeks   Status New               Plan - 06/23/15 1540    Clinical Impression Statement Pt tolerated strength advancement well today.  She will perform new HEP on foam roll at home and use foam roll to decompress at home.  Treatment focused on posutral strength, decompression and flexiblity today.  Pt was sore after manual therapy last session so no manual today.  Will resume next session to address muscle trigger points.   Pt will continue to benefit from skilled PT for cervical/thoracic mobiltiy and strength and manual to reduce muscle tension.     Rehab Potential Good   PT Frequency 2x / week   PT Duration 8 weeks   PT Treatment/Interventions ADLs/Self Care Home Management;Cryotherapy;Electrical Stimulation;Moist Heat;Therapeutic exercise;Ultrasound;Traction;Patient/family education;Manual techniques;Taping;Dry needling   PT Next Visit Plan   postural strengthening;  thoracic extension; cervical flexibility. Resume manual   Consulted and Agree with Plan of Care Patient      Patient will benefit from skilled therapeutic intervention in order to improve the following deficits and impairments:  Decreased range of motion, Decreased strength, Hypomobility, Increased muscle spasms, Impaired flexibility, Postural dysfunction, Pain  Visit Diagnosis: Cervicalgia  Muscle weakness (generalized)  Cramp and spasm     Problem List Patient Active Problem List   Diagnosis Date Noted  . Iron deficiency anemia 05/11/2015  . Allergic sinusitis 06/05/2013  . Muscle spasms of neck trapezious 04/22/2012  . GERD (gastroesophageal reflux disease) 04/22/2012  . Drug interaction 04/22/2012  .  BENIGN NEOPLASM OF SKIN SITE UNSPECIFIED 07/25/2009  . DEPRESSION 11/07/2007  . ALLERGIC RHINITIS 11/07/2007  . 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 GERD 11/07/2007     Sigurd Sos, PT 06/23/2015 4:08 PM  [facility] [address] [place], [place], [address] Phone: [phone]   Fax:  [phone]  Name: NALY IRESON MRN: [id number] Date of Birth: 1965/06/01

## 2015-06-23 NOTE — Patient Instructions (Signed)
Do these on foam roll:  PNF Strengthening: Resisted   Standing with resistive band around each hand, bring right arm up and away, thumb back. Repeat _10___ times per set. Do _2___ sets per session. Do _1-2___ sessions per day.      Resisted Horizontal Abduction: Bilateral   Sit or stand, tubing in both hands, arms out in front. Keeping arms straight, pinch shoulder blades together and stretch arms out. Repeat _10___ times per set. Do 2____ sets per session. Do _1-2___ sessions per day.    Scapular Retraction: Elbow Flexion (Standing)   With elbows bent to 90, pinch shoulder blades together and rotate arms out, keeping elbows bent. Repeat _10___ times per set. Do _1___ sets per session. Do many____ sessions per day.  [facility] [address], [place], [place] [address] Phone # [phone] Fax [phone]

## 2015-06-24 ENCOUNTER — Encounter: Payer: 59 | Admitting: Physical Therapy

## 2015-06-28 ENCOUNTER — Ambulatory Visit: Payer: 59

## 2015-06-28 DIAGNOSIS — M542 Cervicalgia: Secondary | ICD-10-CM

## 2015-06-28 DIAGNOSIS — M6281 Muscle weakness (generalized): Secondary | ICD-10-CM

## 2015-06-28 DIAGNOSIS — R252 Cramp and spasm: Secondary | ICD-10-CM

## 2015-06-28 NOTE — Therapy (Signed)
[facility] [address] [place], [place], [address] Phone: [phone]   Fax:  [phone]  Physical Therapy Treatment  Patient Details  Name: Lisa Perkins MRN: [id number] Date of Birth: 02/26/1965 Referring Provider: Shanon Ace  Encounter Date: 06/28/2015      PT End of Session - 06/28/15 0823    Visit Number 5   Date for PT Re-Evaluation 08/04/15   Authorization Type [employer]   PT Start Time 0801   PT Stop Time [id number]   PT Time Calculation (min) 56 min   Activity Tolerance Patient tolerated treatment well      Past Medical History  Diagnosis Date  . ALLERGIC RHINITIS 11/07/2007  . BENIGN NEOPLASM OF SKIN SITE UNSPECIFIED 07/25/2009  . DEPRESSION 11/07/2007  . GERD 11/07/2007    LPR - laryngopharyngeal reflux  . 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 Asthma as child    Past Surgical History  Procedure Laterality Date  . Wisdom tooth extraction    . Tonsillectomy and adenoidectomy      There were no vitals filed for this visit.      Subjective Assessment - 06/28/15 0807    Subjective Not doing exercises.     Currently in Pain? Yes   Pain Score 3    Pain Location Neck   Pain Orientation Right;Left;Mid   Pain Descriptors / Indicators Tightness   Pain Type Chronic pain   Pain Onset More than a month ago   Pain Frequency Intermittent   Aggravating Factors  typing on tablet, walking dog, arms up    Pain Relieving Factors rest, stretching.                           [facility] Adult PT Treatment/Exercise - 06/28/15 0001    Neck Exercises: Machines for Strengthening   UBE (Upper Arm Bike) 15min  (3/3)  using green physio ball   Neck Exercises: Supine   Upper Extremity D2 Flexion;20 reps;Theraband   Theraband Level (UE D2) Level 1 (Yellow)   Other Supine Exercise supine on foam roll for decompression: x 3 minutes.  Horizontal abduction with yellow band 2x10 bil.     Lumbar Exercises: Quadruped    Madcat/Old Horse 10 reps   Other Quadruped Lumbar Exercises prayer stetch and lateral prayer stretch 3x20 seconds   Modalities   Modalities Moist Heat   Moist Heat Therapy   Number Minutes Moist Heat 10 Minutes   Moist Heat Location Cervical   Manual Therapy   Manual Therapy Soft tissue mobilization;Joint mobilization   Manual therapy comments soft tissue elongation and trigger point release to bil UT, suboccpitals and cervical paraspinals   Joint Mobilization PA mobs to cervical and thoracic spine grade 2-3                  PT Short Term Goals - 06/28/15 0812    PT SHORT TERM GOAL #1   Title The patient will demonstrate postural awareness when using the computer  07/07/15   Status Achieved   PT SHORT TERM GOAL #3   Title The patient will report a 25% improvement in pain with using the computer at work   Time 4   Period Weeks   Status On-going           PT Long Term Goals - 06/09/15 1903    PT LONG TERM GOAL #1   Title The patient will be independent in safe self  progression of HEP for further gains in ROM and strength   08/04/15   Time 8   Period Weeks   Status New   PT LONG TERM GOAL #2   Title The patient will report a 50% improvement in computer work   Time 8   Period Weeks   Status New   PT LONG TERM GOAL #3   Title The patient will have deep cervical muscle to 4/5 needed for maintaining better posture while working   Time 8   Period Weeks   Status New   PT LONG TERM GOAL #4   Title The patient will have improved periscapular strength to 4/5 needed for walking her dog/managing him when he pulls   Time 8   Period Weeks   Status New   PT LONG TERM GOAL #5   Title FOTO functional outcome score improved from 48% to 38% indicating decreased pain and improved function   Time 8   Period Weeks   Status New               Plan - 06/28/15 [id number]    Clinical Impression Statement Pt has not been compliant with herw HEP.  Pt with continued neck and  thoracic pain and rates this pain as 3/10.  Pt is more aware of her posture and has been making postural corrections at home and work tasks.  Pt with continued trigger points in neck and thoracic spine.  Pt will continue to benefit from skilled PT for manual, flexiblity and strength.     Rehab Potential Good   PT Frequency 2x / week   PT Duration 8 weeks   PT Treatment/Interventions ADLs/Self Care Home Management;Cryotherapy;Electrical Stimulation;Moist Heat;Therapeutic exercise;Ultrasound;Traction;Patient/family education;Manual techniques;Taping;Dry needling   PT Next Visit Plan   postural strengthening;  thoracic extension; cervical flexibility. Manual for mobs and soft tissue.  Check cervical AROM (rotation and sidebending)   Consulted and Agree with Plan of Care Patient      Patient will benefit from skilled therapeutic intervention in order to improve the following deficits and impairments:  Decreased range of motion, Decreased strength, Hypomobility, Increased muscle spasms, Impaired flexibility, Postural dysfunction, Pain  Visit Diagnosis: Cervicalgia  Muscle weakness (generalized)  Cramp and spasm     Problem List Patient Active Problem List   Diagnosis Date Noted  . Iron deficiency anemia 05/11/2015  . Allergic sinusitis 06/05/2013  . Muscle spasms of neck trapezious 04/22/2012  . GERD (gastroesophageal reflux disease) 04/22/2012  . Drug interaction 04/22/2012  . BENIGN NEOPLASM OF SKIN SITE UNSPECIFIED 07/25/2009  . DEPRESSION 11/07/2007  . ALLERGIC RHINITIS 11/07/2007  . 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 GERD 11/07/2007    Sigurd Sos, PT 06/28/2015 8:50 AM  [facility] [address], [place], [place], [address] Phone: [phone]   Fax:  [phone]  Name: Lisa Perkins MRN: [id number] Date of Birth: 1965-09-27

## 2015-06-30 ENCOUNTER — Ambulatory Visit: Payer: 59 | Admitting: Physical Therapy

## 2015-06-30 DIAGNOSIS — M6281 Muscle weakness (generalized): Secondary | ICD-10-CM

## 2015-06-30 DIAGNOSIS — M542 Cervicalgia: Secondary | ICD-10-CM | POA: Diagnosis not present

## 2015-06-30 DIAGNOSIS — R252 Cramp and spasm: Secondary | ICD-10-CM

## 2015-06-30 NOTE — Therapy (Signed)
[facility] [address], [place], [place], [address] Phone: [phone]   Fax:  [phone]  Physical Therapy Treatment  Patient Details  Name: Lisa Perkins MRN: [id number] Date of Birth: October 02, 1965 Referring Provider: Shanon Ace  Encounter Date: 06/30/2015      PT End of Session - 06/30/15 0904    Visit Number 6   Date for PT Re-Evaluation 08/04/15   Authorization Type UHC   PT Start Time 0805   PT Stop Time 0859   PT Time Calculation (min) 54 min   Activity Tolerance Patient tolerated treatment well      Past Medical History  Diagnosis Date  . ALLERGIC RHINITIS 11/07/2007  . BENIGN NEOPLASM OF SKIN SITE UNSPECIFIED 07/25/2009  . DEPRESSION 11/07/2007  . GERD 11/07/2007    LPR - laryngopharyngeal reflux  . 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 Asthma as child    Past Surgical History  Procedure Laterality Date  . Wisdom tooth extraction    . Tonsillectomy and adenoidectomy      There were no vitals filed for this visit.      Subjective Assessment - 06/30/15 0805    Subjective Feeling OK but tight.  I might slept wrong.  Sleeping with lots of pillows for acid reflux.  Discussed use of wedge or elevated head of bed rather than excessive pillow use.  Interscapular pain.     Currently in Pain? Yes   Pain Score 3    Pain Location Thoracic   Pain Orientation Right;Left            [facility] PT Assessment - 06/30/15 0001    AROM   Cervical Extension 68   Cervical - Right Side Bend 34   Cervical - Left Side Bend 40   Cervical - Right Rotation 45   Cervical - Left Rotation 43                     [facility] Adult PT Treatment/Exercise - 06/30/15 0001    Neck Exercises: Seated   Other Seated Exercise thoracic extension over ball with no neck extension 6x   Neck Exercises: Supine   Other Supine Exercise supine on foam roll for decompression: x 3 minutes.  Horizontal abduction with yellow band 2x10 bil.      Other Supine Exercise on roll overhead, sash. ER yellow band 10x each   Neck Exercises: Prone   Axial Exentsion 10 reps  head lift with UE ext and horizontal abduction   Moist Heat Therapy   Number Minutes Moist Heat 10 Minutes   Moist Heat Location Cervical   Manual Therapy   Manual therapy comments Graston instrument G4 fan and sweep and distraction R/L scapular region ;  upper traps                  PT Short Term Goals - 06/30/15 9038    PT SHORT TERM GOAL #1   Title The patient will demonstrate postural awareness when using the computer  07/07/15   Status Achieved   PT SHORT TERM GOAL #2   Title The patient will have improved cervical sidebending to 30 degrees and rotation to left to 50 degrees needed for driving   Time 4   Period Weeks   Status Partially Met   PT SHORT TERM GOAL #3   Title The patient will report a 25% improvement in pain with using the computer at work   Time 4  Period Weeks   Status On-going           PT Long Term Goals - 06/30/15 0923    PT LONG TERM GOAL #1   Title The patient will be independent in safe self progression of HEP for further gains in ROM and strength   08/04/15   Time 8   Period Weeks   Status On-going   PT LONG TERM GOAL #2   Title The patient will report a 50% improvement in computer work   Time 8   Period Weeks   Status On-going   PT LONG TERM GOAL #3   Title The patient will have deep cervical muscle to 4/5 needed for maintaining better posture while working   Time 8   Period Weeks   Status On-going   PT LONG TERM GOAL #4   Title The patient will have improved periscapular strength to 4/5 needed for walking her dog/managing him when he pulls   Time 8   Period Weeks   Status On-going   PT LONG TERM GOAL #5   Title FOTO functional outcome score improved from 48% to 38% indicating decreased pain and improved function   Time 8   Period Weeks   Status On-going               Plan - 06/30/15 0907     Clinical Impression Statement Primary pain site interscapular region bilaterally.   Good improvement in cervical sidebending and extension.  Decreased thoracic extension and scapular mobility.  Therapist closely monitoring for pain and modifying to limit head position secondary to hx of vertigo.     PT Next Visit Plan   postural strengthening;  thoracic extension; cervical flexibility. Manual for mobs and soft tissue. assess progress with remaining STGs      Patient will benefit from skilled therapeutic intervention in order to improve the following deficits and impairments:     Visit Diagnosis: Cervicalgia  Muscle weakness (generalized)  Cramp and spasm     Problem List Patient Active Problem List   Diagnosis Date Noted  . Iron deficiency anemia 05/11/2015  . Allergic sinusitis 06/05/2013  . Muscle spasms of neck trapezious 04/22/2012  . GERD (gastroesophageal reflux disease) 04/22/2012  . Drug interaction 04/22/2012  . BENIGN NEOPLASM OF SKIN SITE UNSPECIFIED 07/25/2009  . DEPRESSION 11/07/2007  . ALLERGIC RHINITIS 11/07/2007  . 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 GERD 11/07/2007   Ruben Im, PT 06/30/2015 9:33 AM Phone: [phone] Fax: [phone]  Alvera Singh 06/30/2015, 9:32 AM  [facility] [address] [place], [place], [address] Phone: [phone]   Fax:  [phone]  Name: LORRE OPDAHL MRN: [id number] Date of Birth: 26-Nov-1965

## 2015-07-07 ENCOUNTER — Encounter: Payer: Self-pay | Admitting: Physical Therapy

## 2015-07-07 ENCOUNTER — Ambulatory Visit: Payer: 59 | Admitting: Physical Therapy

## 2015-07-07 DIAGNOSIS — M542 Cervicalgia: Secondary | ICD-10-CM | POA: Diagnosis not present

## 2015-07-07 DIAGNOSIS — M6281 Muscle weakness (generalized): Secondary | ICD-10-CM

## 2015-07-07 DIAGNOSIS — R252 Cramp and spasm: Secondary | ICD-10-CM

## 2015-07-07 NOTE — Therapy (Signed)
[facility] [address], [place], [place], [address] Phone: [phone]   Fax:  [phone]  Physical Therapy Treatment  Patient Details  Name: Lisa Perkins MRN: [id number] Date of Birth: 03-17-1965 Referring Provider: Shanon Perkins  Encounter Date: 07/07/2015      PT End of Session - 07/07/15 1552    Visit Number 7   Date for PT Re-Evaluation 08/04/15   Authorization Type UHC   PT Start Time 6387   PT Stop Time 1629   PT Time Calculation (min) 55 min   Activity Tolerance Patient tolerated treatment well   Behavior During Therapy [facility] for tasks assessed/performed      Past Medical History  Diagnosis Date  . ALLERGIC RHINITIS 11/07/2007  . BENIGN NEOPLASM OF SKIN SITE UNSPECIFIED 07/25/2009  . DEPRESSION 11/07/2007  . GERD 11/07/2007    LPR - laryngopharyngeal reflux  . 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 Asthma as child    Past Surgical History  Procedure Laterality Date  . Wisdom tooth extraction    . Tonsillectomy and adenoidectomy      There were no vitals filed for this visit.      Subjective Assessment - 07/07/15 1540    Subjective Continues to feel tight in upper back and neck.    Pertinent History chronic low back pain L5-S1    How long can you sit comfortably? unlimited   How long can you walk comfortably? lower back limits   Diagnostic tests nothing recent   Patient Stated Goals [facility] speech therapist write notes without upper back pain   Currently in Pain? Yes   Pain Score 2    Pain Location Thoracic   Pain Orientation Right   Pain Type Chronic pain   Pain Onset More than a month ago   Pain Frequency Intermittent   Pain Relieving Factors rest, stretching   Multiple Pain Sites No                         [facility] Adult PT Treatment/Exercise - 07/07/15 0001    Posture/Postural Control   Posture/Postural Control Postural limitations   Postural Limitations Forward head   Exercises    Exercises Lumbar   Neck Exercises: Machines for Strengthening   UBE (Upper Arm Bike) 59mn (3/3),sitting on green physioball, with turning head x 5 each min   Neck Exercises: Seated   Other Seated Exercise thoracic extension over ball with no neck extension 6x   Neck Exercises: Supine   Capital Flexion 20 reps  with towelroll on OA   Other Supine Exercise supine on foam roll for decompression: x 3 minutes.  Horizontal abduction with yellow band 2x10 bil.     Other Supine Exercise on roll overhead, sash. ER yellow band 10x each   Lumbar Exercises: Quadruped   Madcat/Old Horse --   Modalities   Modalities Moist Heat   Moist Heat Therapy   Number Minutes Moist Heat 10 Minutes   Moist Heat Location Cervical  and thoracic in supine   Manual Therapy   Manual Therapy Soft tissue mobilization;Joint mobilization   Manual therapy comments elongation and TP release to bil UT and bil cervical paraspinals                  PT Short Term Goals - 07/07/15 1559    PT SHORT TERM GOAL #1   Title The patient will demonstrate postural awareness when using the computer  07/07/15  Time 4   Period Weeks   Status Achieved   PT SHORT TERM GOAL #2   Title The patient will have improved cervical sidebending to 30 degrees and rotation to left to 50 degrees needed for driving   Time 4   Period Weeks   Status Partially Met   PT SHORT TERM GOAL #3   Title The patient will report a 25% improvement in pain with using the computer at work   Time 4   Period Weeks   Status Achieved           PT Long Term Goals - 06/30/15 0923    PT LONG TERM GOAL #1   Title The patient will be independent in safe self progression of HEP for further gains in ROM and strength   08/04/15   Time 8   Period Weeks   Status On-going   PT LONG TERM GOAL #2   Title The patient will report a 50% improvement in computer work   Time 8   Period Weeks   Status On-going   PT LONG TERM GOAL #3   Title The patient  will have deep cervical muscle to 4/5 needed for maintaining better posture while working   Time 8   Period Weeks   Status On-going   PT LONG TERM GOAL #4   Title The patient will have improved periscapular strength to 4/5 needed for walking her dog/managing him when he pulls   Time 8   Period Weeks   Status On-going   PT LONG TERM GOAL #5   Title FOTO functional outcome score improved from 48% to 38% indicating decreased pain and improved function   Time 8   Period Weeks   Status On-going               Plan - 07/07/15 1554    Clinical Impression Statement Continues with headache and thoracic and interscapular region. Decreased thoracic extension and scapular mobility. Pt will continue to benefit from PT to address flexibility and strength thoracic and cervical spine    Rehab Potential Good   PT Frequency 2x / week   PT Duration 8 weeks   PT Treatment/Interventions ADLs/Self Care Home Management;Cryotherapy;Electrical Stimulation;Moist Heat;Therapeutic exercise;Ultrasound;Traction;Patient/family education;Manual techniques;Taping;Dry needling   PT Next Visit Plan   postural strengthening;  thoracic extension; cervical flexibility. Manual for mobs and soft tissue. assess progress with remaining STGs   Consulted and Agree with Plan of Care Patient      Patient will benefit from skilled therapeutic intervention in order to improve the following deficits and impairments:  Decreased range of motion, Decreased strength, Hypomobility, Increased muscle spasms, Impaired flexibility, Postural dysfunction, Pain  Visit Diagnosis: Cervicalgia  Muscle weakness (generalized)  Cramp and spasm     Problem List Patient Active Problem List   Diagnosis Date Noted  . Iron deficiency anemia 05/11/2015  . Allergic sinusitis 06/05/2013  . Muscle spasms of neck trapezious 04/22/2012  . GERD (gastroesophageal reflux disease) 04/22/2012  . Drug interaction 04/22/2012  . BENIGN NEOPLASM OF  SKIN SITE UNSPECIFIED 07/25/2009  . DEPRESSION 11/07/2007  . ALLERGIC RHINITIS 11/07/2007  . 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 GERD 11/07/2007    Lisa Avallone PTA 07/07/2015, 5:31 PM  [facility] [address], [place], [place], [address] Phone: [phone]   Fax:  [phone]  Name: Lisa Perkins MRN: [id number] Date of Birth: [birth year]

## 2015-07-08 ENCOUNTER — Ambulatory Visit: Payer: 59 | Admitting: Physical Therapy

## 2015-07-08 DIAGNOSIS — M6281 Muscle weakness (generalized): Secondary | ICD-10-CM

## 2015-07-08 DIAGNOSIS — M542 Cervicalgia: Secondary | ICD-10-CM

## 2015-07-08 DIAGNOSIS — R252 Cramp and spasm: Secondary | ICD-10-CM

## 2015-07-08 NOTE — Therapy (Signed)
[facility] [address] [place], [place], [address] Phone: [phone]   Fax:  [phone]  Physical Therapy Treatment  Patient Details  Name: Lisa Perkins MRN: [id number] Date of Birth: 04-13-1965 Referring Provider: Shanon Ace  Encounter Date: 07/08/2015      PT End of Session - 07/08/15 0800    Visit Number 8   Date for PT Re-Evaluation 08/04/15   Authorization Type UHC   PT Start Time 0801   PT Stop Time 0849   PT Time Calculation (min) 48 min   Activity Tolerance Patient tolerated treatment well      Past Medical History  Diagnosis Date  . ALLERGIC RHINITIS 11/07/2007  . BENIGN NEOPLASM OF SKIN SITE UNSPECIFIED 07/25/2009  . DEPRESSION 11/07/2007  . GERD 11/07/2007    LPR - laryngopharyngeal reflux  . 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 Asthma as child    Past Surgical History  Procedure Laterality Date  . Wisdom tooth extraction    . Tonsillectomy and adenoidectomy      There were no vitals filed for this visit.      Subjective Assessment - 07/08/15 0801    Subjective 2/10 right upper trap/levator scap, medial border of scapula   Currently in Pain? Yes   Pain Score 2    Pain Orientation Right   Pain Type Chronic pain                         [facility] Adult PT Treatment/Exercise - 07/08/15 0001    Neck Exercises: Machines for Strengthening   UBE (Upper Arm Bike) 77mn (3/3),sitting on green physioball, with turning head x 5 each min   Neck Exercises: Standing   Wall Push Ups 10 reps   Upper Extremity D1 Extension;10 reps;Theraband   Theraband Level (UE D1) Level 2 (Red)   Other Standing Exercises red band pull downs 10x   Other Standing Exercises 25# power tower rows B 10x   Neck Exercises: Prone   Axial Exentsion 10 reps  head lift with UE ext and horizontal abduction   Lumbar Exercises: Quadruped   Single Arm Raise 5 reps   Straight Leg Raise 5 reps   Opposite Arm/Leg Raise  Right arm/Left leg;Left arm/Right leg;5 reps   Moist Heat Therapy   Number Minutes Moist Heat 10 Minutes   Moist Heat Location Cervical  and thoracic in supine   Manual Therapy   Manual therapy comments Graston instrument G4 fan and sweep and distraction R scapular region ;  upper traps                PT Education - 07/08/15 0830    Education provided Yes   Education Details quadruped bird dogs   Person(s) Educated Patient   Methods Explanation;Demonstration;Handout   Comprehension Verbalized understanding;Returned demonstration          PT Short Term Goals - 07/08/15 0800    PT SHORT TERM GOAL #1   Title The patient will demonstrate postural awareness when using the computer  07/07/15   Status Achieved   PT SHORT TERM GOAL #2   Title The patient will have improved cervical sidebending to 30 degrees and rotation to left to 50 degrees needed for driving   Time 4   Period Weeks   Status Partially Met   PT SHORT TERM GOAL #3   Title The patient will report a 25% improvement in pain with using the computer at  work   Status Achieved           PT Long Term Goals - 07/08/15 0800    PT LONG TERM GOAL #1   Title The patient will be independent in safe self progression of HEP for further gains in ROM and strength   08/04/15   Time 8   Period Weeks   Status On-going   PT LONG TERM GOAL #2   Title The patient will report a 50% improvement in computer work   Time 8   Period Weeks   Status On-going   PT LONG TERM GOAL #3   Title The patient will have deep cervical muscle to 4/5 needed for maintaining better posture while working   Time 8   Period Weeks   Status On-going   PT LONG TERM GOAL #4   Title The patient will have improved periscapular strength to 4/5 needed for walking her dog/managing him when he pulls   Time 8   Period Weeks   Status On-going   PT LONG TERM GOAL #5   Title FOTO functional outcome score improved from 48% to 38% indicating decreased pain  and improved function   Time 8   Period Weeks   Status On-going               Plan - 07/08/15 3967    Clinical Impression Statement Verbal and tactile cues for upright posture including aligning ears over shoulders.  Fatigues quickly with scapular strengthening.  Improving soft tissue length upper trap and levators by end of treatment session.  Muscular fatigue bu no pain.     PT Next Visit Plan   postural strengthening;  thoracic extension; cervical flexibility. Manual for mobs and soft tissue      Patient will benefit from skilled therapeutic intervention in order to improve the following deficits and impairments:     Visit Diagnosis: Cervicalgia  Muscle weakness (generalized)  Cramp and spasm     Problem List Patient Active Problem List   Diagnosis Date Noted  . Iron deficiency anemia 05/11/2015  . Allergic sinusitis 06/05/2013  . Muscle spasms of neck trapezious 04/22/2012  . GERD (gastroesophageal reflux disease) 04/22/2012  . Drug interaction 04/22/2012  . BENIGN NEOPLASM OF SKIN SITE UNSPECIFIED 07/25/2009  . DEPRESSION 11/07/2007  . ALLERGIC RHINITIS 11/07/2007  . 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 GERD 11/07/2007   Ruben Im, PT 07/08/2015 9:03 AM Phone: [phone] Fax: [phone]  Lisa Perkins 07/08/2015, 9:03 AM  [facility] [address], [place], [place], [address] Phone: [phone]   Fax:  [phone]  Name: Lisa Perkins MRN: [id number] Date of Birth: 12/05/1965

## 2015-07-08 NOTE — Patient Instructions (Signed)
Isometric Hold (Quadruped)   On hands and knees, slowly inhale, and then exhale. Pull navel toward spine and Hold for _3__ seconds. Continue to breathe in and out during hold. Rest for _5__ seconds. Repeat _5__ times. Do ___ times a day.   Copyright  VHI. All rights reserved.  Bracing With Arm Raise (Quadruped)   On hands and knees find neutral spine. Tighten pelvic floor and abdominals and hold. Alternately lift arm to shoulder level. Repeat _5_ times. Do _1__ times a day.   Copyright  VHI. All rights reserved.  Bracing With Leg Raise (Quadruped)   On hands and knees find neutral spine. Tighten pelvic floor and abdominals and hold. Alternating legs, straighten and lift to hip level. Repeat _5__ times. Do __1_ times a day.   Copyright  VHI. All rights reserved.  Bracing With Arm / Leg Raise (Quadruped)   On hands and knees find neutral spine. Tighten pelvic floor and abdominals and hold. Alternating, lift arm to shoulder level and opposite leg to hip level. Repeat 5___ times. Do _1__ times a day.   Copyright  VHI. All rights reserved.  Ruben Im PT [facility] [address] [place], [place] [address] Phone # [phone] Fax [phone]

## 2015-07-13 ENCOUNTER — Ambulatory Visit: Payer: 59 | Admitting: Physical Therapy

## 2015-07-13 ENCOUNTER — Encounter: Payer: Self-pay | Admitting: Physical Therapy

## 2015-07-13 DIAGNOSIS — M6281 Muscle weakness (generalized): Secondary | ICD-10-CM

## 2015-07-13 DIAGNOSIS — M542 Cervicalgia: Secondary | ICD-10-CM | POA: Diagnosis not present

## 2015-07-13 DIAGNOSIS — R252 Cramp and spasm: Secondary | ICD-10-CM

## 2015-07-13 NOTE — Therapy (Signed)
[facility] [address], [place], [place], [address] Phone: [phone]   Fax:  [phone]  Physical Therapy Treatment  Patient Details  Name: Lisa Perkins MRN: [id number] Date of Birth: Jul 24, 1965 Referring Provider: Shanon Ace  Encounter Date: 07/13/2015      PT End of Session - 07/13/15 0813    Visit Number 9   Date for PT Re-Evaluation 08/04/15   Authorization Type UHC   PT Start Time 0806   PT Stop Time 0902   PT Time Calculation (min) 56 min   Activity Tolerance Patient tolerated treatment well   Behavior During Therapy [facility] for tasks assessed/performed      Past Medical History  Diagnosis Date  . ALLERGIC RHINITIS 11/07/2007  . BENIGN NEOPLASM OF SKIN SITE UNSPECIFIED 07/25/2009  . DEPRESSION 11/07/2007  . GERD 11/07/2007    LPR - laryngopharyngeal reflux  . 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 Asthma as child    Past Surgical History  Procedure Laterality Date  . Wisdom tooth extraction    . Tonsillectomy and adenoidectomy      There were no vitals filed for this visit.      Subjective Assessment - 07/13/15 0810    Subjective Pt reports she hurt her left shoulder with working on [employer]-band's wishes to skip this today. The pain in left shulder is with movement and rated as 2/10    Pertinent History chronic low back pain L5-S1    How long can you sit comfortably? unlimited   How long can you walk comfortably? lower back limits   Diagnostic tests nothing recent   Patient Stated Goals [facility] speech therapist write notes without upper back pain   Currently in Pain? Yes   Pain Score 1    Pain Location Thoracic   Pain Orientation Right   Pain Descriptors / Indicators Tightness   Pain Type Chronic pain   Pain Onset More than a month ago   Pain Frequency Intermittent   Aggravating Factors  typing on tablet, walking dog, arms up   Pain Relieving Factors rest, stretching   Multiple Pain Sites No                          [facility] Adult PT Treatment/Exercise - 07/13/15 0001    Posture/Postural Control   Posture/Postural Control Postural limitations   Postural Limitations Forward head   Exercises   Exercises Lumbar   Neck Exercises: Machines for Strengthening   UBE (Upper Arm Bike) 74min (3/3),sitting on green physioball, with turning head x 5 each min   Neck Exercises: Standing   Upper Extremity D1 --  on hold for now due to hurt herself last time   Other Standing Exercises --  on hold   Other Standing Exercises 25# power tower rows B 10x   Neck Exercises: Seated   Other Seated Exercise thoracic extension over ball with no neck extension 6x   Neck Exercises: Supine   Capital Flexion 10 reps   Other Supine Exercise supine on foam roll for decompression: x 3 minutes.  Horizontal abduction with yellow band 2x10 bil.     Other Supine Exercise FR snowangel x 10, scapular protraction/retraction x 10   Lumbar Exercises: Standing   Row Both;20 reps  25# x 10, 30# x 10   Modalities   Modalities Moist Heat   Moist Heat Therapy   Number Minutes Moist Heat 10 Minutes   Moist Heat Location  Cervical   Manual Therapy   Manual Therapy Soft tissue mobilization;Joint mobilization   Manual therapy comments elongation and TP release to bil cervical paraspinals, SCM, and scalenies rhomboids left shoulder                  PT Short Term Goals - 07/13/15 0817    PT SHORT TERM GOAL #1   Title The patient will demonstrate postural awareness when using the computer  07/07/15   Time 4   Period Weeks   Status Achieved   PT SHORT TERM GOAL #2   Title The patient will have improved cervical sidebending to 30 degrees and rotation to left to 50 degrees needed for driving   Time 4   Period Weeks   Status Achieved   PT SHORT TERM GOAL #3   Title The patient will report a 25% improvement in pain with using the computer at work   Time 4   Period Weeks   Status Achieved            PT Long Term Goals - 07/08/15 0800    PT LONG TERM GOAL #1   Title The patient will be independent in safe self progression of HEP for further gains in ROM and strength   08/04/15   Time 8   Period Weeks   Status On-going   PT LONG TERM GOAL #2   Title The patient will report a 50% improvement in computer work   Time 8   Period Weeks   Status On-going   PT LONG TERM GOAL #3   Title The patient will have deep cervical muscle to 4/5 needed for maintaining better posture while working   Time 8   Period Weeks   Status On-going   PT LONG TERM GOAL #4   Title The patient will have improved periscapular strength to 4/5 needed for walking her dog/managing him when he pulls   Time 8   Period Weeks   Status On-going   PT LONG TERM GOAL #5   Title FOTO functional outcome score improved from 48% to 38% indicating decreased pain and improved function   Time 8   Period Weeks   Status On-going               Plan - 07/13/15 0815    Clinical Impression Statement Pt was able to perform adjusted exercises without increase of pain in left shoulder. All over psotural strength in thoracic and scapular area.    Rehab Potential Good   PT Frequency 2x / week   PT Duration 8 weeks   PT Treatment/Interventions ADLs/Self Care Home Management;Cryotherapy;Electrical Stimulation;Moist Heat;Therapeutic exercise;Ultrasound;Traction;Patient/family education;Manual techniques;Taping;Dry needling   PT Next Visit Plan   postural strengthening;  thoracic extension; cervical flexibility. Manual for mobs and soft tissue   Consulted and Agree with Plan of Care Patient      Patient will benefit from skilled therapeutic intervention in order to improve the following deficits and impairments:  Decreased range of motion, Decreased strength, Hypomobility, Increased muscle spasms, Impaired flexibility, Postural dysfunction, Pain  Visit Diagnosis: Cervicalgia  Muscle weakness (generalized)  Cramp  and spasm     Problem List Patient Active Problem List   Diagnosis Date Noted  . Iron deficiency anemia 05/11/2015  . Allergic sinusitis 06/05/2013  . Muscle spasms of neck trapezious 04/22/2012  . GERD (gastroesophageal reflux disease) 04/22/2012  . Drug interaction 04/22/2012  . BENIGN NEOPLASM OF SKIN SITE UNSPECIFIED 07/25/2009  . DEPRESSION 11/07/2007  .  ALLERGIC RHINITIS 11/07/2007  . 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 GERD 11/07/2007    Perkins,Lisa Gaby PTA 07/13/2015, 8:56 AM  [facility] [address], [place], [place], [address] Phone: [phone]   Fax:  [phone]  Name: Lisa Perkins MRN: [id number] Date of Birth: Mar 24, 1965

## 2015-07-14 ENCOUNTER — Ambulatory Visit: Payer: 59 | Admitting: Physical Therapy

## 2015-07-14 DIAGNOSIS — M542 Cervicalgia: Secondary | ICD-10-CM

## 2015-07-14 DIAGNOSIS — R252 Cramp and spasm: Secondary | ICD-10-CM

## 2015-07-14 DIAGNOSIS — M6281 Muscle weakness (generalized): Secondary | ICD-10-CM

## 2015-07-14 NOTE — Therapy (Signed)
[facility] [address] [place], [place], [address] Phone: [phone]   Fax:  [phone]  Physical Therapy Treatment  Patient Details  Name: Lisa Perkins MRN: [id number] Date of Birth: Jun 30, 1965 Referring Provider: Shanon Ace  Encounter Date: 07/14/2015      PT End of Session - 07/14/15 1614    Visit Number 10   Date for PT Re-Evaluation 08/04/15   Authorization Type UHC   PT Start Time 1536   PT Stop Time [id number]   PT Time Calculation (min) 39 min   Activity Tolerance Patient tolerated treatment well      Past Medical History  Diagnosis Date  . ALLERGIC RHINITIS 11/07/2007  . BENIGN NEOPLASM OF SKIN SITE UNSPECIFIED 07/25/2009  . DEPRESSION 11/07/2007  . GERD 11/07/2007    LPR - laryngopharyngeal reflux  . 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 Asthma as child    Past Surgical History  Procedure Laterality Date  . Wisdom tooth extraction    . Tonsillectomy and adenoidectomy      There were no vitals filed for this visit.      Subjective Assessment - 07/14/15 1537    Subjective Feeling good.  The theraband bothered my shoulder so I didn't do those yesterday.     Currently in Pain? No/denies   Pain Score 0-No pain            [facility] PT Assessment - 07/14/15 0001    Observation/Other Assessments   Focus on Therapeutic Outcomes (FOTO)  30% limitation                     [facility] Adult PT Treatment/Exercise - 07/14/15 0001    Neck Exercises: Machines for Strengthening   Cybex Row seated 15# 20   Other Machines for Strengthening seated lat bar 25# 20x   Neck Exercises: Standing   Neck Retraction 10 reps   Wall Push Ups 10 reps   Upper Extremity D1 10 reps  wall marching   Other Standing Exercises wall UE elevation; press, snow angels 5x each   Other Standing Exercises shoulder extension isometric and wall push ups 10x each   Neck Exercises: Supine   Other Supine Exercise FR snowangel x 10,  scapular protraction/retraction x 10   Neck Exercises: Prone   Other Prone Exercise over red ball  shoulder extensions, horizontal abduction, scaption 10x each   Lumbar Exercises: Quadruped   Opposite Arm/Leg Raise Right arm/Left leg;Left arm/Right leg;5 reps                  PT Short Term Goals - 07/14/15 1822    PT SHORT TERM GOAL #1   Title The patient will demonstrate postural awareness when using the computer  07/07/15   Status Achieved   PT SHORT TERM GOAL #2   Title The patient will have improved cervical sidebending to 30 degrees and rotation to left to 50 degrees needed for driving   Status Achieved   PT SHORT TERM GOAL #3   Title The patient will report a 25% improvement in pain with using the computer at work   Status Achieved           PT Long Term Goals - 07/14/15 1822    PT LONG TERM GOAL #1   Title The patient will be independent in safe self progression of HEP for further gains in ROM and strength   08/04/15   Time 8   Period Weeks  Status On-going   PT LONG TERM GOAL #2   Title The patient will report a 50% improvement in computer work   Time 8   Period Weeks   Status On-going   PT LONG TERM GOAL #3   Title The patient will have deep cervical muscle to 4/5 needed for maintaining better posture while working   Time 8   Period Weeks   Status On-going   PT LONG TERM GOAL #4   Title The patient will have improved periscapular strength to 4/5 needed for walking her dog/managing him when he pulls   Time 8   Period Weeks   Status On-going   PT LONG TERM GOAL #5   Title FOTO functional outcome score improved from 48% to 38% indicating decreased pain and improved function   Status Achieved               Plan - 07/14/15 1819    Clinical Impression Statement The patient was able to perform a moderate level intensity ex program without pain exacerbation of neck, scapula or shoulder.  Minimal cues for postural alignment.  Excellent improvement  in FOTO functional outcome level.  Should meet remaining goals in 2 visits.     PT Next Visit Plan   postural strengthening;  thoracic extension; cervical flexibility. Manual for mobs and soft tissue      Patient will benefit from skilled therapeutic intervention in order to improve the following deficits and impairments:     Visit Diagnosis: Cervicalgia  Muscle weakness (generalized)  Cramp and spasm     Problem List Patient Active Problem List   Diagnosis Date Noted  . Iron deficiency anemia 05/11/2015  . Allergic sinusitis 06/05/2013  . Muscle spasms of neck trapezious 04/22/2012  . GERD (gastroesophageal reflux disease) 04/22/2012  . Drug interaction 04/22/2012  . BENIGN NEOPLASM OF SKIN SITE UNSPECIFIED 07/25/2009  . DEPRESSION 11/07/2007  . ALLERGIC RHINITIS 11/07/2007  . 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 GERD 11/07/2007    Ruben Im, PT 07/14/2015 6:24 PM Phone: [phone] Fax: [phone] Alvera Singh 07/14/2015, 6:24 PM  [facility] [address] [place], [place], [address] Phone: [phone]   Fax:  [phone]  Name: Lisa Perkins MRN: [id number] Date of Birth: 1966/02/02

## 2015-07-15 ENCOUNTER — Encounter: Payer: 59 | Admitting: Physical Therapy

## 2015-07-20 ENCOUNTER — Ambulatory Visit: Payer: 59 | Admitting: Physical Therapy

## 2015-07-20 ENCOUNTER — Encounter: Payer: Self-pay | Admitting: Physical Therapy

## 2015-07-20 DIAGNOSIS — R252 Cramp and spasm: Secondary | ICD-10-CM

## 2015-07-20 DIAGNOSIS — M542 Cervicalgia: Secondary | ICD-10-CM

## 2015-07-20 DIAGNOSIS — M6281 Muscle weakness (generalized): Secondary | ICD-10-CM

## 2015-07-20 NOTE — Therapy (Addendum)
[facility] [address] [place], [place], [address] Phone: [phone]   Fax:  [phone]  Physical Therapy Treatment/Discharge Summary  Patient Details  Name: Lisa Perkins MRN: [id number] Date of Birth: 06-25-1965 Referring Provider: Shanon Ace  Encounter Date: 07/20/2015      PT End of Session - 07/20/15 0941    Visit Number 11   Date for PT Re-Evaluation 08/04/15   Authorization Type UHC   PT Start Time 0930   PT Stop Time 1015   PT Time Calculation (min) 45 min   Activity Tolerance Patient tolerated treatment well   Behavior During Therapy [facility] for tasks assessed/performed      Past Medical History  Diagnosis Date  . ALLERGIC RHINITIS 11/07/2007  . BENIGN NEOPLASM OF SKIN SITE UNSPECIFIED 07/25/2009  . DEPRESSION 11/07/2007  . GERD 11/07/2007    LPR - laryngopharyngeal reflux  . 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 Asthma as child    Past Surgical History  Procedure Laterality Date  . Wisdom tooth extraction    . Tonsillectomy and adenoidectomy      There were no vitals filed for this visit.      Subjective Assessment - 07/20/15 0940    Subjective Pt reports no complain of pain and she wishes to be D/C    Pertinent History chronic low back pain L5-S1    How long can you sit comfortably? unlimited   How long can you walk comfortably? lower back limits   Diagnostic tests nothing recent   Patient Stated Goals [facility] speech therapist write notes without upper back pain   Currently in Pain? No/denies            [facility] PT Assessment - 07/20/15 0001    Observation/Other Assessments   Focus on Therapeutic Outcomes (FOTO)  30% limitation   Posture/Postural Control   Posture/Postural Control Postural limitations   Postural Limitations Forward head   AROM   Cervical Extension 70   Cervical - Right Side Bend 35   Cervical - Left Side Bend 40   Cervical - Right Rotation 45   Cervical - Left Rotation 45   Strength   Overall Strength Comments middle and lower traps 4+/5   Cervical Flexion 4+/5   Cervical Extension 4+/5                     [facility] Adult PT Treatment/Exercise - 07/20/15 0001    Neck Exercises: Machines for Strengthening   UBE (Upper Arm Bike) 38mn (3/3),sitting on green physioball, with turning head x 5 each min   Cybex Row seated 15# 20   Other Machines for Strengthening seated lat bar 30# 20x  pt tolerated incr of weight well   Neck Exercises: Standing   Neck Retraction 10 reps   Neck Exercises: Supine   Other Supine Exercise FR snowangel x 10, scapular protraction/retraction x 10   Manual Therapy   Manual Therapy Soft tissue mobilization;Joint mobilization   Manual therapy comments elongation and TP release to bil cervical paraspinals, SCM, and scalenies rhomboids left shoulder                  PT Short Term Goals - 07/14/15 1822    PT SHORT TERM GOAL #1   Title The patient will demonstrate postural awareness when using the computer  07/07/15   Status Achieved   PT SHORT TERM GOAL #2   Title The patient will have improved cervical sidebending to  30 degrees and rotation to left to 50 degrees needed for driving   Status Achieved   PT SHORT TERM GOAL #3   Title The patient will report a 25% improvement in pain with using the computer at work   Status Achieved           PT Nathalie - 07/20/15 [place] #1   Title The patient will be independent in safe self progression of HEP for further gains in ROM and strength   08/04/15   Time 8   Period Weeks   Status Achieved   PT LONG TERM GOAL #2   Title The patient will report a 50% improvement in computer work   Time 8   Period Weeks   Status Achieved   PT LONG TERM GOAL #4   Title The patient will have improved periscapular strength to 4/5 needed for walking her dog/managing him when he pulls   Time 8   Period Weeks   Status Achieved   PT LONG TERM GOAL #5   Title  FOTO functional outcome score improved from 48% to 38% indicating decreased pain and improved function   Time 8   Period Weeks   Status Achieved               Plan - 07/20/15 0948    Clinical Impression Statement Pt continues to toleratew moderate level intensity ex program without pain. Pt is D/D to HEP. We discussed options for continueing to work out at a fitness center.     Rehab Potential Good   PT Frequency 2x / week   PT Duration 8 weeks   PT Treatment/Interventions ADLs/Self Care Home Management;Cryotherapy;Electrical Stimulation;Moist Heat;Therapeutic exercise;Ultrasound;Traction;Patient/family education;Manual techniques;Taping;Dry needling   PT Next Visit Plan D/C   Consulted and Agree with Plan of Care Patient      Patient will benefit from skilled therapeutic intervention in order to improve the following deficits and impairments:  Decreased range of motion, Decreased strength, Hypomobility, Increased muscle spasms, Impaired flexibility, Postural dysfunction, Pain  Visit Diagnosis: Cervicalgia  Muscle weakness (generalized)  Cramp and spasm    PHYSICAL THERAPY DISCHARGE SUMMARY  Visits from Start of Care: 11  Current functional level related to goals / functional outcomes: The patient made good progress with postural strengthening, pain reduction and return to function.  All rehab goals were met.  She is independent in her HEP for further improvements.   Remaining deficits: As above   Education / Equipment: HEP Plan: Patient agrees to discharge.  Patient goals were met. Patient is being discharged due to meeting the stated rehab goals.  ?????        Problem List Patient Active Problem List   Diagnosis Date Noted  . Iron deficiency anemia 05/11/2015  . Allergic sinusitis 06/05/2013  . Muscle spasms of neck trapezious 04/22/2012  . GERD (gastroesophageal reflux disease) 04/22/2012  . Drug interaction 04/22/2012  . BENIGN NEOPLASM OF SKIN SITE  UNSPECIFIED 07/25/2009  . DEPRESSION 11/07/2007  . ALLERGIC RHINITIS 11/07/2007  . 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 GERD 11/07/2007   Ruben Im, PT 07/21/2015 8:33 AM Phone: [phone] Fax: [phone]  NAUMANN-HOUEGNIFIO,Yaslene Lindamood PTA 07/20/2015, 10:26 AM  [facility] [address] [place], [place], [address] Phone: [phone]   Fax:  [phone]  Name: Lisa Perkins MRN: [id number] Date of Birth: [birth year]

## 2015-07-21 ENCOUNTER — Encounter: Payer: 59 | Admitting: Physical Therapy

## 2015-07-22 ENCOUNTER — Encounter: Payer: 59 | Admitting: Physical Therapy

## 2015-08-10 ENCOUNTER — Other Ambulatory Visit: Payer: 59

## 2015-08-10 ENCOUNTER — Ambulatory Visit: Payer: 59 | Admitting: Hematology & Oncology

## 2015-10-31 ENCOUNTER — Encounter: Payer: Self-pay | Admitting: Nurse Practitioner

## 2015-10-31 NOTE — Progress Notes (Signed)
Patient ID: Lisa Perkins, female   DOB: 04/08/1965, 50 y.o.   MRN: [id number]  50 y.o. G0P0000 Married  Caucasian Fe here for annual exam.  Menses are still irregular at 21-30 days. Flow for 4-5 days, moderate to light. Some cramps.  No new diagnosis.  Recent labs for anemia and will be repeated today.  Patient's last menstrual period was 10/19/2015 (exact date).          Sexually active: No. The current method of family planning is abstinence and vasectomy.    Exercising: No.  The patient does not participate in regular exercise at present. Smoker:  no  Health Maintenance: Pap: 10/22/14, Negative with neg HR HPV MMG: 06/06/15, Bi-Rads 1: Negative Colonoscopy: 12/29/14, Normal, return in 10 years for regular screening TDaP: 01/26/10 HIV: has been done 07/2001 Labs: done here   reports that she has never smoked. She has never used smokeless tobacco. She reports that she drinks alcohol. She reports that she does not use drugs.  Past Medical History:  Diagnosis Date  . ALLERGIC RHINITIS 11/07/2007  . Asthma as child  . BENIGN NEOPLASM OF SKIN SITE UNSPECIFIED 07/25/2009  . DEPRESSION 11/07/2007  . GERD 11/07/2007   LPR - laryngopharyngeal reflux  . 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 TONSILLECTOMY AND ADENOIDECTOMY    . WISDOM TOOTH EXTRACTION      Current Outpatient Prescriptions  Medication Sig Dispense Refill  . albuterol (PROVENTIL HFA;VENTOLIN HFA) 108 (90 BASE) MCG/ACT inhaler Inhale 2 puffs into the lungs every 6 (six) hours as needed for wheezing or shortness of breath. 1 Inhaler 2  . citalopram (CELEXA) 20 MG tablet Take 1.5 tablets by mouth daily.    . clonazePAM (KLONOPIN) 0.5 MG tablet Take 0.5 mg by mouth at bedtime as needed.     Marland Kitchen ibuprofen (ADVIL,MOTRIN) 200 MG tablet Take 200 mg by mouth every 6 (six) hours as needed.    . lamoTRIgine (LAMICTAL) 100 MG tablet Take 1 tablet by mouth daily.    . lansoprazole (PREVACID) 30 MG  capsule     . olopatadine (PATANOL) 0.1 % ophthalmic solution Place 1 drop into both eyes 2 (two) times daily. 5 mL 3  . pseudoephedrine-acetaminophen (TYLENOL SINUS) 30-500 MG TABS tablet Take 1 tablet by mouth every 4 (four) hours as needed.     No current facility-administered medications for this visit.     Family History  Problem Relation Age of Onset  . Endometriosis Mother   . Hypertension Mother   . Diabetes Maternal Grandmother   . Hypertension Maternal Grandmother   . Stroke Maternal Grandmother   . COPD Father   . Dementia Father   . Hypertension Maternal Grandfather   . Stroke Maternal Grandfather   . Stroke Paternal Grandmother   . Stroke Paternal Grandfather   . Diabetes Maternal Uncle   . Breast cancer Paternal Aunt   . Asthma Neg Hx     family hx    ROS:  Pertinent items are noted in HPI.  Otherwise, a comprehensive ROS was negative.  Exam:   BP 100/64 (BP Location: Right Arm, Patient Position: Sitting, Cuff Size: Normal)   Pulse 60   Ht 5' 5.75" (1.67 m)   Wt 143 lb (64.9 kg)   LMP 10/19/2015 (Exact Date)   BMI 23.26 kg/m  Height: 5' 5.75" (167 cm) Ht Readings from Last 3 Encounters:  11/01/15 5' 5.75" (1.67 m)  05/11/15 5\' 5"  (1.651  m)  02/07/15 5\' 5"  (1.651 m)    General appearance: alert, cooperative and appears stated age Head: Normocephalic, without obvious abnormality, atraumatic Neck: no adenopathy, supple, symmetrical, trachea midline and thyroid normal to inspection and palpation Lungs: clear to auscultation bilaterally Breasts: normal appearance, no masses or tenderness Heart: regular rate and rhythm Abdomen: soft, non-tender; no masses,  no organomegaly Extremities: extremities normal, atraumatic, no cyanosis or edema Skin: Skin color, texture, turgor normal. No rashes or lesions Lymph nodes: Cervical, supraclavicular, and axillary nodes normal. No abnormal inguinal nodes palpated Neurologic: Grossly normal   Pelvic: External  genitalia:  no lesions              Urethra:  normal appearing urethra with no masses, tenderness or lesions              Bartholin's and Skene's: normal                 Vagina: normal appearing vagina with normal color and discharge, no lesions              Cervix: anteverted              Pap taken: No. Bimanual Exam:  Uterus:  normal size, contour, position, consistency, mobility, non-tender              Adnexa: no mass, fullness, tenderness               Rectovaginal: Confirms               Anus:  normal sphincter tone, no lesions  Chaperone present: yes  A:  Well Woman with normal exam             Perimenopausal consistent with irregular menses  Anemia - iron deficiency  History of GERD  History of panic attacks/PTSD/generalized anxiety disorder   P:   Reviewed health and wellness pertinent to exam  Pap smear as above  Mammogram is due 05/2016  Follow with labs  Track menses calendar  Counseled on breast self exam, mammography screening, adequate intake of calcium and vitamin D, diet and exercise return annually or prn  An After Visit Summary was printed and given to the patient.

## 2015-11-01 ENCOUNTER — Encounter: Payer: Self-pay | Admitting: Nurse Practitioner

## 2015-11-01 ENCOUNTER — Ambulatory Visit (INDEPENDENT_AMBULATORY_CARE_PROVIDER_SITE_OTHER): Payer: BLUE CROSS/BLUE SHIELD | Admitting: Nurse Practitioner

## 2015-11-01 VITALS — BP 100/64 | HR 60 | Ht 65.75 in | Wt 143.0 lb

## 2015-11-01 DIAGNOSIS — Z01419 Encounter for gynecological examination (general) (routine) without abnormal findings: Secondary | ICD-10-CM

## 2015-11-01 DIAGNOSIS — D509 Iron deficiency anemia, unspecified: Secondary | ICD-10-CM

## 2015-11-01 DIAGNOSIS — Z0000 Encounter for general adult medical examination without abnormal findings: Secondary | ICD-10-CM

## 2015-11-01 LAB — LIPID PANEL
Cholesterol: 171 mg/dL (ref 125–200)
HDL: 69 mg/dL (ref >=46)
LDL Cholesterol: 78 mg/dL (ref <130)
Total CHOL/HDL Ratio: 2.5 Ratio (ref <=5.0)
Triglycerides: 122 mg/dL (ref <150)
VLDL: 24 mg/dL (ref <30)

## 2015-11-01 LAB — COMPREHENSIVE METABOLIC PANEL
ALT: 8 U/L (ref 6–29)
AST: 13 U/L (ref 10–35)
Albumin: 4.4 g/dL (ref 3.6–5.1)
Alkaline Phosphatase: 31 U/L — ABNORMAL LOW (ref 33–115)
BUN: 8 mg/dL (ref 7–25)
CO2: 29 mmol/L (ref 20–31)
Calcium: 9.6 mg/dL (ref 8.6–10.2)
Chloride: 98 mmol/L (ref 98–110)
Creat: 0.93 mg/dL (ref 0.50–1.10)
Glucose, Bld: 88 mg/dL (ref 65–99)
Potassium: 4.4 mmol/L (ref 3.5–5.3)
Sodium: 134 mmol/L — ABNORMAL LOW (ref 135–146)
Total Bilirubin: 0.4 mg/dL (ref 0.2–1.2)
Total Protein: 6.6 g/dL (ref 6.1–8.1)

## 2015-11-01 LAB — CBC
HCT: 39 % (ref 35.0–45.0)
Hemoglobin: 12.8 g/dL (ref 11.7–15.5)
MCH: 31.9 pg (ref 27.0–33.0)
MCHC: 32.8 g/dL (ref 32.0–36.0)
MCV: 97.3 fL (ref 80.0–100.0)
MPV: 8.7 fL (ref 7.5–12.5)
Platelets: 311 10*3/uL (ref 140–400)
RBC: 4.01 MIL/uL (ref 3.80–5.10)
RDW: 13.1 % (ref 11.0–15.0)
WBC: 6.2 10*3/uL (ref 3.8–10.8)

## 2015-11-01 LAB — IBC PANEL
%SAT: 45 % (ref 11–50)
TIBC: 271 ug/dL (ref 250–450)
UIBC: 149 ug/dL (ref 125–400)

## 2015-11-01 LAB — IRON: Iron: 122 ug/dL (ref 40–190)

## 2015-11-01 LAB — TSH: TSH: 1.88 mIU/L

## 2015-11-01 NOTE — Patient Instructions (Signed)

## 2015-11-02 LAB — VITAMIN D 25 HYDROXY (VIT D DEFICIENCY, FRACTURES): Vit D, 25-Hydroxy: 32 ng/mL (ref 30–100)

## 2015-11-02 LAB — FERRITIN: Ferritin: 67 ng/mL (ref 10–232)

## 2015-11-06 NOTE — Progress Notes (Signed)
Encounter reviewed by Dr. Allianna Beaubien Amundson C. Silva.  

## 2015-11-07 ENCOUNTER — Ambulatory Visit (INDEPENDENT_AMBULATORY_CARE_PROVIDER_SITE_OTHER): Payer: BLUE CROSS/BLUE SHIELD | Admitting: Family Medicine

## 2015-11-07 ENCOUNTER — Encounter: Payer: Self-pay | Admitting: Family Medicine

## 2015-11-07 VITALS — BP 100/72 | HR 86 | Temp 98.3°F | Resp 16 | Ht 66.0 in | Wt 146.0 lb

## 2015-11-07 DIAGNOSIS — J320 Chronic maxillary sinusitis: Secondary | ICD-10-CM

## 2015-11-07 MED ORDER — PREDNISONE 10 MG PO TABS: ORAL_TABLET | ORAL | 0 refills | Status: DC

## 2015-11-07 NOTE — Progress Notes (Signed)
Pre visit review using our clinic review tool, if applicable. No additional management support is needed unless otherwise documented below in the visit note. 

## 2015-11-07 NOTE — Patient Instructions (Signed)
Please take prednisone as prescribed with food. Also you may use Mucinex DM for symptoms. If your symptoms do not improve, worsen, or you develop a fever >100, please follow up for further evaluation.   Sinusitis, Adult Sinusitis is redness, soreness, and inflammation of the paranasal sinuses. Paranasal sinuses are air pockets within the bones of your face. They are located beneath your eyes, in the middle of your forehead, and above your eyes. In healthy paranasal sinuses, mucus is able to drain out, and air is able to circulate through them by way of your nose. However, when your paranasal sinuses are inflamed, mucus and air can become trapped. This can allow bacteria and other germs to grow and cause infection. Sinusitis can develop quickly and last only a short time (acute) or continue over a long period (chronic). Sinusitis that lasts for more than 12 weeks is considered chronic. CAUSES Causes of sinusitis include:  Allergies.  Structural abnormalities, such as displacement of the cartilage that separates your nostrils (deviated septum), which can decrease the air flow through your nose and sinuses and affect sinus drainage.  Functional abnormalities, such as when the small hairs (cilia) that line your sinuses and help remove mucus do not work properly or are not present. SIGNS AND SYMPTOMS Symptoms of acute and chronic sinusitis are the same. The primary symptoms are pain and pressure around the affected sinuses. Other symptoms include:  Upper toothache.  Earache.  Headache.  Bad breath.  Decreased sense of smell and taste.  A cough, which worsens when you are lying flat.  Fatigue.  Fever.  Thick drainage from your nose, which often is green and may contain pus (purulent).  Swelling and warmth over the affected sinuses. DIAGNOSIS Your health care provider will perform a physical exam. During your exam, your health care provider may perform any of the following to help  determine if you have acute sinusitis or chronic sinusitis:  Look in your nose for signs of abnormal growths in your nostrils (nasal polyps).  Tap over the affected sinus to check for signs of infection.  View the inside of your sinuses using an imaging device that has a light attached (endoscope). If your health care provider suspects that you have chronic sinusitis, one or more of the following tests may be recommended:  Allergy tests.  Nasal culture. A sample of mucus is taken from your nose, sent to a lab, and screened for bacteria.  Nasal cytology. A sample of mucus is taken from your nose and examined by your health care provider to determine if your sinusitis is related to an allergy. TREATMENT Most cases of acute sinusitis are related to a viral infection and will resolve on their own within 10 days. Sometimes, medicines are prescribed to help relieve symptoms of both acute and chronic sinusitis. These may include pain medicines, decongestants, nasal steroid sprays, or saline sprays. However, for sinusitis related to a bacterial infection, your health care provider will prescribe antibiotic medicines. These are medicines that will help kill the bacteria causing the infection. Rarely, sinusitis is caused by a fungal infection. In these cases, your health care provider will prescribe antifungal medicine. For some cases of chronic sinusitis, surgery is needed. Generally, these are cases in which sinusitis recurs more than 3 times per year, despite other treatments. HOME CARE INSTRUCTIONS  Drink plenty of water. Water helps thin the mucus so your sinuses can drain more easily.  Use a humidifier.  Inhale steam 3-4 times a day (for example,  sit in the bathroom with the shower running).  Apply a warm, moist washcloth to your face 3-4 times a day, or as directed by your health care provider.  Use saline nasal sprays to help moisten and clean your sinuses.  Take medicines only as  directed by your health care provider.  If you were prescribed either an antibiotic or antifungal medicine, finish it all even if you start to feel better. SEEK IMMEDIATE MEDICAL CARE IF:  You have increasing pain or severe headaches.  You have nausea, vomiting, or drowsiness.  You have swelling around your face.  You have vision problems.  You have a stiff neck.  You have difficulty breathing.   This information is not intended to replace advice given to you by your health care provider. Make sure you discuss any questions you have with your health care provider.   Document Released: 02/05/2005 Document Revised: 02/26/2014 Document Reviewed: 02/20/2011 Elsevier Interactive Patient Education [employer].

## 2015-11-07 NOTE — Progress Notes (Signed)
Subjective:    Patient ID: Lisa Perkins, female    DOB: 1965/07/02, 50 y.o.   MRN: [id number]  HPI  Lisa Perkins is a 50 year old female who presents today with sinus pressure/pain and sore throat that has been present for 3 days.  Associated symptom of rhinitis, nasal congestion/obstruction, post nasal drip, and nonproductive cough. She reports that her sore throat is improving. Denies fever, chills, sweats, N/V/D, ear pain, tooth pain, recent antibiotic therapy, or recent sick contact exposure. History of Laryngeal/oropharyngel reflux that is triggered by certain foods/drinks. She reports recently drinking coffee which has triggered her recent episode. She is followed by GI for this issue and has had an upper endoscopy within the past year that was normal. She denies a history of asthma/bronchitis. She takes Prevacid for reflux which provides great benefit.  Review of Systems  Constitutional: Negative for chills, fatigue and fever.  HENT: Positive for postnasal drip, rhinorrhea and sinus pressure.   Respiratory: Positive for cough. Negative for shortness of breath and wheezing.   Cardiovascular: Negative for chest pain and palpitations.  Gastrointestinal: Negative for abdominal pain, diarrhea, nausea and vomiting.  Genitourinary: Negative for dysuria.  Musculoskeletal: Negative for myalgias.  Skin: Negative for rash.  Neurological: Negative for dizziness and headaches.   Past Medical History:  Diagnosis Date  . ALLERGIC RHINITIS 11/07/2007  . Asthma as child  . BENIGN NEOPLASM OF SKIN SITE UNSPECIFIED 07/25/2009  . DEPRESSION 11/07/2007  . GERD 11/07/2007   LPR - laryngopharyngeal reflux  . Oral aphthae 11/07/2007     Social History   Social History  . Marital status: Married    Spouse name: N/A  . Number of children: N/A  . Years of education: N/A   Occupational History  . Not on file.   Social History Main Topics  . Smoking status: Never Smoker  . Smokeless tobacco:  Never Used  . Alcohol use 0.0 oz/week     Comment: 1 drink per month  . Drug use: No  . Sexual activity: Not Currently   Other Topics Concern  . 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    Non smoker     Past Surgical History:  Procedure Laterality Date  . TONSILLECTOMY AND ADENOIDECTOMY    . WISDOM TOOTH EXTRACTION      Family History  Problem Relation Age of Onset  . Endometriosis Mother   . Hypertension Mother   . Diabetes Maternal Grandmother   . Hypertension Maternal Grandmother   . Stroke Maternal Grandmother   . COPD Father   . Dementia Father   . Hypertension Maternal Grandfather   . Stroke Maternal Grandfather   . Stroke Paternal Grandmother   . Stroke Paternal Grandfather   . Diabetes Maternal Uncle   . Breast cancer Paternal Aunt 10    bilateral mastectomy - survivor  . Asthma Neg Hx     family hx    Allergies  Allergen Reactions  . Amoxicillin     REACTION: nausea  gi signs  . Erythromycin     Current Outpatient Prescriptions on File Prior to Visit  Medication Sig Dispense Refill  . citalopram (CELEXA) 20 MG tablet Take 1.5 tablets by mouth daily.    . clonazePAM (KLONOPIN) 0.5 MG tablet Take 0.5 mg by mouth at bedtime as needed.     Marland Kitchen ibuprofen (ADVIL,MOTRIN) 200 MG tablet Take 200 mg by mouth every  6 (six) hours as needed.    . lamoTRIgine (LAMICTAL) 100 MG tablet Take 1 tablet by mouth daily.    . lansoprazole (PREVACID) 30 MG capsule     . olopatadine (PATANOL) 0.1 % ophthalmic solution Place 1 drop into both eyes 2 (two) times daily. 5 mL 3  . pseudoephedrine-acetaminophen (TYLENOL SINUS) 30-500 MG TABS tablet Take 1 tablet by mouth every 4 (four) hours as needed.    Marland Kitchen albuterol (PROVENTIL HFA;VENTOLIN HFA) 108 (90 BASE) MCG/ACT inhaler Inhale 2 puffs into the lungs every 6 (six) hours as needed for wheezing or shortness of breath. (Patient not taking: Reported on 11/07/2015) 1 Inhaler 2   No  current facility-administered medications on file prior to visit.     BP 100/72   Pulse 86   Temp 98.3 F (36.8 C) (Oral)   Resp 16   Ht 5\' 6"  ([id number] m)   Wt 146 lb (66.2 kg)   LMP 10/19/2015 (Exact Date)   SpO2 99%   BMI 23.57 kg/m        Objective:   Physical Exam  Constitutional: She is oriented to person, place, and time. She appears well-developed and well-nourished.  HENT:  Right Ear: Tympanic membrane normal.  Left Ear: Tympanic membrane normal.  Nose: Rhinorrhea present. Right sinus exhibits maxillary sinus tenderness. Right sinus exhibits no frontal sinus tenderness. Left sinus exhibits maxillary sinus tenderness. Left sinus exhibits no frontal sinus tenderness.  Mouth/Throat: Mucous membranes are normal. No oropharyngeal exudate or posterior oropharyngeal erythema.  Cerumen noted in right TM.   Eyes: Pupils are equal, round, and reactive to light. No scleral icterus.  Neck: Neck supple.  Cardiovascular: Normal rate and regular rhythm.   Pulmonary/Chest: Effort normal and breath sounds normal. She has no wheezes. She has no rales.  Lymphadenopathy:    She has no cervical adenopathy.  Neurological: She is alert and oriented to person, place, and time.  Skin: Skin is warm and dry. No rash noted.  Psychiatric: She has a normal mood and affect. Her behavior is normal. Judgment and thought content normal.      Assessment & Plan:  1. Maxillary sinusitis, unspecified chronicity History of larynge/oropharyngeal reflux that triggers sinus pressure/pain. No concern for bacterial infection at this time.  Advised monitoring with written information provided. No fever, increasing pain, extended duration of symptoms, or purulent drainage present. With history of nasal/sinus congestion following triggers for her laryngeal/oropharyngeal reflux, advise short term tapering dose of prednisone to promote passage nasal drainage and improve congestion. Further advised Mucinex DM for  symptoms also. If symptoms do not improve, worsen, or she develops a fever >100 or increasing sinus pain, she will follow up for further evaluation in 3 to 4 days.  - predniSONE (DELTASONE) 10 MG tablet; Take 4 tablets once daily for 2 days, 3 tabs daily for 2 days, 2 tabs for 2 days, 1 tab for 2 days.  Dispense: 20 tablet; Refill: 0  Delano Metz, FNP-C

## 2015-11-09 ENCOUNTER — Telehealth: Payer: Self-pay | Admitting: Internal Medicine

## 2015-11-09 NOTE — Telephone Encounter (Signed)
Follow up from triage call Please Advise

## 2015-11-09 NOTE — Telephone Encounter (Signed)
Patient Name: Lisa Perkins  DOB: 19-Jun-1965    Initial Comment Caller states she was put on Prednisone and heart has been racing. Feels like itis hard to breathe as well    Nurse Assessment  Nurse: Mancel Bale, RN, Cayla Date/Time (Eastern Time): 11/09/2015 1:46:53 PM  Confirm and document reason for call. If symptomatic, describe symptoms. You must click the next button to save text entered. ---Caller states that she started taking Prednisone on the 18th. She has had three doses. Caller states that she feels like her chest is tight and her heart is racing. The Caller's heart rate is 94.  Has the patient traveled out of the country within the last 30 days? ---No  Does the patient have any new or worsening symptoms? ---Yes  Will a triage be completed? ---Yes  Related visit to physician within the last 2 weeks? ---Yes  Does the PT have any chronic conditions? (i.e. diabetes, asthma, etc.) ---Yes  List chronic conditions. ---Asthma, Depression and Anxiety, and Lorengal Reflux  Is the patient pregnant or possibly pregnant? (Ask all females between the ages of 22-55) ---No  Is this a behavioral health or substance abuse call? ---No     Guidelines    Guideline Title Affirmed Question Affirmed Notes  Chest Pain [1] Chest pain lasts > 5 minutes AND [2] described as crushing, pressure-like, or heavy    Final Disposition User   Call EMS 911 Now Mancel Bale, RN, Cayla    Comments  Caller states that she is going to take a clonezpam to see if she feels better instead of calling EMS   Disagree/Comply: Disagree  Disagree/Comply Reason: Wait and see

## 2015-11-09 NOTE — Telephone Encounter (Signed)
Spoke to the pt and informed her to stop prednisone.  She is not currently taking decongestants or antihistamines.  Agreed to ED if needed/ongoing elevated hr.

## 2015-11-09 NOTE — Telephone Encounter (Signed)
Please Advise

## 2015-11-09 NOTE — Telephone Encounter (Signed)
I would stop the prednisone and any decongestants she may be taking or antihistamines  .  If heart rate is 120 and over  Needs to be evaluated . Of if on going  To ed as advised

## 2015-11-09 NOTE — Telephone Encounter (Signed)
See message answered after triage  note

## 2015-11-09 NOTE — Telephone Encounter (Signed)
Pt state that she only took 2 pills the first day and 1 on the next day and her heart is racing.  Pt was transferred to [facility].

## 2015-11-09 NOTE — Telephone Encounter (Signed)
Left a message for a return call.

## 2016-04-26 NOTE — Progress Notes (Signed)
Chief Complaint  Patient presents with  . Panic Attack    HPI: Lisa Perkins 51 y.o.  comesinComes in today cause concern about  Panic problem she is under care for medications from has been tearing counseling [facility]. However when she called them recently they didn't have any appointment for over 2 weeks. She is on a callback list. However she is very disconcerted that she has had 2 panic attacks in the last few weeks. It is affecting her ability to work is concerned about what she can do. One was driving to see her father in [place] she had to pull over and actually went to the emergency room but by the time she got there was feeling some better. Was told she was dehydrated with borderline low potassium and given pills. Her EKG was normal. She has been having some hot flushes and wonders if menopause could be triggering this she hasn't had really a panic attack like this for over 2 years. No recent change in medication. She has clonazepam but try not to take it because it makes her go to sleep. Even if she takes a quarter of a half at milligram. Not sure what to do but somewhat distressed. 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 No new medical changes.   Sees [facility] on a regular basis. Her hot flashes she states are tolerable but wonders if the perimenopause is triggering her panic or whether medicine should be adjusted.  ROS: See pertinent positives and negatives per HPI.  Past Medical History:  Diagnosis Date  . ALLERGIC RHINITIS 11/07/2007  . Asthma as child  . BENIGN NEOPLASM OF SKIN SITE UNSPECIFIED 07/25/2009  . DEPRESSION 11/07/2007  . GERD 11/07/2007   LPR - laryngopharyngeal reflux  . 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 Endometriosis Mother   . Hypertension Mother   . Diabetes Maternal Grandmother   . Hypertension Maternal Grandmother   . Stroke Maternal Grandmother   . COPD Father   .  Dementia Father   . Hypertension Maternal Grandfather   . Stroke Maternal Grandfather   . Stroke Paternal Grandmother   . Stroke Paternal Grandfather   . Diabetes Maternal Uncle   . Breast cancer Paternal Aunt 72    bilateral mastectomy - survivor  . Asthma Neg Hx     family hx    Social History   Social History  . Marital status: Married    Spouse name: N/A  . Number of children: N/A  . Years of education: N/A   Social History Main Topics  . Smoking status: Never Smoker  . Smokeless tobacco: Never Used  . Alcohol use 0.0 oz/week     Comment: 1 drink per month  . Drug use: No  . Sexual activity: Not Currently   Other Topics Concern  . 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    Non smoker     Outpatient Medications Prior to Visit  Medication Sig Dispense Refill  . albuterol (PROVENTIL HFA;VENTOLIN HFA) 108 (90 BASE) MCG/ACT inhaler Inhale 2 puffs into the lungs every 6 (six) hours as needed for wheezing or shortness of breath. (Patient not taking: Reported on 11/07/2015) 1 Inhaler 2  . citalopram (CELEXA) 20 MG tablet Take 1.5 tablets by mouth daily.    . clonazePAM (KLONOPIN) 0.5 MG tablet Take  0.5 mg by mouth at bedtime as needed.     Marland Kitchen ibuprofen (ADVIL,MOTRIN) 200 MG tablet Take 200 mg by mouth every 6 (six) hours as needed.    . lamoTRIgine (LAMICTAL) 100 MG tablet Take 1 tablet by mouth daily.    . lansoprazole (PREVACID) 30 MG capsule     . olopatadine (PATANOL) 0.1 % ophthalmic solution Place 1 drop into both eyes 2 (two) times daily. 5 mL 3  . predniSONE (DELTASONE) 10 MG tablet Take 4 tablets once daily for 2 days, 3 tabs daily for 2 days, 2 tabs for 2 days, 1 tab for 2 days. 20 tablet 0  . pseudoephedrine-acetaminophen (TYLENOL SINUS) 30-500 MG TABS tablet Take 1 tablet by mouth every 4 (four) hours as needed.     No facility-administered medications prior to visit.      EXAM:  BP 120/80 (BP Location: Right Arm,  Patient Position: Sitting, Cuff Size: Normal)   Pulse 79   Temp 98 F (36.7 C) (Oral)   Ht 5\' 6"  (1.676 m)   Wt 149 lb (67.6 kg)   BMI 24.05 kg/m   Body mass index is 24.05 kg/m.  GENERAL: vitals reviewed and listed above, alert, oriented, appears well hydrated and in no acute distressMildly to moderately anxious t normal thought and speech. HEENT: atraumatic, conjunctiva  clear, no obvious abnormalities on inspection of external nose and ears   Wt Readings from Last 3 Encounters:  04/27/16 149 lb (67.6 kg)  11/07/15 146 lb (66.2 kg)  11/01/15 143 lb (64.9 kg)  Reviewed medication and history with patient.  ASSESSMENT AND PLAN:  Discussed the following assessment and plan:  Panic attacks - Plan: Basic metabolic panel, Magnesium  Hypokalemia - Plan: Basic metabolic panel, Magnesium  Medication management  Recurrence of panic attacks affecting her work situation.causing more anxiety  Under psychiatric care on a number of medicines. One option would be to increase her to citalopram to 40 mg  But  hesitant to do this without her psychiatrist being involved because of medicine interactions. Discussed the clonazepam can help but makes her too drowsy to drive.  Also she may be right in that perimenopause may add to the situation. Please have her get appointment with her gynecology group to discuss possibilities of hormonal therapy coordinated with her psychiatrist.  [facility] counseling center before noon today but they were closed all day today. We'll try on Monday to help facilitate care.  We will recheck her potassium and magnesium today to make sure they are in range. Total visit 74mins > 50% spent counseling and coordinating care as indicated in above note and in instructions to patient .   -Patient advised to return or notify health care team  if symptoms worsen ,persist or new concerns arise.  Patient Instructions  Make appt with [facility]? If hormonal   Therapy may be helpful   We will contact   [facility]  .   About  Getting you in for earlier evaluation and what to do for you panic recurrence .   ? Increase celexa  Should be from them.     Standley Brooking. Ramsey Guadamuz M.D.

## 2016-04-27 ENCOUNTER — Encounter: Payer: Self-pay | Admitting: Internal Medicine

## 2016-04-27 ENCOUNTER — Telehealth: Payer: Self-pay | Admitting: Nurse Practitioner

## 2016-04-27 ENCOUNTER — Ambulatory Visit (INDEPENDENT_AMBULATORY_CARE_PROVIDER_SITE_OTHER): Payer: BLUE CROSS/BLUE SHIELD | Admitting: Internal Medicine

## 2016-04-27 VITALS — BP 120/80 | HR 79 | Temp 98.0°F | Ht 66.0 in | Wt 149.0 lb

## 2016-04-27 DIAGNOSIS — F410 Panic disorder [episodic paroxysmal anxiety] without agoraphobia: Secondary | ICD-10-CM

## 2016-04-27 DIAGNOSIS — Z79899 Other long term (current) drug therapy: Secondary | ICD-10-CM | POA: Diagnosis not present

## 2016-04-27 DIAGNOSIS — E876 Hypokalemia: Secondary | ICD-10-CM

## 2016-04-27 LAB — BASIC METABOLIC PANEL
BUN: 9 mg/dL (ref 6–23)
CO2: 30 mEq/L (ref 19–32)
Calcium: 9.7 mg/dL (ref 8.4–10.5)
Chloride: 98 mEq/L (ref 96–112)
Creatinine, Ser: 1.04 mg/dL (ref 0.40–1.20)
GFR: 59.96 mL/min — ABNORMAL LOW (ref >60.00)
Glucose, Bld: 87 mg/dL (ref 70–99)
Potassium: 4.7 mEq/L (ref 3.5–5.1)
Sodium: 133 mEq/L — ABNORMAL LOW (ref 135–145)

## 2016-04-27 LAB — MAGNESIUM: Magnesium: 1.9 mg/dL (ref 1.5–2.5)

## 2016-04-27 NOTE — Telephone Encounter (Signed)
Patient called and scheduled an appointment for "perimenopausal symptoms and anxiety" with Kem Boroughs, FNP on Monday, 04/30/16.  Patient declined a call back from the nurse.  Routing to triage for FYI only.

## 2016-04-27 NOTE — Telephone Encounter (Signed)
Routing to Kem Boroughs, FNP. Will close encounter.

## 2016-04-27 NOTE — Patient Instructions (Signed)
Make appt with [facility]? If hormonal  Therapy may be helpful   We will contact   [facility]  .   About  Getting you in for earlier evaluation and what to do for you panic recurrence .   ? Increase celexa  Should be from them.

## 2016-04-30 ENCOUNTER — Ambulatory Visit (INDEPENDENT_AMBULATORY_CARE_PROVIDER_SITE_OTHER): Payer: BLUE CROSS/BLUE SHIELD | Admitting: Nurse Practitioner

## 2016-04-30 ENCOUNTER — Encounter: Payer: Self-pay | Admitting: Nurse Practitioner

## 2016-04-30 VITALS — BP 100/66 | HR 84 | Ht 66.0 in | Wt 149.0 lb

## 2016-04-30 DIAGNOSIS — N912 Amenorrhea, unspecified: Secondary | ICD-10-CM

## 2016-04-30 MED ORDER — MEDROXYPROGESTERONE ACETATE 10 MG PO TABS: 10.0000 mg | ORAL_TABLET | Freq: Every day | ORAL | 0 refills | Status: DC

## 2016-04-30 NOTE — Patient Instructions (Signed)
Menopause Menopause is the normal time of life when menstrual periods stop completely. Menopause is complete when you have missed 12 consecutive menstrual periods. It usually occurs between the ages of 48 years and 55 years. Very rarely does a woman develop menopause before the age of 40 years. At menopause, your ovaries stop producing the female hormones estrogen and progesterone. This can cause undesirable symptoms and also affect your health. Sometimes the symptoms may occur 4-5 years before the menopause begins. There is no relationship between menopause and:  Oral contraceptives.  Number of children you had.  Race.  The age your menstrual periods started (menarche).  Heavy smokers and very thin women may develop menopause earlier in life. What are the causes?  The ovaries stop producing the female hormones estrogen and progesterone. Other causes include:  Surgery to remove both ovaries.  The ovaries stop functioning for no known reason.  Tumors of the pituitary gland in the brain.  Medical disease that affects the ovaries and hormone production.  Radiation treatment to the abdomen or pelvis.  Chemotherapy that affects the ovaries.  What are the signs or symptoms?  Hot flashes.  Night sweats.  Decrease in sex drive.  Vaginal dryness and thinning of the vagina causing painful intercourse.  Dryness of the skin and developing wrinkles.  Headaches.  Tiredness.  Irritability.  Memory problems.  Weight gain.  Bladder infections.  Hair growth of the face and chest.  Infertility. More serious symptoms include:  Loss of bone (osteoporosis) causing breaks (fractures).  Depression.  Hardening and narrowing of the arteries (atherosclerosis) causing heart attacks and strokes.  How is this diagnosed?  When the menstrual periods have stopped for 12 straight months.  Physical exam.  Hormone studies of the blood. How is this treated? There are many treatment  choices and nearly as many questions about them. The decisions to treat or not to treat menopausal changes is an individual choice made with your health care provider. Your health care provider can discuss the treatments with you. Together, you can decide which treatment will work best for you. Your treatment choices may include:  Hormone therapy (estrogen and progesterone).  Non-hormonal medicines.  Treating the individual symptoms with medicine (for example antidepressants for depression).  Herbal medicines that may help specific symptoms.  Counseling by a psychiatrist or psychologist.  Group therapy.  Lifestyle changes including: ? Eating healthy. ? Regular exercise. ? Limiting caffeine and alcohol. ? Stress management and meditation.  No treatment.  Follow these instructions at home:  Take the medicine your health care provider gives you as directed.  Get plenty of sleep and rest.  Exercise regularly.  Eat a diet that contains calcium (good for the bones) and soy products (acts like estrogen hormone).  Avoid alcoholic beverages.  Do not smoke.  If you have hot flashes, dress in layers.  Take supplements, calcium, and vitamin D to strengthen bones.  You can use over-the-counter lubricants or moisturizers for vaginal dryness.  Group therapy is sometimes very helpful.  Acupuncture may be helpful in some cases. Contact a health care provider if:  You are not sure you are in menopause.  You are having menopausal symptoms and need advice and treatment.  You are still having menstrual periods after age 55 years.  You have pain with intercourse.  Menopause is complete (no menstrual period for 12 months) and you develop vaginal bleeding.  You need a referral to a specialist (gynecologist, psychiatrist, or psychologist) for treatment. Get help right   away if:  You have severe depression.  You have excessive vaginal bleeding.  You fell and think you have a  broken bone.  You have pain when you urinate.  You develop leg or chest pain.  You have a fast pounding heart beat (palpitations).  You have severe headaches.  You develop vision problems.  You feel a lump in your breast.  You have abdominal pain or severe indigestion. This information is not intended to replace advice given to you by your health care provider. Make sure you discuss any questions you have with your health care provider. Document Released: 04/28/2003 Document Revised: 07/14/2015 Document Reviewed: 09/04/2012 Elsevier Interactive Patient Education  2017 Elsevier Inc.  

## 2016-04-30 NOTE — Progress Notes (Signed)
Encounter reviewed by Dr. Brook Amundson C. Silva.  

## 2016-04-30 NOTE — Progress Notes (Signed)
51 y.o.  [id number] Married Caucasian female here to discuss amenorrhea.  LMP: Patient's last menstrual period was 03/07/2016 (exact date).    Menses are still irregular at 14 - 60 days. Flow for 4-5 days, moderate to light.  Some cramps. Hot flashes during the day that are tolerable.  Some insomnia.  Increase in panic attacks with 2 in one week.  Interferes with work. No increase in stress but father is ill.  Father has dementia and Alzheimer's.     Past Medical History:  Diagnosis Date  . ALLERGIC RHINITIS 11/07/2007  . Asthma as child  . BENIGN NEOPLASM OF SKIN SITE UNSPECIFIED 07/25/2009  . DEPRESSION 11/07/2007  . GERD 11/07/2007   LPR - laryngopharyngeal reflux  . Oral aphthae 11/07/2007    Heart disease:  No. DVT history:  No. Breast Cancer:  no Last Mammogram: 06/06/15 Bi rads:1 Last AEX:  11/01/15 Last Pap smear:  10/22/14 - normal with negative HR HPV  The patient has a family history of:  Paternal aunt with breast cancer.  Several family members with stroke  Discussed with patient risks and benefits and specifically the WHI study including but not limited to risks of increased risks of heart disease, MI, stroke, DVT, and breast cancer.  Increased risks of gall bladder disease and change in cholesterol panels also discussed.  Possibility of bleeding was discussed as patient does have a uterus.  Benefits of improved quality of life, improved bone density and decreased risks of colon cancer also discussed.    Recent lab work:  [facility]: not done        TSH: 11/01/15 - normal           Assessment:  Symptomatic perimenopausal symptoms that are tolerable   Increased  anxiety and panic attacks - thought to be hormonal   Current amenorrhea since 03/07/16  Plan:   Pt will start on Provera 10 mg X 5 days.  She will report heavy or prolonged bleeding.  She will report no bleeding.  UPT: today could not be done -pt unable to void - will do home UPT.  20 minutes visit 15 minutes face to face.

## 2016-05-09 IMAGING — MG MM DIGITAL SCREENING BILAT W/ CAD
4 series · 4 of 4 positions shown · non-contrast
Comparison: Previous exam(s).

CLINICAL DATA: Screening.

EXAM:
DIGITAL SCREENING BILATERAL MAMMOGRAM WITH CAD

[R CC]
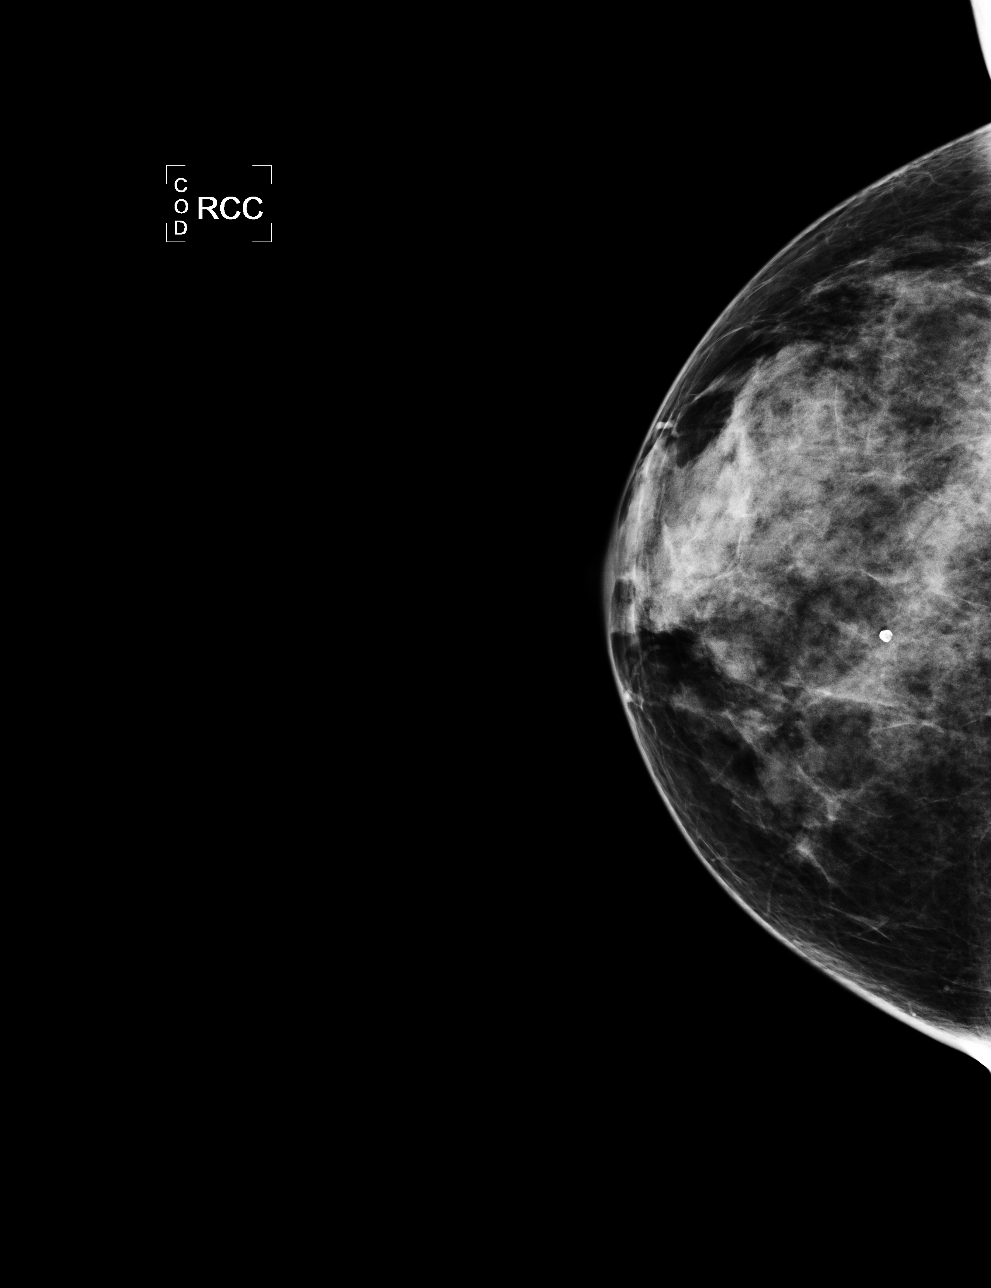

[L CC]
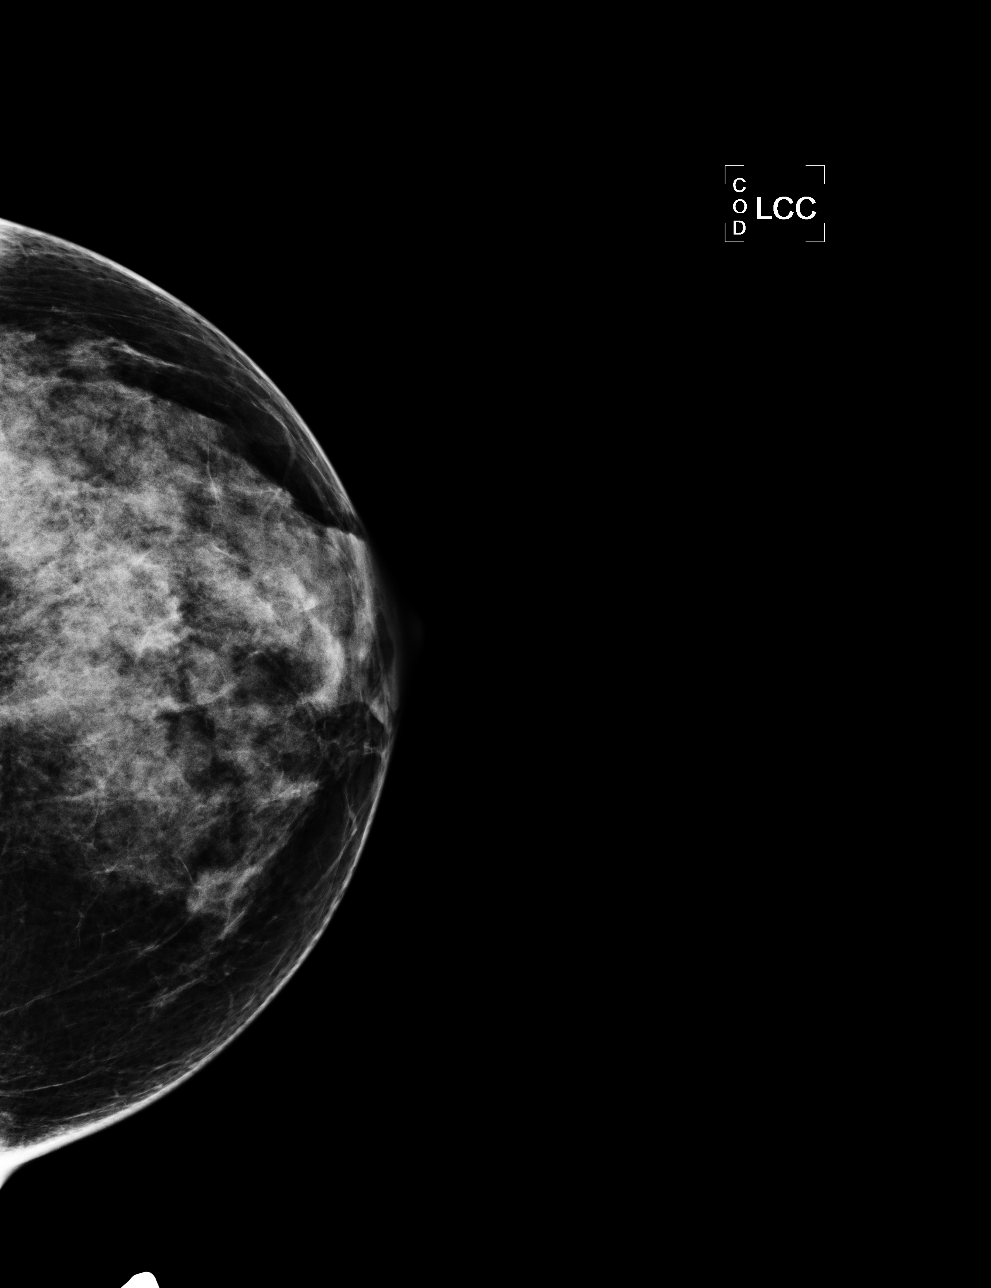

[L MLO]
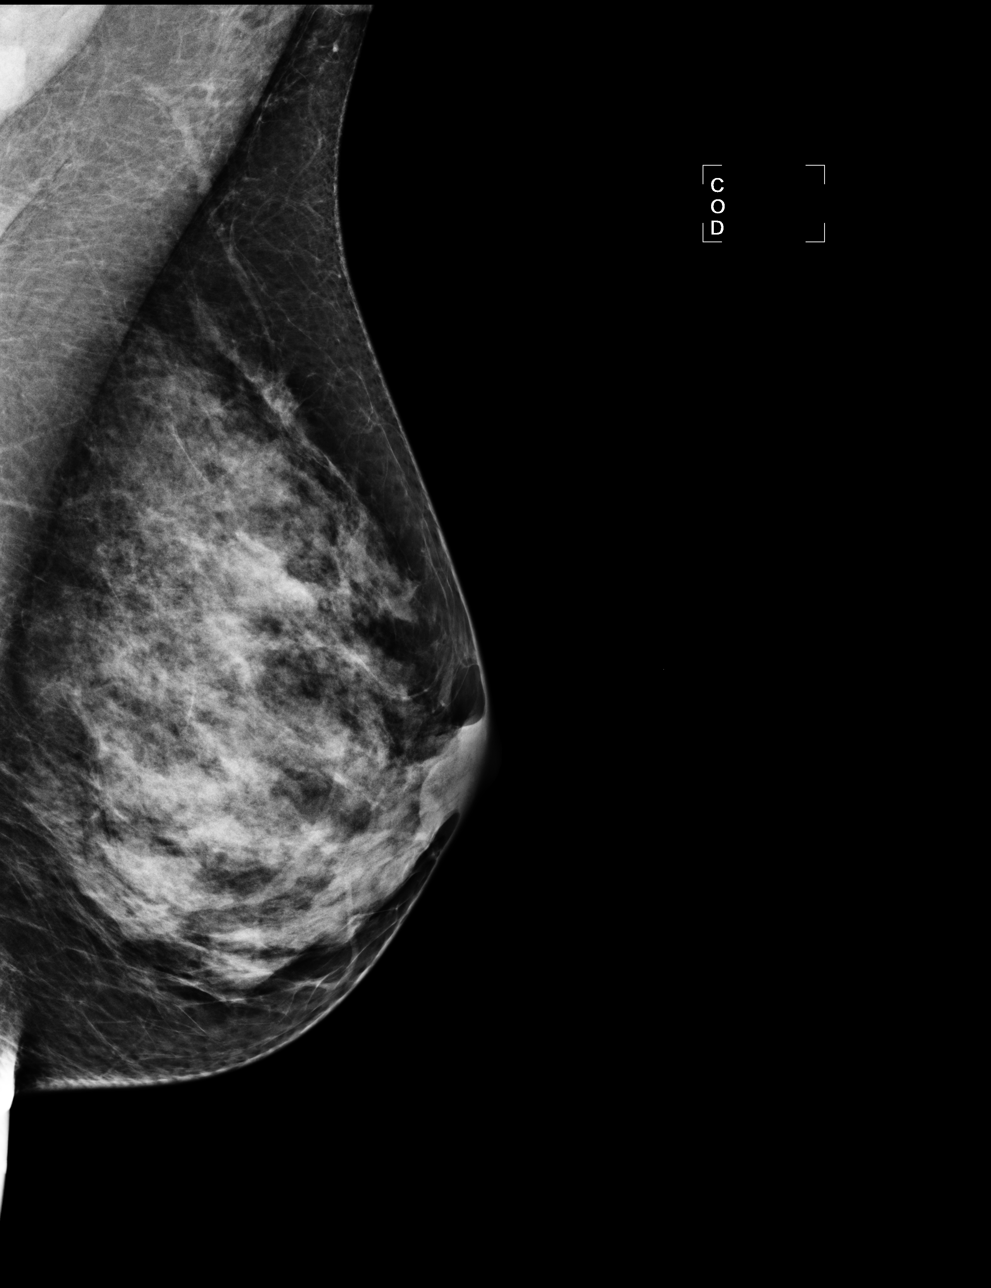

[R MLO]
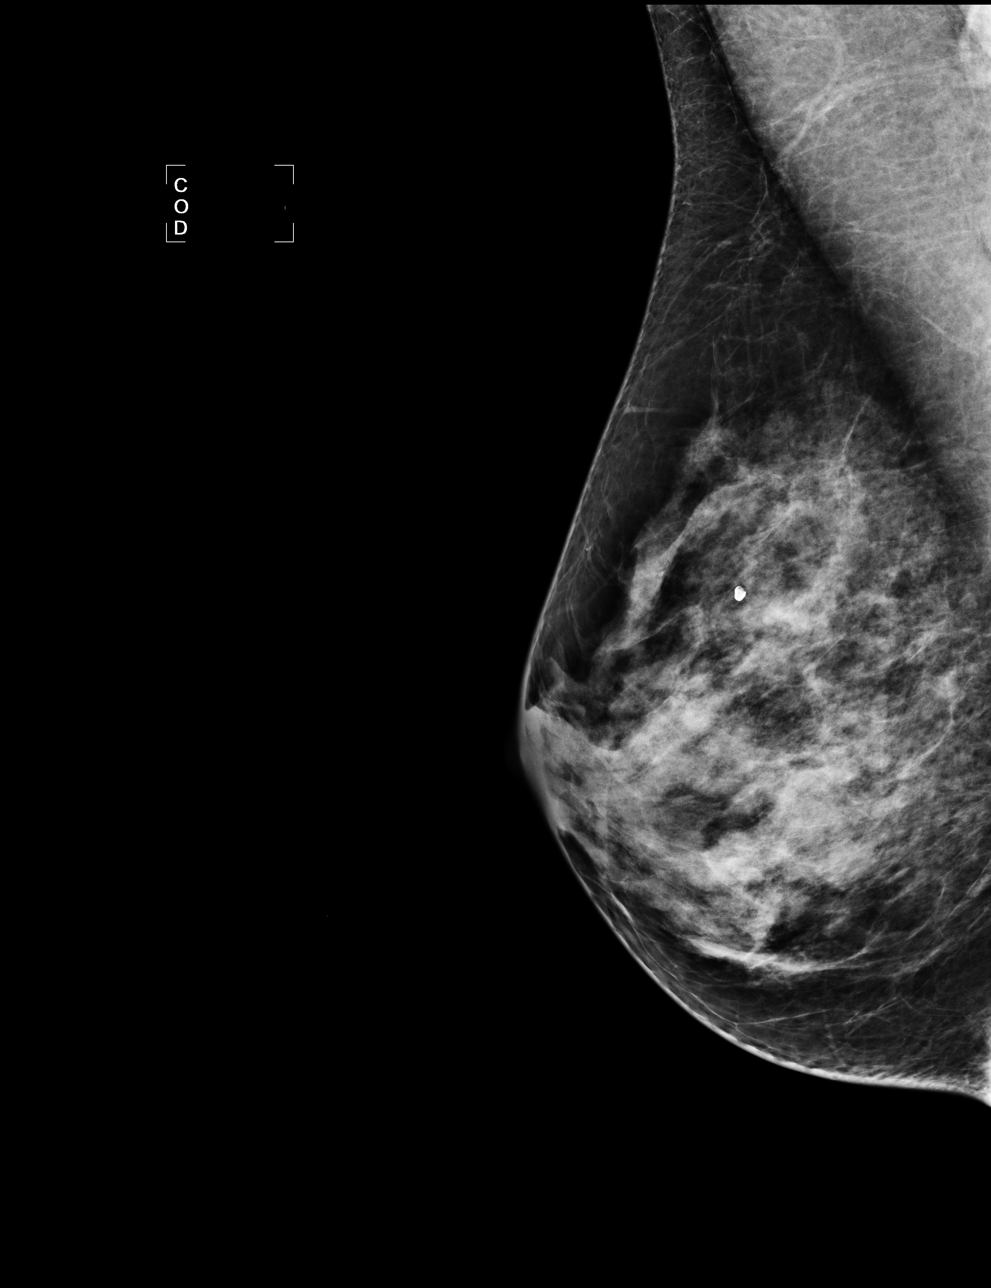

[4 of 4 positions shown; findings below may reference images not displayed]

ACR Breast Density Category c: The breast tissue is heterogeneously
dense, which may obscure small masses.
FINDINGS: There has been no significant interval change and there are no
findings suspicious for malignancy. Images were processed with CAD.
IMPRESSION: No mammographic evidence of malignancy. A result letter of this
screening mammogram will be mailed directly to the patient.

RECOMMENDATION:
Screening mammogram in one year. (Code:CK-A-TS5)

BI-RADS CATEGORY  1: Negative.

## 2016-05-11 ENCOUNTER — Telehealth: Payer: Self-pay | Admitting: Nurse Practitioner

## 2016-05-11 NOTE — Telephone Encounter (Signed)
Pt was given Provera Challenge on 3/12 for 5 days.  Her PCP has sent [place] a message that her panic attacks may be worsened by her perimenopausal status.  We need to see if she had a cycle and how she is doing.  If no bleeding then needs [facility] and will need to see if she needs HRT. Get a progress report.  She still has another week if still no bleeding.

## 2016-05-11 NOTE — Telephone Encounter (Signed)
Spoke with patient. Patient states that she has not started Provera challenge. Reports she is due to start her menses in the next couple of days and wants to wait to see if it starts on its own before starting Provera. Aware if her menses does not start she will need to take Provera 10 mg x 5 days. Aware if she starts her menses on her own will need to contact the office to let Kem Boroughs, FNP know. If not will need to let the office know that she is going to be taking Provera and report back with or without bleeding.

## 2016-05-16 ENCOUNTER — Telehealth: Payer: Self-pay | Admitting: Nurse Practitioner

## 2016-05-16 NOTE — Telephone Encounter (Signed)
Patient called to give an update to the nurse. She said, "I've taken all the Provera and still haven't started bleeding. I took the last one last night and was told to call with an update after that."

## 2016-05-16 NOTE — Telephone Encounter (Signed)
Not now but will wait and se outcome of Provera challenge.

## 2016-05-16 NOTE — Telephone Encounter (Signed)
Spoke with patient. Patient states she completed last dose of 5 day Provera last night and still has no bleeding. Advised patient to continue to monitor for 2 weeks from today and call with update if bleeding or no bleeding by end of 2 weeks. Advised patient would update Kem Boroughs, NP and return call if she has any additional recommendations. Patient verbalizes understanding and is agreeable.   Kem Boroughs, NP -any additional recommendations?

## 2016-05-29 ENCOUNTER — Telehealth: Payer: Self-pay | Admitting: Nurse Practitioner

## 2016-05-29 ENCOUNTER — Other Ambulatory Visit: Payer: Self-pay | Admitting: Nurse Practitioner

## 2016-05-29 DIAGNOSIS — N912 Amenorrhea, unspecified: Secondary | ICD-10-CM

## 2016-05-29 NOTE — Telephone Encounter (Addendum)
[employer] @ [facility].  Patient said "I was seen about two weeks ago and would Edman Circle, FNP call in something for a bacterial infection?" Patient declined any appointments today. I told patient that we normally do not treat over the phone. Patient asked if Edman Circle would call in something due to her just being seen?

## 2016-05-29 NOTE — Telephone Encounter (Signed)
Spoke with patient. Patient states that she took her last dose of Provera on 05/15/2016 and has not had any bleeding. Advised she will need to be seen in the office to check hormone levels with lab work. Patient is agreeable. Lab appointment scheduled for 06/05/2016 at 8:30 am. Patient is agreeable to date and time.  Routing to Kem Boroughs, FNP for lab orders.

## 2016-05-29 NOTE — Telephone Encounter (Signed)
Pt orders for FSH, TSH, Prolactin is put in Epic.

## 2016-05-29 NOTE — Telephone Encounter (Signed)
Patient called to report she did not start her menstrual cycle after taking Provera.

## 2016-05-29 NOTE — Telephone Encounter (Signed)
Spoke with patient. Patient states she has been experiencing vaginal itching and burning for thje last 2-3 days. Denies discharge, odor, pain or urinary complaints. Patient states no intercourse in last 5 years. Patient states for the last 3 years in spring she has developed vaginosis. Patient states she has tried vagisil with no relief. Patient states unable to schedule OV d/t difficult work schedule and was just in 2 weeks ago and returning for labs next week. Patient would like Kem Boroughs, NP recommendations. Attempted to move patients lab appointment to combine into OV, patient declined. Advised patient would review with provider and return call. patient is agreeable.  Kem Boroughs, NP -please advise?

## 2016-05-31 NOTE — Telephone Encounter (Signed)
Patient should use Aveeno sitz bath for discomfort and can obtain Aveeno Eczema cream for external use per OTC instructions which should provide relief. Loose or no underwear at hs also may help.p Increase water intake.. If not resolving will need OV.

## 2016-05-31 NOTE — Telephone Encounter (Signed)
Spoke with patient, advised as seen below per Melvia Heaps, CNM. Patient states she would like to schedule OV with Kem Boroughs, NP. Patient scheduled for OV on 06/04/16 at 4pm, lab appt canceled on 4/17, will have [place] drawn at [place]. Patient verbalizes understanding and is agreeable.  Routing to provider for final review. Patient is agreeable to disposition. Will close encounter.   Cc: Kem Boroughs, NP

## 2016-05-31 NOTE — Telephone Encounter (Signed)
Left message to call Rosaura Bolon at [phone].  

## 2016-06-04 ENCOUNTER — Encounter: Payer: Self-pay | Admitting: Nurse Practitioner

## 2016-06-04 ENCOUNTER — Ambulatory Visit (INDEPENDENT_AMBULATORY_CARE_PROVIDER_SITE_OTHER): Payer: BLUE CROSS/BLUE SHIELD | Admitting: Nurse Practitioner

## 2016-06-04 VITALS — BP 122/84 | HR 60 | Ht 66.0 in | Wt 148.0 lb

## 2016-06-04 DIAGNOSIS — N912 Amenorrhea, unspecified: Secondary | ICD-10-CM

## 2016-06-04 DIAGNOSIS — N760 Acute vaginitis: Secondary | ICD-10-CM

## 2016-06-04 MED ORDER — FLUCONAZOLE 150 MG PO TABS: 150.0000 mg | ORAL_TABLET | Freq: Once | ORAL | 0 refills | Status: AC

## 2016-06-04 NOTE — Patient Instructions (Signed)
Will call or send test results

## 2016-06-04 NOTE — Progress Notes (Signed)
Patient ID: Lisa Perkins, female   DOB: [birth year], 51 y.o.   MRN: [id number]  51 y.o. Married Caucasian female [id number] here with complaint of vaginal symptoms of itching, burning, and increase discharge. Describes discharge as white and itching.  Symptoms for her usually occur in the spring and treated with Diflucan with resolution.  Onset of symptoms 7 days ago. Denies new personal products or vaginal dryness.   No STD concerns. Urinary symptoms none . Contraception is vasectomy for husband.  Took Provera challenge and completed med's on 05/16/15 no bleeding.  On Lamictal for yrs but recently increased dose to 150 mg daily which may also be the cause amenorrhea.  Apt at [facility]rist is on Wednesday   O:  Healthy female WDWN Affect: normal, orientation x 3  Exam:no distress Abdomen: soft and non tender Lymph node: no enlargement or tenderness Pelvic exam: External genital: normal female BUS: negative Vagina: white thin discharge noted.  Affirm taken.   A: Vaginitis  No withdrawal from Provera challenge - R/O menopause  History of manic depression with recent increase in medication   P: Discussed findings of vaginitis and etiology. Discussed Aveeno or baking soda sitz bath for comfort. Avoid moist clothes or pads for extended period of time. If working out in gym clothes or swim suits for long periods of time change underwear or bottoms of swimsuit if possible. Olive Oil/Coconut Oil use for skin protection prior to activity can be used to external skin.  Rx: Diflucan X 2  Follow with Affirm along with TSH, Prolactin, FSH  RV prn

## 2016-06-05 ENCOUNTER — Other Ambulatory Visit: Payer: BLUE CROSS/BLUE SHIELD

## 2016-06-05 ENCOUNTER — Telehealth: Payer: Self-pay | Admitting: *Deleted

## 2016-06-05 ENCOUNTER — Telehealth: Payer: Self-pay

## 2016-06-05 LAB — TSH: TSH: 2.43 mIU/L

## 2016-06-05 LAB — PROLACTIN: Prolactin: 12.6 ng/mL

## 2016-06-05 LAB — WET PREP BY MOLECULAR PROBE
Candida species: NOT DETECTED
Gardnerella vaginalis: DETECTED — AB
Trichomonas vaginosis: NOT DETECTED

## 2016-06-05 LAB — FOLLICLE STIMULATING HORMONE: FSH: 97.6 m[IU]/mL

## 2016-06-05 MED ORDER — METRONIDAZOLE 0.75 % VA GEL: 1.0000 | Freq: Every day | VAGINAL | 0 refills | Status: DC

## 2016-06-05 NOTE — Telephone Encounter (Signed)
-----   Message from Kem Boroughs, [place] sent at 06/05/2016  8:30 AM EDT ----- Please let pt know that Affirm was positive for BV and not yeast.  She will need Metrogel HS X 5 and then take the last Diflucan after she has finished the Metrogel.    The prolactin and TSH test is normal.  The FSH is elevated at 97.6.   Amenorrhea and no withdrawal from Provera means she is in menopause.  We have discussed briefly at past 2 OV about HRT.  She has [place] of CVA, HTN, and breast cancer and does not want to go on HRT.  She is trying OTC remedies to help with vaso symptoms. She did have a recent increase in Lamictal and she feels this is helping her emotional symptoms.

## 2016-06-05 NOTE — Telephone Encounter (Signed)
Spoke with Wannetta Sender at [facility]. Was advised provider must be made aware of interactions of Celexa and Diflucan, will fax notification to [phone]. RN advised per conversation with patient and Kem Boroughs, NP -ok to fill diflucan, patient is aware to take Celexa 12 hours apart, has taken in past. Per Wannetta Sender, will fill RX for diflucan.   Kem Boroughs, NP -any additional recommendations? Ok to close encounter?

## 2016-06-05 NOTE — Telephone Encounter (Signed)
OK to close encounter. 

## 2016-06-05 NOTE — Telephone Encounter (Signed)
Left message for patient to call Lisa Perkins back.  

## 2016-06-05 NOTE — Telephone Encounter (Signed)
Left message to call Jerilyn Gillaspie at [phone].  

## 2016-06-05 NOTE — Telephone Encounter (Signed)
Spoke with patient and advised of lab results. Prescription called to pharmacy on file. Patient verbalized understanding and agreement.

## 2016-06-05 NOTE — Telephone Encounter (Signed)
-----   Message from Kem Boroughs, [place] sent at 06/05/2016 12:59 PM EDT ----- The co administration of Celexa and Diflucan can increase plasma concentrations of the Celexa.  This can lead to changes on EKG with QT prolongation and CNS toxicity of serotonin syndrome.  These potential risk are often not seen with taking Celexa in doses at 20 mg or less.  They can also be taken 12 hours apart.  We can offer Terazol vaginal cream if she gets a yeast infection after the Metrogel.  She may also check with her psychiatrist about dosing of Celexa and Diflucan at 12 hour apart or skipping dose of Celexa X 1 day of Diflucan.

## 2016-06-05 NOTE — Telephone Encounter (Signed)
Spoke with patient, advised as seen below per Kem Boroughs, NP. Patient states pharmacy told her they had to  to call and verify with Kem Boroughs, NP ok to dispense diflucan or would send a fax. Patient states she has taken diflucan in past, plans to take 12 hours apart. Advised patient RN would review with Kem Boroughs, NP and return call with any additional recommendations. Patient states she has not picked up Metrogel, plans to pick up today. Patient verbalizes understanding and is agreeable.  Kem Boroughs, NP -no faxes received from pharmacy.   Call to [place], closed for lunch, reopens at 2:30pm.

## 2016-06-07 ENCOUNTER — Telehealth: Payer: Self-pay | Admitting: Nurse Practitioner

## 2016-06-07 MED ORDER — METRONIDAZOLE 500 MG PO TABS: 500.0000 mg | ORAL_TABLET | Freq: Two times a day (BID) | ORAL | 0 refills | Status: DC

## 2016-06-07 NOTE — Progress Notes (Signed)
Encounter reviewed by Dr. Claudia Greenley Amundson C. Silva.  

## 2016-06-07 NOTE — Telephone Encounter (Signed)
Spoke with patient. Patient was seen on 06/04/2016 and was started on Metrogel for BV. Patient states that she has used Metrogel for 2 nights and is having moderate vaginal burning. Patient does not want to continue using Metrogel and is requesting to switch to taking oral Flagyl. Rx for Flagyl 500 mg BID x 7 days #14 0RF sent to pharmacy on file. Avoid alcohol during treatment and 24 hours after completing medication. Don't mix with alcohol if mixed can cause severe nausea, vomiting and abdominal cramping. Normal side effects of Flagyl may include abdominal cramping, diarrhea and dizziness. Patient verbalizes understanding.  Routing to provider for final review. Patient agreeable to disposition. Will close encounter.

## 2016-06-07 NOTE — Telephone Encounter (Signed)
Patient is having burning with the metronidazole cream and would like to speak with nurse about getting an oral antibiotic.

## 2016-06-12 ENCOUNTER — Other Ambulatory Visit: Payer: Self-pay | Admitting: Nurse Practitioner

## 2016-06-12 DIAGNOSIS — Z1231 Encounter for screening mammogram for malignant neoplasm of breast: Secondary | ICD-10-CM

## 2016-06-14 ENCOUNTER — Telehealth: Payer: Self-pay | Admitting: Nurse Practitioner

## 2016-06-14 NOTE — Telephone Encounter (Signed)
Would not retreat with Diflucan because wet prep was positive for BV and she did not complete medication for . She needs OV to determine if BV is still present or yeast. She did treat for yeast.

## 2016-06-14 NOTE — Telephone Encounter (Signed)
Patient returning your call.

## 2016-06-14 NOTE — Telephone Encounter (Signed)
Patient states she still has a yeast infection. She states she was told to call in to have some more medication called into her pharmacy. She is using [facility] [address].

## 2016-06-14 NOTE — Telephone Encounter (Signed)
Left message to call Alera Quevedo at [phone].  

## 2016-06-14 NOTE — Telephone Encounter (Signed)
Spoke with patient, advised as seen below per Melvia Heaps, CNM. Patient declined to schedule OV at this time, was just in for 2 recent visits, will have to look at schedule and return call. Patient verbalizes understanding.  Routing to provider for final review. Patient is agreeable to disposition. Will close encounter.

## 2016-06-14 NOTE — Telephone Encounter (Signed)
Spoke with patient. Patient states she was in for OV on 4/16, affirm positive for BV, neg for yeast. Patient states she did not take prescribed flagyl for BV d/t side effect of dizziness, already experiences problems with dizziness, did not want to take and drive. Patient states she completed diflucan and is still experiencing vaginal itching, white cottage cheese discharge and burning. Patient is requesting additional medication for yeast to be sent in for yeast. Advised patient that Kem Boroughs, NP is out of the office today, will review with covering provider for recommendations and return call, patient is agreeable.  Lisa Perkins, CNM -please review and advise?  Cc: Kem Boroughs, NP

## 2016-06-15 ENCOUNTER — Ambulatory Visit (INDEPENDENT_AMBULATORY_CARE_PROVIDER_SITE_OTHER): Payer: BLUE CROSS/BLUE SHIELD | Admitting: Family Medicine

## 2016-06-15 ENCOUNTER — Encounter: Payer: Self-pay | Admitting: Family Medicine

## 2016-06-15 VITALS — BP 120/70 | HR 90 | Resp 12 | Ht 66.0 in | Wt 149.2 lb

## 2016-06-15 DIAGNOSIS — N760 Acute vaginitis: Secondary | ICD-10-CM | POA: Diagnosis not present

## 2016-06-15 DIAGNOSIS — L298 Other pruritus: Secondary | ICD-10-CM

## 2016-06-15 DIAGNOSIS — N898 Other specified noninflammatory disorders of vagina: Secondary | ICD-10-CM

## 2016-06-15 MED ORDER — TERCONAZOLE 0.4 % VA CREA: 1.0000 | TOPICAL_CREAM | Freq: Every day | VAGINAL | 0 refills | Status: AC

## 2016-06-15 MED ORDER — FLUCONAZOLE 150 MG PO TABS: 150.0000 mg | ORAL_TABLET | ORAL | 0 refills | Status: AC

## 2016-06-15 NOTE — Progress Notes (Signed)
Pre visit review using our clinic review tool, if applicable. No additional management support is needed unless otherwise documented below in the visit note. 

## 2016-06-15 NOTE — Progress Notes (Signed)
HPI:   ACUTE VISIT:  Chief Complaint  Patient presents with  . itching and burning    Lisa Perkins is a 51 y.o. female, who is here today complaining of persistent and constant vaginal pruritus and burning sensation. She was already evaluated by her gyn about 2 weeks ago and treatment recommendations were given.  According to pt, she has vaginal itching every Spring for the past few years. States that she always assumed it was yeast but this time she decided to go see her gyn. Per pt report, after examination and testing it was determined she had BV, so Metronidazole gel stared. Day 2 of Metronidazole gel she developed whitish thick "cottage cheese" like vaginal discharge,burning sensation and pruritus got worse.  Treated with Diflucan tabs x 2 ,vaginal discharge improved but still having burning and itching.   She stopped Metronidazole gel and called her gyn, oral Metronidazole was called in. She has not started medications, she read side effects and afraid of taking med. She tells me that she is already taking mediation that cause some side effects.  She would like to know if there is another treatment for BV with no side effects.   Denies abdominal pain, nausea, vomiting, changes in bowel habits,urinary symptoms, vaginal spotting, or skin rash.   LMP 03/2016, perimenopausal. States that her husband has Peyronie's disease and she has not been sexually sexually active for about 4 years.    Review of Systems  Constitutional: Negative for activity change, appetite change, fatigue and fever.  Gastrointestinal: Negative for abdominal pain, nausea and vomiting.  Endocrine: Negative for polydipsia, polyphagia and polyuria.  Genitourinary: Positive for vaginal discharge. Negative for decreased urine volume, dysuria, frequency, hematuria and vaginal bleeding.  Musculoskeletal: Negative for back pain and myalgias.  Skin: Negative for rash and wound.    Psychiatric/Behavioral: Negative for confusion. The patient is nervous/anxious.      Current Outpatient Prescriptions on File Prior to Visit  Medication Sig Dispense Refill  . albuterol (PROVENTIL HFA;VENTOLIN HFA) 108 (90 BASE) MCG/ACT inhaler Inhale 2 puffs into the lungs every 6 (six) hours as needed for wheezing or shortness of breath. 1 Inhaler 2  . citalopram (CELEXA) 20 MG tablet Take 1.5 tablets by mouth daily.    . clonazePAM (KLONOPIN) 0.5 MG tablet Take 0.5 mg by mouth at bedtime as needed.     Marland Kitchen ibuprofen (ADVIL,MOTRIN) 200 MG tablet Take 200 mg by mouth every 6 (six) hours as needed.    . lamoTRIgine (LAMICTAL) 100 MG tablet Take 1 tablet by mouth daily.    . lansoprazole (PREVACID) 30 MG capsule      No current facility-administered medications on file prior to visit.      Past Medical History:  Diagnosis Date  . ALLERGIC RHINITIS 11/07/2007  . Asthma as child  . BENIGN NEOPLASM OF SKIN SITE UNSPECIFIED 07/25/2009  . DEPRESSION 11/07/2007  . GERD 11/07/2007   LPR - laryngopharyngeal reflux  . Oral aphthae 11/07/2007   Allergies  Allergen Reactions  . Amoxicillin     REACTION: nausea  gi signs  . Erythromycin     Social History   Social History  . Marital status: Married    Spouse name: N/A  . Number of children: N/A  . Years of education: N/A   Social History Main Topics  . Smoking status: Never Smoker  . Smokeless tobacco: Never Used  . Alcohol use 0.0 oz/week     Comment: 1 drink per month  .  Drug use: No  . Sexual activity: Not Currently   Other Topics Concern  . 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    Non smoker     Vitals:   06/15/16 0804  BP: 120/70  Pulse: 90  Resp: 12   Body mass index is 24.09 kg/m.   Physical Exam  Nursing note and vitals reviewed. Constitutional: She is oriented to person, place, and time. She appears well-developed and well-nourished. No distress.  HENT:  Head:  Atraumatic.  Eyes: Conjunctivae and EOM are normal.  Respiratory: Effort normal. No respiratory distress.  GI: Soft. She exhibits no mass. There is no hepatomegaly. There is no tenderness.  Musculoskeletal: She exhibits no edema or tenderness.  Neurological: She is alert and oriented to person, place, and time. Gait normal.  Skin: Skin is warm. No rash noted. No erythema.  Psychiatric: Her speech is normal. Her mood appears anxious. Her affect is labile.    ASSESSMENT AND PLAN:   Karenann was seen today for itching and burning.  Diagnoses and all orders for this visit:  Vaginal pruritus  We discussed possible causes. She agrees to continue treating as fungal etiology. If symptoms are not resolved, she would like to have Metronidazole gel call in,so she can try topical treatment again.   Vaginitis and vulvovaginitis  Some side effects of medications discussed. General preventive recommendations given. F/U as needed.  -     fluconazole (DIFLUCAN) 150 MG tablet; Take 1 tablet (150 mg total) by mouth once a week. -     terconazole (TERAZOL 7) 0.4 % vaginal cream; Place 1 applicator vaginally at bedtime.   15 min face to face OV. > 50% was dedicated to counseling and reassuring. We discuss differential Dx, prognosis,treatment options, and some side effects of pharmacologic treatments.     -Lisa Perkins was advised to follow with her gyn if symptoms worsen or persist.       Betty G. Martinique, MD  [facility]. [place] office.

## 2016-06-15 NOTE — Patient Instructions (Signed)
  Lisa Perkins I have seen you today for an acute visit.  A few things to remember from today's visit:   Vaginal pruritus  Vaginitis and vulvovaginitis - Plan: fluconazole (DIFLUCAN) 150 MG tablet, terconazole (TERAZOL 7) 0.4 % vaginal cream   Medications prescribed today are intended for short period of time and will not be refill upon request, a follow up appointment might be necessary to discuss continuation of of treatment if appropriate.  As we agreed today I will send Metronidazole gel to pharmacy if still itching after other symptoms resolved.    In general please monitor for signs of worsening symptoms and seek immediate medical attention if any concerning.

## 2016-06-26 ENCOUNTER — Encounter: Payer: Self-pay | Admitting: Nurse Practitioner

## 2016-06-28 ENCOUNTER — Other Ambulatory Visit: Payer: Self-pay | Admitting: Internal Medicine

## 2016-07-02 NOTE — Telephone Encounter (Signed)
Okay to refill? 

## 2016-07-02 NOTE — Telephone Encounter (Signed)
okl to refill

## 2016-09-04 ENCOUNTER — Telehealth: Payer: Self-pay | Admitting: Certified Nurse Midwife

## 2016-09-04 NOTE — Telephone Encounter (Signed)
Left message regarding upcoming appointment has been canceled and needs to be rescheduled. °

## 2016-09-19 NOTE — Progress Notes (Deleted)
No chief complaint on file.   HPI: Lisa Perkins 51 y.o.  ROS: See pertinent positives and negatives per HPI.  Past Medical History:  Diagnosis Date  . ALLERGIC RHINITIS 11/07/2007  . Asthma as child  . BENIGN NEOPLASM OF SKIN SITE UNSPECIFIED 07/25/2009  . DEPRESSION 11/07/2007  . GERD 11/07/2007   LPR - laryngopharyngeal reflux  . 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 Endometriosis Mother   . Hypertension Mother   . Diabetes Maternal Grandmother   . Hypertension Maternal Grandmother   . Stroke Maternal Grandmother   . COPD Father   . Dementia Father   . Hypertension Maternal Grandfather   . Stroke Maternal Grandfather   . Stroke Paternal Grandmother   . Stroke Paternal Grandfather   . Diabetes Maternal Uncle   . Breast cancer Paternal Aunt 28       bilateral mastectomy - survivor  . Asthma Neg Hx        family hx    Social History   Social History  . Marital status: Married    Spouse name: N/A  . Number of children: N/A  . Years of education: N/A   Social History Main Topics  . Smoking status: Never Smoker  . Smokeless tobacco: Never Used  . Alcohol use 0.0 oz/week     Comment: 1 drink per month  . Drug use: No  . Sexual activity: Not Currently   Other Topics Concern  . 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    Non smoker     Outpatient Medications Prior to Visit  Medication Sig Dispense Refill  . albuterol (PROVENTIL HFA;VENTOLIN HFA) 108 (90 BASE) MCG/ACT inhaler Inhale 2 puffs into the lungs every 6 (six) hours as needed for wheezing or shortness of breath. 1 Inhaler 2  . citalopram (CELEXA) 20 MG tablet Take 1.5 tablets by mouth daily.    . clonazePAM (KLONOPIN) 0.5 MG tablet Take 0.5 mg by mouth at bedtime as needed.     Marland Kitchen ibuprofen (ADVIL,MOTRIN) 200 MG tablet Take 200 mg by mouth every 6 (six) hours as needed.    . lamoTRIgine (LAMICTAL) 100 MG tablet  Take 1 tablet by mouth daily.    . lansoprazole (PREVACID) 30 MG capsule     . olopatadine (PATANOL) 0.1 % ophthalmic solution PLACE 1 DROP INTO BOTH EYES 2 (TWO) TIMES DAILY. 5 mL 2   No facility-administered medications prior to visit.      EXAM:  There were no vitals taken for this visit.  There is no height or weight on file to calculate BMI.  GENERAL: vitals reviewed and listed above, alert, oriented, appears well hydrated and in no acute distress HEENT: atraumatic, conjunctiva  clear, no obvious abnormalities on inspection of external nose and ears OP : no lesion edema or exudate  NECK: no obvious masses on inspection palpation  LUNGS: clear to auscultation bilaterally, no wheezes, rales or rhonchi, good air movement CV: HRRR, no clubbing cyanosis or  peripheral edema nl cap refill  MS: moves all extremities without noticeable focal  abnormality PSYCH: pleasant and cooperative, no obvious depression or anxiety  ASSESSMENT AND PLAN:  Discussed the following assessment and plan:  No diagnosis found.  -Patient advised to return or notify health care team  if symptoms worsen ,persist or new concerns arise.  There are no Patient Instructions  on file for this visit.   Standley Brooking. Lorcan Shelp M.D.

## 2016-09-20 ENCOUNTER — Ambulatory Visit: Payer: Self-pay | Admitting: Internal Medicine

## 2016-09-20 DIAGNOSIS — Z0289 Encounter for other administrative examinations: Secondary | ICD-10-CM

## 2016-11-06 ENCOUNTER — Ambulatory Visit: Payer: BLUE CROSS/BLUE SHIELD | Admitting: Nurse Practitioner

## 2016-11-09 ENCOUNTER — Encounter: Payer: Self-pay | Admitting: Internal Medicine

## 2017-01-29 ENCOUNTER — Telehealth: Payer: Self-pay | Admitting: Certified Nurse Midwife

## 2017-01-29 NOTE — Telephone Encounter (Signed)
Left message on voicemail to call and reschedule cancelled appointment. °

## 2017-01-30 ENCOUNTER — Ambulatory Visit: Payer: BLUE CROSS/BLUE SHIELD | Admitting: Certified Nurse Midwife

## 2017-01-31 ENCOUNTER — Ambulatory Visit (INDEPENDENT_AMBULATORY_CARE_PROVIDER_SITE_OTHER): Payer: BLUE CROSS/BLUE SHIELD | Admitting: Certified Nurse Midwife

## 2017-01-31 ENCOUNTER — Encounter: Payer: Self-pay | Admitting: Certified Nurse Midwife

## 2017-01-31 ENCOUNTER — Other Ambulatory Visit: Payer: Self-pay

## 2017-01-31 VITALS — BP 110/70 | HR 70 | Resp 16 | Ht 65.75 in | Wt 149.0 lb

## 2017-01-31 DIAGNOSIS — F32A Depression, unspecified: Secondary | ICD-10-CM | POA: Insufficient documentation

## 2017-01-31 DIAGNOSIS — Z862 Personal history of diseases of the blood and blood-forming organs and certain disorders involving the immune mechanism: Secondary | ICD-10-CM | POA: Diagnosis not present

## 2017-01-31 DIAGNOSIS — F329 Major depressive disorder, single episode, unspecified: Secondary | ICD-10-CM | POA: Insufficient documentation

## 2017-01-31 DIAGNOSIS — N938 Other specified abnormal uterine and vaginal bleeding: Secondary | ICD-10-CM | POA: Diagnosis not present

## 2017-01-31 DIAGNOSIS — N926 Irregular menstruation, unspecified: Secondary | ICD-10-CM | POA: Diagnosis not present

## 2017-01-31 DIAGNOSIS — Z0000 Encounter for general adult medical examination without abnormal findings: Secondary | ICD-10-CM | POA: Diagnosis not present

## 2017-01-31 DIAGNOSIS — Z01419 Encounter for gynecological examination (general) (routine) without abnormal findings: Secondary | ICD-10-CM | POA: Diagnosis not present

## 2017-01-31 DIAGNOSIS — F419 Anxiety disorder, unspecified: Secondary | ICD-10-CM | POA: Insufficient documentation

## 2017-01-31 DIAGNOSIS — F319 Bipolar disorder, unspecified: Secondary | ICD-10-CM | POA: Insufficient documentation

## 2017-01-31 NOTE — Progress Notes (Signed)
51 y.o. G0P0000 Married  Caucasian Fe here for annual exam. Periods changing with every 2 months for almost a year, but this last period has lasted 3 weeks with being very light,now.  Has felt like it is ending.No cramping. Sees Psychiatry for anxiety medication management every 3-4 months and Dr Regis Bill yearly for GERD management/aex and labs. Some weight change, but eats well. No health concerns today.  Patient's last menstrual period was 01/05/2017 (exact date).          Sexually active: No.  The current method of family planning is vasectomy.    Exercising: No.  exercise Smoker:  no  Health Maintenance: Pap:  10-22-14 neg HPV HR neg History of Abnormal Pap: yes MMG:  06-20-16 category c density birads 1:neg Self Breast exams: no Colonoscopy:  2016 f/u 74yrs BMD:   none TDaP:  2011 Shingles: no Pneumonia: no Hep C and HIV: no Labs: if needed   reports that  has never smoked. she has never used smokeless tobacco. She reports that she drinks alcohol. She reports that she does not use drugs.  Past Medical History:  Diagnosis Date  . ALLERGIC RHINITIS 11/07/2007  . Asthma as child  . BENIGN NEOPLASM OF SKIN SITE UNSPECIFIED 07/25/2009  . DEPRESSION 11/07/2007  . GERD 11/07/2007   LPR - laryngopharyngeal reflux  . 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 TONSILLECTOMY AND ADENOIDECTOMY    . WISDOM TOOTH EXTRACTION      Current Outpatient Medications  Medication Sig Dispense Refill  . albuterol (PROVENTIL HFA;VENTOLIN HFA) 108 (90 BASE) MCG/ACT inhaler Inhale 2 puffs into the lungs every 6 (six) hours as needed for wheezing or shortness of breath. 1 Inhaler 2  . citalopram (CELEXA) 20 MG tablet Take 1.5 tablets by mouth daily.    . clonazePAM (KLONOPIN) 0.5 MG tablet Take 0.5 mg by mouth at bedtime as needed.     Marland Kitchen ibuprofen (ADVIL,MOTRIN) 200 MG tablet Take 200 mg by mouth every 6 (six) hours as needed.    . lamoTRIgine (LAMICTAL) 100 MG tablet  Take 1 tablet by mouth daily.    . lansoprazole (PREVACID) 30 MG capsule     . olopatadine (PATANOL) 0.1 % ophthalmic solution PLACE 1 DROP INTO BOTH EYES 2 (TWO) TIMES DAILY. 5 mL 2   No current facility-administered medications for this visit.     Family History  Problem Relation Age of Onset  . Endometriosis Mother   . Hypertension Mother   . Diabetes Maternal Grandmother   . Hypertension Maternal Grandmother   . Stroke Maternal Grandmother   . COPD Father   . Dementia Father   . Hypertension Maternal Grandfather   . Stroke Maternal Grandfather   . Stroke Paternal Grandmother   . Stroke Paternal Grandfather   . Diabetes Maternal Uncle   . Breast cancer Paternal Aunt 30       bilateral mastectomy - survivor  . Asthma Neg Hx        family hx    ROS:  Pertinent items are noted in HPI.  Otherwise, a comprehensive ROS was negative.  Exam:   BP 110/70   Pulse 70   Resp 16   Ht 5' 5.75" (1.67 m)   Wt 149 lb (67.6 kg)   LMP 01/05/2017 (Exact Date) Comment: spotting for 3 weeks  BMI 24.23 kg/m  Height: 5' 5.75" (167 cm) Ht Readings from Last 3 Encounters:  01/31/17 5' 5.75" (1.67 m)  06/15/16 5\' 6"  (1.676 m)  06/04/16 5\' 6"  (1.676 m)    General appearance: alert, cooperative and appears stated age Head: Normocephalic, without obvious abnormality, atraumatic Neck: no adenopathy, supple, symmetrical, trachea midline and thyroid normal to inspection and palpation Lungs: clear to auscultation bilaterally Breasts: normal appearance, no masses or tenderness, No nipple retraction or dimpling, No nipple discharge or bleeding, No axillary or supraclavicular adenopathy Heart: regular rate and rhythm Abdomen: soft, non-tender; no masses,  no organomegaly Extremities: extremities normal, atraumatic, no cyanosis or edema Skin: Skin color, texture, turgor normal. No rashes or lesions Lymph nodes: Cervical, supraclavicular, and axillary nodes normal. No abnormal inguinal nodes  palpated Neurologic: Grossly normal   Pelvic: External genitalia:  no lesions              Urethra:  normal appearing urethra with no masses, tenderness or lesions              Bartholin's and Skene's: normal                 Vagina: normal appearing vagina with normal color and discharge, no lesions              Cervix: no cervical motion tenderness, no lesions and normal appearance              Pap taken: No. Bimanual Exam:  Uterus:  normal size, contour, position, consistency, mobility, non-tender              Adnexa: normal adnexa and no mass, fullness, tenderness               Rectovaginal: Confirms               Anus:  normal sphincter tone, no lesions  Chaperone present: yes  A:  Well Woman with normal exam,   Contraception Spouse vasectomy  Cycle change with periods every 2 months, ? Perimenopausal  Anxiety depression management with PCP, all stable per patient  Screening lab  P:   Reviewed health and wellness pertinent to exam  Discussed cycle changes can occur with thyroid, weight,pituitary change,and perimenopause. Discussed etiology and questions addressed. Labs recommended. Patient agreeable. Discussed importance of advising if no period in 3 months, to decrease risk of hyperplasia or heavy bleeding.  Continue follow up with MD as indicated.  Lab: TSH,FSH,Prolactin, CBC  Lab: Hep C  Pap smear: no   counseled on breast self exam, mammography screening, adequate intake of calcium and vitamin D, diet and exercise  return annually or prn  An After Visit Summary was printed and given to the patient.

## 2017-01-31 NOTE — Patient Instructions (Signed)
EXERCISE AND DIET:  We recommended that you start or continue a regular exercise program for good health. Regular exercise means any activity that makes your heart beat faster and makes you sweat.  We recommend exercising at least 30 minutes per day at least 3 days a week, preferably 4 or 5.  We also recommend a diet low in fat and sugar.  Inactivity, poor dietary choices and obesity can cause diabetes, heart attack, stroke, and kidney damage, among others.    ALCOHOL AND SMOKING:  Women should limit their alcohol intake to no more than 7 drinks/beers/glasses of wine (combined, not each!) per week. Moderation of alcohol intake to this level decreases your risk of breast cancer and liver damage. And of course, no recreational drugs are part of a healthy lifestyle.  And absolutely no smoking or even second hand smoke. Most people know smoking can cause heart and lung diseases, but did you know it also contributes to weakening of your bones? Aging of your skin?  Yellowing of your teeth and nails?  CALCIUM AND VITAMIN D:  Adequate intake of calcium and Vitamin D are recommended.  The recommendations for exact amounts of these supplements seem to change often, but generally speaking 600 mg of calcium (either carbonate or citrate) and 800 units of Vitamin D per day seems prudent. Certain women may benefit from higher intake of Vitamin D.  If you are among these women, your doctor will have told you during your visit.    PAP SMEARS:  Pap smears, to check for cervical cancer or precancers,  have traditionally been done yearly, although recent scientific advances have shown that most women can have pap smears less often.  However, every woman still should have a physical exam from her gynecologist every year. It will include a breast check, inspection of the vulva and vagina to check for abnormal growths or skin changes, a visual exam of the cervix, and then an exam to evaluate the size and shape of the uterus and  ovaries.  And after 49 years of age, a rectal exam is indicated to check for rectal cancers. We will also provide age appropriate advice regarding health maintenance, like when you should have certain vaccines, screening for sexually transmitted diseases, bone density testing, colonoscopy, mammograms, etc.   MAMMOGRAMS:  All women over 40 years old should have a yearly mammogram. Many facilities now offer a "3D" mammogram, which may cost around $50 extra out of pocket. If possible,  we recommend you accept the option to have the 3D mammogram performed.  It both reduces the number of women who will be called back for extra views which then turn out to be normal, and it is better than the routine mammogram at detecting truly abnormal areas.    COLONOSCOPY:  Colonoscopy to screen for colon cancer is recommended for all women at age 50.  We know, you hate the idea of the prep.  We agree, BUT, having colon cancer and not knowing it is worse!!  Colon cancer so often starts as a polyp that can be seen and removed at colonscopy, which can quite literally save your life!  And if your first colonoscopy is normal and you have no family history of colon cancer, most women don't have to have it again for 10 years.  Once every ten years, you can do something that may end up saving your life, right?  We will be happy to help you get it scheduled when you are ready.    Be sure to check your insurance coverage so you understand how much it will cost.  It may be covered as a preventative service at no cost, but you should check your particular policy.     Perimenopause Perimenopause is the time when your body begins to move into the menopause (no menstrual period for 12 straight months). It is a natural process. Perimenopause can begin 2-8 years before the menopause and usually lasts for 1 year after the menopause. During this time, your ovaries may or may not produce an egg. The ovaries vary in their production of estrogen and  progesterone hormones each month. This can cause irregular menstrual periods, difficulty getting pregnant, vaginal bleeding between periods, and uncomfortable symptoms. What are the causes?  Irregular production of the ovarian hormones, estrogen and progesterone, and not ovulating every month. Other causes include:  Tumor of the pituitary gland in the brain.  Medical disease that affects the ovaries.  Radiation treatment.  Chemotherapy.  Unknown causes.  Heavy smoking and excessive alcohol intake can bring on perimenopause sooner.  What are the signs or symptoms?  Hot flashes.  Night sweats.  Irregular menstrual periods.  Decreased sex drive.  Vaginal dryness.  Headaches.  Mood swings.  Depression.  Memory problems.  Irritability.  Tiredness.  Weight gain.  Trouble getting pregnant.  The beginning of losing bone cells (osteoporosis).  The beginning of hardening of the arteries (atherosclerosis). How is this diagnosed? Your health care provider will make a diagnosis by analyzing your age, menstrual history, and symptoms. He or she will do a physical exam and note any changes in your body, especially your female organs. Female hormone tests may or may not be helpful depending on the amount of female hormones you produce and when you produce them. However, other hormone tests may be helpful to rule out other problems. How is this treated? In some cases, no treatment is needed. The decision on whether treatment is necessary during the perimenopause should be made by you and your health care provider based on how the symptoms are affecting you and your lifestyle. Various treatments are available, such as:  Treating individual symptoms with a specific medicine for that symptom.  Herbal medicines that can help specific symptoms.  Counseling.  Group therapy.  Follow these instructions at home:  Keep track of your menstrual periods (when they occur, how heavy  they are, how long between periods, and how long they last) as well as your symptoms and when they started.  Only take over-the-counter or prescription medicines as directed by your health care provider.  Sleep and rest.  Exercise.  Eat a diet that contains calcium (good for your bones) and soy (acts like the estrogen hormone).  Do not smoke.  Avoid alcoholic beverages.  Take vitamin supplements as recommended by your health care provider. Taking vitamin E may help in certain cases.  Take calcium and vitamin D supplements to help prevent bone loss.  Group therapy is sometimes helpful.  Acupuncture may help in some cases. Contact a health care provider if:  You have questions about any symptoms you are having.  You need a referral to a specialist (gynecologist, psychiatrist, or psychologist). Get help right away if:  You have vaginal bleeding.  Your period lasts longer than 8 days.  Your periods are recurring sooner than 21 days.  You have bleeding after intercourse.  You have severe depression.  You have pain when you urinate.  You have severe headaches.  You have vision   problems. This information is not intended to replace advice given to you by your health care provider. Make sure you discuss any questions you have with your health care provider. Document Released: 03/15/2004 Document Revised: 07/14/2015 Document Reviewed: 09/04/2012 Elsevier Interactive Patient Education  2017 Elsevier Inc.  

## 2017-02-01 LAB — CBC
Hematocrit: 38 % (ref 34.0–46.6)
Hemoglobin: 12.7 g/dL (ref 11.1–15.9)
MCH: 32.4 pg (ref 26.6–33.0)
MCHC: 33.4 g/dL (ref 31.5–35.7)
MCV: 97 fL (ref 79–97)
Platelets: 317 10*3/uL (ref 150–379)
RBC: 3.92 x10E6/uL (ref 3.77–5.28)
RDW: 13.6 % (ref 12.3–15.4)
WBC: 5.9 10*3/uL (ref 3.4–10.8)

## 2017-02-01 LAB — PROLACTIN: Prolactin: 20.2 ng/mL (ref 4.8–23.3)

## 2017-02-01 LAB — TSH: TSH: 2.75 u[IU]/mL (ref 0.450–4.500)

## 2017-02-01 LAB — HEPATITIS C ANTIBODY: Hep C Virus Ab: 0.1 s/co ratio (ref 0.0–0.9)

## 2017-02-01 LAB — FOLLICLE STIMULATING HORMONE: FSH: 25.9 m[IU]/mL

## 2017-02-06 ENCOUNTER — Telehealth: Payer: Self-pay | Admitting: Certified Nurse Midwife

## 2017-02-06 NOTE — Telephone Encounter (Signed)
Lisa Perkins, CNM reviewed with Dr. Talbert Nan -needs OV with Dr. Talbert Nan on 12/20 for exam and EMB d/t history.   Spoke with patient, advised as seen above. Brief explanation of EMB provided. Patient declines to schedule EMB for 12/20, request to wait until next week. Advised patient office is closed d/t holiday, will reopen on 12/26. Dr. Talbert Nan and Lisa Perkins, CNM are out of the office, will review on 12/20 and return call. Patient is agreeable.   Dr. Talbert Nan -please review.   Cc: Lisa Perkins, CNM

## 2017-02-06 NOTE — Telephone Encounter (Signed)
Patient calling with update for Melvia Heaps, CNM. States she has been on cycle continuously, tomorrow makes 4 weeks. Flow is heavier today, changing pad 2 times per day. Notices menstrual like cramping today.   Denies any other symptoms.  Advised patient will review with Melvia Heaps, CNM and return call with recommendations, patient is agreeable.   Melvia Heaps, CNM -please advise?

## 2017-02-06 NOTE — Telephone Encounter (Signed)
Patient was seen 01/31/17 and still having her cycle. Patient said Evalee Mutton, CNM told her to call if she was still having a cycle.

## 2017-02-07 ENCOUNTER — Other Ambulatory Visit: Payer: Self-pay

## 2017-02-07 ENCOUNTER — Encounter: Payer: Self-pay | Admitting: Obstetrics and Gynecology

## 2017-02-07 ENCOUNTER — Ambulatory Visit (INDEPENDENT_AMBULATORY_CARE_PROVIDER_SITE_OTHER): Payer: BLUE CROSS/BLUE SHIELD | Admitting: Obstetrics and Gynecology

## 2017-02-07 VITALS — BP 118/78 | HR 76 | Resp 14 | Wt 147.0 lb

## 2017-02-07 DIAGNOSIS — N939 Abnormal uterine and vaginal bleeding, unspecified: Secondary | ICD-10-CM | POA: Diagnosis not present

## 2017-02-07 MED ORDER — MEDROXYPROGESTERONE ACETATE 10 MG PO TABS: ORAL_TABLET | ORAL | 1 refills | Status: DC

## 2017-02-07 NOTE — Progress Notes (Signed)
GYNECOLOGY  VISIT   HPI: 51 y.o.   Married  Caucasian  female   G0P0000 with Patient's last menstrual period was 01/05/2017.   here c/o irregular uterine bleeding. Prior to the last year her cycles were monthly. In the last year her cycles have been coming every 2-3 months x 5 days with moderate flow. Her last cycle started on 01/05/17 and she is still bleeding. Her flow is light to moderate. Hasn't been heavy. She is having cramping this week, nothing prior. Vasectomy is her method of contraception. But hasn't been sexually active x 5 years secondary to husbands medical issues (no chance of pregnacy). No change in fatigue or constipation. No galactorrhea.  Not currently having hot flashes, night sweats of vaginal dryness. Normal CBC, TSH and prolactin last week.   GYNECOLOGIC HISTORY: Patient's last menstrual period was 01/05/2017. Contraception:vasectomy  Menopausal hormone therapy: none         OB History    Gravida Para Term Preterm AB Living   0 0 0 0 0 0   SAB TAB Ectopic Multiple Live Births   0 0 0 0 0         Patient Active Problem List   Diagnosis Date Noted  . Anxiety 01/31/2017  . Bipolar disorder ([place]) 01/31/2017  . Depressive disorder 01/31/2017  . Iron deficiency anemia 05/11/2015  . Allergic sinusitis 06/05/2013  . Muscle spasms of neck trapezious 04/22/2012  . GERD (gastroesophageal reflux disease) 04/22/2012  . Drug interaction 04/22/2012  . BENIGN NEOPLASM OF SKIN SITE UNSPECIFIED 07/25/2009  . DEPRESSION 11/07/2007  . ALLERGIC RHINITIS 11/07/2007  . 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 GERD 11/07/2007    Past Medical History:  Diagnosis Date  . Abnormal Pap smear of cervix    just a repeat done  . ALLERGIC RHINITIS 11/07/2007  . Asthma as child  . BENIGN NEOPLASM OF SKIN SITE UNSPECIFIED 07/25/2009  . DEPRESSION 11/07/2007  . GERD 11/07/2007   LPR - laryngopharyngeal reflux  . 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  TONSILLECTOMY AND ADENOIDECTOMY    . WISDOM TOOTH EXTRACTION      Current Outpatient Medications  Medication Sig Dispense Refill  . albuterol (PROVENTIL HFA;VENTOLIN HFA) 108 (90 BASE) MCG/ACT inhaler Inhale 2 puffs into the lungs every 6 (six) hours as needed for wheezing or shortness of breath. 1 Inhaler 2  . citalopram (CELEXA) 20 MG tablet Take 1.5 tablets by mouth daily.    . clonazePAM (KLONOPIN) 0.5 MG tablet Take 0.5 mg by mouth at bedtime as needed.     Marland Kitchen ibuprofen (ADVIL,MOTRIN) 200 MG tablet Take 200 mg by mouth every 6 (six) hours as needed.    . lamoTRIgine (LAMICTAL) 100 MG tablet Take 1 tablet by mouth daily.    . lansoprazole (PREVACID) 30 MG capsule     . olopatadine (PATANOL) 0.1 % ophthalmic solution PLACE 1 DROP INTO BOTH EYES 2 (TWO) TIMES DAILY. 5 mL 2   No current facility-administered medications for this visit.      ALLERGIES: Amoxicillin and Erythromycin  Family History  Problem Relation Age of Onset  . Endometriosis Mother   . Hypertension Mother   . Diabetes Maternal Grandmother   . Hypertension Maternal Grandmother   . Stroke Maternal Grandmother   . COPD Father   . Dementia Father   . Hypertension Maternal Grandfather   . Stroke Maternal Grandfather   . Stroke Paternal Grandmother   . Stroke Paternal  Grandfather   . Diabetes Maternal Uncle   . Breast cancer Paternal Aunt 33       bilateral mastectomy - survivor  . Asthma Neg Hx        family hx    Social History   Socioeconomic History  . Marital status: Married    Spouse name: Not on file  . Number of children: Not on file  . Years of education: Not on file  . Highest education level: Not on file  Social Needs  . Financial resource strain: Not on file  . Food insecurity - worry: Not on file  . Food insecurity - inability: Not on file  . Transportation needs - medical: Not on file  . Transportation needs - non-medical: Not on file  Occupational History  . Not on file  Tobacco Use   . Smoking status: Never Smoker  . Smokeless tobacco: Never Used  Substance and Sexual Activity  . Alcohol use: Yes    Alcohol/week: 0.0 oz    Comment: 1 drink per month  . Drug use: No  . Sexual activity: Not Currently    Partners: Male    Comment: husband vasectomy  Other Topics Concern  . 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   HENT: Negative.   Eyes: Negative.   Respiratory: Negative.   Cardiovascular: Negative.   Gastrointestinal: Negative.   Genitourinary:       Abnormal uterine bleeding x 4 weeks  Musculoskeletal: Negative.   Skin: Negative.   Neurological: Negative.   Endo/Heme/Allergies: Negative.   Psychiatric/Behavioral: Negative.     PHYSICAL EXAMINATION:    BP 118/78 (BP Location: Right Arm, Patient Position: Sitting, Cuff Size: Normal)   Pulse 76   Resp 14   Wt 147 lb (66.7 kg)   LMP 01/05/2017   BMI 23.91 kg/m     General appearance: alert, cooperative and appears stated age Neck: no adenopathy, supple, symmetrical, trachea midline and thyroid normal to inspection and palpation Abdomen: soft, non-tender; non distended, no masses,  no organomegaly  Pelvic: External genitalia:  no lesions              Urethra:  normal appearing urethra with no masses, tenderness or lesions              Bartholins and Skenes: normal                 Vagina: normal appearing vagina with normal color and discharge, no lesions              Cervix: no lesions              Bimanual Exam:  Uterus:  normal size, contour, position, consistency, mobility, non-tender and anteflexed              Adnexa: no mass, fullness, tenderness                The risks of endometrial biopsy were reviewed and a consent was obtained.  A speculum was placed in the vagina and the cervix was cleansed with betadine. A mini-pipelle was placed into the endometrial cavity. The uterus  sounded to 8 cm. The endometrial biopsy was performed, moderate tissue was obtained. The speculum was removed. There were no complications.    Chaperone was present for exam.  ASSESSMENT Abnormal uterine bleeding,  c/w anovulatory bleed (not sexually active)    PLAN Endometrial biopsy done Provera 10 mg x 10 days. If her bleeding doesn't stop in the next 1-2 days patient advised to double up on the provera (refill sent) Further plans depending on the results.   An After Visit Summary was printed and given to the patient.   CC: Evalee Mutton, CNM

## 2017-02-07 NOTE — Patient Instructions (Signed)

## 2017-02-07 NOTE — Telephone Encounter (Signed)
Patient walked into the office this morning requesting to be seen for evaluation and EMB at 8 am with Dr.Jertson. Advised appointment slot for 8 am is no longer available. Appointment scheduled for today 02/07/2017 at 3:30 pm with Dr.Jertson. Advised to take 800 mg of Ibuprofen, Motrin, or Advil 1 hours before her appointment. Make sure to eat lunch and drink plenty of fluids. Patient is agreeable.  Cc: Melvia Heaps CNM   Routing to provider for final review. Patient agreeable to disposition. Will close encounter.

## 2017-02-13 ENCOUNTER — Telehealth: Payer: Self-pay | Admitting: Obstetrics and Gynecology

## 2017-02-13 NOTE — Telephone Encounter (Signed)
Lisa Perkins should take the medication for 10 days with no or just light bleeding. Keep her appointment with Ms Hollice Espy next week. Call with heavy bleeding or any other concerns.

## 2017-02-13 NOTE — Telephone Encounter (Signed)
Patient has been continuously bleeding for 6 weeks and the pharmacy will not let her refill the progesterone.

## 2017-02-13 NOTE — Telephone Encounter (Signed)
Spoke with patient. Advised will need to continue taking Provera 10 mg BID. Advised rx is being processed at this time. Patient is agreeable.  Routing to [place] for review before closing.

## 2017-02-13 NOTE — Telephone Encounter (Signed)
Patient states that she started doubling her Provera on 02/12/2017 as she had not stopped bleeding. Has 1 pill left. Is still having moderate bleeding. Changing her pad twice per day. States the pharmacy told her it is too early to refill her rx. Advised will speak with Dr.Jertson and the pharmacy and return call.  Spoke with Dr.Jertson who would like the patient to continue taking Provera 10 mg BID at this time. Spoke with Webb Silversmith at [facility] who states this rx can be refilled at this time. Webb Silversmith will process rx for the patient.

## 2017-02-14 NOTE — Telephone Encounter (Signed)
Left detailed message at number provided [phone], okay per ROI. Advised of message as seen below from [place]. Advised to return call with any additional questions. Encounter closed.

## 2017-02-15 ENCOUNTER — Telehealth: Payer: Self-pay

## 2017-02-15 MED ORDER — MEDROXYPROGESTERONE ACETATE 5 MG PO TABS: 5.0000 mg | ORAL_TABLET | Freq: Every day | ORAL | 1 refills | Status: DC

## 2017-02-15 NOTE — Telephone Encounter (Signed)
Spoke with patient and notified of negative biopsy results. Reviewed Dr.Jertson's recommendations of cyclic Provera 5mg  x 5 days every other month if no spontaneous menses. Patient voices understanding and Rx sent to pharmacy.

## 2017-02-15 NOTE — Telephone Encounter (Signed)
-----   Message from Salvadore Dom, MD sent at 02/14/2017  5:32 PM EST ----- Please check on the patient and let her know that her biopsy results were benign. She is currently on provera for an anovulatory bleed.  I would recommend cyclic provera for her, 5 mg x 5 days every other month if no spontaneous menses.

## 2017-02-18 ENCOUNTER — Other Ambulatory Visit: Payer: Self-pay

## 2017-02-18 ENCOUNTER — Ambulatory Visit (INDEPENDENT_AMBULATORY_CARE_PROVIDER_SITE_OTHER): Payer: BLUE CROSS/BLUE SHIELD | Admitting: Obstetrics and Gynecology

## 2017-02-18 ENCOUNTER — Encounter: Payer: Self-pay | Admitting: Obstetrics and Gynecology

## 2017-02-18 ENCOUNTER — Telehealth: Payer: Self-pay | Admitting: Obstetrics and Gynecology

## 2017-02-18 VITALS — BP 138/70 | HR 108 | Temp 99.4°F | Resp 14 | Wt 148.0 lb

## 2017-02-18 DIAGNOSIS — R509 Fever, unspecified: Secondary | ICD-10-CM

## 2017-02-18 DIAGNOSIS — R42 Dizziness and giddiness: Secondary | ICD-10-CM

## 2017-02-18 DIAGNOSIS — N939 Abnormal uterine and vaginal bleeding, unspecified: Secondary | ICD-10-CM

## 2017-02-18 LAB — CBC WITH DIFFERENTIAL/PLATELET
Basophils Absolute: 0 10*3/uL (ref 0.0–0.2)
Basos: 0 %
EOS (ABSOLUTE): 0.1 10*3/uL (ref 0.0–0.4)
Eos: 2 %
Hematocrit: 33.8 % — ABNORMAL LOW (ref 34.0–46.6)
Hemoglobin: 11.5 g/dL (ref 11.1–15.9)
Lymphocytes Absolute: 1.2 10*3/uL (ref 0.7–3.1)
Lymphs: 38 %
MCH: 31.1 pg (ref 26.6–33.0)
MCHC: 34 g/dL (ref 31.5–35.7)
MCV: 91 fL (ref 79–97)
Monocytes Absolute: 0.6 10*3/uL (ref 0.1–0.9)
Monocytes: 18 %
Neutrophils Absolute: 1.3 10*3/uL — ABNORMAL LOW (ref 1.4–7.0)
Neutrophils: 42 %
Platelets: 273 10*3/uL (ref 150–379)
RBC: 3.7 x10E6/uL — ABNORMAL LOW (ref 3.77–5.28)
RDW: 12.6 % (ref 12.3–15.4)
WBC: 3.1 10*3/uL — ABNORMAL LOW (ref 3.4–10.8)

## 2017-02-18 LAB — POCT URINALYSIS DIPSTICK
Bilirubin, UA: NEGATIVE
Glucose, UA: NEGATIVE
Ketones, UA: NEGATIVE
Leukocytes, UA: NEGATIVE
Nitrite, UA: NEGATIVE
Protein, UA: NEGATIVE
Urobilinogen, UA: NEGATIVE E.U./dL — AB
pH, UA: 5 (ref 5.0–8.0)

## 2017-02-18 NOTE — Telephone Encounter (Signed)
Patient is taking Provera and states this is the second round of doubling up on the Provera to stop her bleeding.  Patient states she is still bleeding and thinks it is affecting her depression and unable to sleep.

## 2017-02-18 NOTE — Telephone Encounter (Addendum)
Spoke with patient. Patient states she is on last day of Provera 10 mg bid and bleeding has not stopped. States this is week 6 of her cycle. Feels like depression, insomnia and irritability is increasing.  Reports changing pad 2 x/day, moderate cramping, fatigue, lightheadedness and dizziness.   Denies N/V, fever/chills.  Recommended OV for further evaluation. Advised patient Dr. Talbert Nan is in surgery, will review scheduling with nursing supervisor and return call, patient is agreeable.   Routing to Dr. Talbert Nan.

## 2017-02-18 NOTE — Progress Notes (Signed)
GYNECOLOGY  VISIT   HPI: 51 y.o.   Married  Caucasian  female   G0P0000 with No LMP recorded. Patient is not currently having periods (Reason: Perimenopausal).   here c/o irregular uterine bleeding. Patient has been bleeding for about 6 weeks. The patient was seen on 02/07/17 with a likely anovulatory bleed and she was treated with provera. Endometrial biopsy showed inactive endometrium.  Her Bernard from 3 weeks ago was just in the menopausal range at 25.9. She had a normal prolactin and normal CBC (01/31/17). In the last year her cycles have been every 2-3 months. LMP started on 01/05/17, never stopped with provera. She was going through 2 pads a day. Today is the last day of her provera. Yesterday she started feeling light headed and dizzy. Cramping started last night. Feels like menstrual cramps, intermittent, up to a 3/10 in severity. Temp of 99.3 at home with ibuprofen. +/- nausea. No emesis, vomiting, diarrhea, comstipation. No bladder complaints.  Not sexually active secondary to husbands medical issues.    GYNECOLOGIC HISTORY: No LMP recorded. Patient is not currently having periods (Reason: Perimenopausal). Contraception:vasectomy  Menopausal hormone therapy: none        OB History    Gravida Para Term Preterm AB Living   0 0 0 0 0 0   SAB TAB Ectopic Multiple Live Births   0 0 0 0 0         Patient Active Problem List   Diagnosis Date Noted  . Anxiety 01/31/2017  . Bipolar disorder ([place]) 01/31/2017  . Depressive disorder 01/31/2017  . Iron deficiency anemia 05/11/2015  . Allergic sinusitis 06/05/2013  . Muscle spasms of neck trapezious 04/22/2012  . GERD (gastroesophageal reflux disease) 04/22/2012  . Drug interaction 04/22/2012  . BENIGN NEOPLASM OF SKIN SITE UNSPECIFIED 07/25/2009  . DEPRESSION 11/07/2007  . ALLERGIC RHINITIS 11/07/2007  . 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 GERD 11/07/2007    Past Medical History:  Diagnosis Date  . Abnormal Pap smear of cervix    just a  repeat done  . ALLERGIC RHINITIS 11/07/2007  . Asthma as child  . BENIGN NEOPLASM OF SKIN SITE UNSPECIFIED 07/25/2009  . DEPRESSION 11/07/2007  . GERD 11/07/2007   LPR - laryngopharyngeal reflux  . 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 TONSILLECTOMY AND ADENOIDECTOMY    . WISDOM TOOTH EXTRACTION      Current Outpatient Medications  Medication Sig Dispense Refill  . albuterol (PROVENTIL HFA;VENTOLIN HFA) 108 (90 BASE) MCG/ACT inhaler Inhale 2 puffs into the lungs every 6 (six) hours as needed for wheezing or shortness of breath. 1 Inhaler 2  . citalopram (CELEXA) 20 MG tablet Take 1.5 tablets by mouth daily.    . clonazePAM (KLONOPIN) 0.5 MG tablet Take 0.5 mg by mouth at bedtime as needed.     Marland Kitchen ibuprofen (ADVIL,MOTRIN) 200 MG tablet Take 200 mg by mouth every 6 (six) hours as needed.    . lamoTRIgine (LAMICTAL) 100 MG tablet Take 1 tablet by mouth daily.    . lansoprazole (PREVACID) 30 MG capsule     . medroxyPROGESTERone (PROVERA) 10 MG tablet Take one tablet a day for 10 days, if you bleeding doesn't significantly slow down or stop in the next 1-2 days increase to taking one tablet BID 10 tablet 1  . medroxyPROGESTERone (PROVERA) 5 MG tablet Take 1 tablet (5 mg total) by mouth daily. for 5 days 15 tablet 1  . olopatadine (PATANOL)  0.1 % ophthalmic solution PLACE 1 DROP INTO BOTH EYES 2 (TWO) TIMES DAILY. 5 mL 2   No current facility-administered medications for this visit.      ALLERGIES: Amoxicillin and Erythromycin  Family History  Problem Relation Age of Onset  . Endometriosis Mother   . Hypertension Mother   . Diabetes Maternal Grandmother   . Hypertension Maternal Grandmother   . Stroke Maternal Grandmother   . COPD Father   . Dementia Father   . Hypertension Maternal Grandfather   . Stroke Maternal Grandfather   . Stroke Paternal Grandmother   . Stroke Paternal Grandfather   . Diabetes Maternal Uncle   . Breast cancer  Paternal Aunt 41       bilateral mastectomy - survivor  . Asthma Neg Hx        family hx    Social History   Socioeconomic History  . Marital status: Married    Spouse name: Not on file  . Number of children: Not on file  . Years of education: Not on file  . Highest education level: Not on file  Social Needs  . Financial resource strain: Not on file  . Food insecurity - worry: Not on file  . Food insecurity - inability: Not on file  . Transportation needs - medical: Not on file  . Transportation needs - non-medical: Not on file  Occupational History  . Not on file  Tobacco Use  . Smoking status: Never Smoker  . Smokeless tobacco: Never Used  Substance and Sexual Activity  . Alcohol use: Yes    Alcohol/week: 0.0 oz    Comment: 1 drink per month  . Drug use: No  . Sexual activity: Not Currently    Partners: Male    Comment: husband vasectomy  Other Topics Concern  . 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    Non smoker     Review of Systems  Constitutional: Positive for malaise/fatigue.  HENT: Negative.   Eyes: Negative.   Respiratory: Negative.   Cardiovascular: Negative.   Gastrointestinal:       Bloating  Genitourinary:       Irregular uterine bleeding  Musculoskeletal: Negative.   Skin: Negative.   Neurological: Negative.   Endo/Heme/Allergies: Negative.   Psychiatric/Behavioral: Positive for depression.    PHYSICAL EXAMINATION:    BP 138/70 (BP Location: Right Arm, Patient Position: Sitting, Cuff Size: Normal)   Pulse (!) 108   Resp 14   Wt 148 lb (67.1 kg)   BMI 24.07 kg/m     General appearance: alert, cooperative and appears stated age Neck: no adenopathy, supple, symmetrical, trachea midline and thyroid normal to inspection and palpation  CV: tachycardic, regular rhythm, no murmur.  Abdomen: soft, non-tender; non distended, no masses,  no organomegaly  Pelvic: External genitalia:  no lesions               Urethra:  normal appearing urethra with no masses, tenderness or lesions              Bartholins and Skenes: normal                 Vagina: normal appearing vagina with normal color and discharge, no lesions, small amount of blood in the vagina.               Cervix: no cervical motion tenderness and no lesions  Bimanual Exam:  Uterus:  normal size, contour, position, consistency, mobility, non-tender              Adnexa: no mass, fullness, tenderness              Rectovaginal: Yes.  .  Confirms.              Anus:  normal sphincter tone, no lesions  Chaperone was present for exam.  ASSESSMENT Prolonged bleed in perimenopausal female, negative biopsy. Not bleeding heavily. Now not feeling well, low grade temp, tachycardic, light headed. Not tender on abdominal/pelvic exam.     PLAN CBC with diff stat (will get office hgb) CCUA, dip urine Set her up for an ultrasound on Thursday She is taking ibuprofen for discomfort, will check her temp prior to taking.    An After Visit Summary was printed and given to the patient.  ~25 minutes face to face time of which over 50% was spent in counseling.   Addendum: Hgb 11.3 gm/dl  Addendum:  CBC returned with low WBC of 3.1, low neutrophils at 1.3, normal lymphs. Hgb of 11.5 gm/dl, Hct of 33.8% (normal 34-46%)  Called patient to review her lab work, she will get a f/u CBC with diff with her primary in a week. Knows that if she is feeling worse she should be seen sooner.   CC: Dr Regis Bill

## 2017-02-18 NOTE — Telephone Encounter (Signed)
Left message to call Sharee Pimple at [phone].  Reviewed with Kandace Blitz, RN -routing to Dr. Talbert Nan to review and advise?

## 2017-02-18 NOTE — Telephone Encounter (Signed)
Spoke with patient, OV scheduled for today at 2pm with Dr. Talbert Nan. Patient verbalizes understanding and is agreeable.   Routing to provider for final review. Patient is agreeable to disposition. Will close encounter.

## 2017-02-18 NOTE — Telephone Encounter (Signed)
She needs to be seen, please add her on to the schedule.

## 2017-02-21 ENCOUNTER — Ambulatory Visit (INDEPENDENT_AMBULATORY_CARE_PROVIDER_SITE_OTHER): Payer: BLUE CROSS/BLUE SHIELD

## 2017-02-21 ENCOUNTER — Ambulatory Visit (INDEPENDENT_AMBULATORY_CARE_PROVIDER_SITE_OTHER): Payer: BLUE CROSS/BLUE SHIELD | Admitting: Obstetrics and Gynecology

## 2017-02-21 ENCOUNTER — Encounter: Payer: Self-pay | Admitting: Obstetrics and Gynecology

## 2017-02-21 ENCOUNTER — Other Ambulatory Visit: Payer: Self-pay

## 2017-02-21 VITALS — BP 128/72 | HR 80 | Resp 14 | Wt 148.0 lb

## 2017-02-21 DIAGNOSIS — N939 Abnormal uterine and vaginal bleeding, unspecified: Secondary | ICD-10-CM

## 2017-02-21 NOTE — Progress Notes (Signed)
GYNECOLOGY  VISIT   HPI: 52 y.o.   Married  Caucasian  female   G0P0000 with No LMP recorded. Patient is not currently having periods (Reason: Perimenopausal).   here for follow up abnormal uterine bleeding. Patient c/o heavy bleeding today    She has had an episode of prolonged bleeding, biopsy with inactive endometrium. She was treated with provera for 10 days, bleeding stayed light, but didn't stop. She just finished the provera a few days ago and is now having heavy bleeding. She is changing her pad every 2 hours. Not overflowing.  She was seen 3 days ago, not feeling well, suspect viral infection. She is feeling better than the other day  GYNECOLOGIC HISTORY: No LMP recorded. Patient is not currently having periods (Reason: Perimenopausal). Contraception:vasectomy  Menopausal hormone therapy: none         OB History    Gravida Para Term Preterm AB Living   0 0 0 0 0 0   SAB TAB Ectopic Multiple Live Births   0 0 0 0 0         Patient Active Problem List   Diagnosis Date Noted  . Anxiety 01/31/2017  . Bipolar disorder ([place]) 01/31/2017  . Depressive disorder 01/31/2017  . Iron deficiency anemia 05/11/2015  . Allergic sinusitis 06/05/2013  . Muscle spasms of neck trapezious 04/22/2012  . GERD (gastroesophageal reflux disease) 04/22/2012  . Drug interaction 04/22/2012  . BENIGN NEOPLASM OF SKIN SITE UNSPECIFIED 07/25/2009  . DEPRESSION 11/07/2007  . ALLERGIC RHINITIS 11/07/2007  . 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 GERD 11/07/2007    Past Medical History:  Diagnosis Date  . Abnormal Pap smear of cervix    just a repeat done  . ALLERGIC RHINITIS 11/07/2007  . Asthma as child  . BENIGN NEOPLASM OF SKIN SITE UNSPECIFIED 07/25/2009  . DEPRESSION 11/07/2007  . GERD 11/07/2007   LPR - laryngopharyngeal reflux  . 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 TONSILLECTOMY AND ADENOIDECTOMY    . WISDOM TOOTH EXTRACTION      Current Outpatient  Medications  Medication Sig Dispense Refill  . albuterol (PROVENTIL HFA;VENTOLIN HFA) 108 (90 BASE) MCG/ACT inhaler Inhale 2 puffs into the lungs every 6 (six) hours as needed for wheezing or shortness of breath. 1 Inhaler 2  . citalopram (CELEXA) 20 MG tablet Take 1.5 tablets by mouth daily.    . clonazePAM (KLONOPIN) 0.5 MG tablet Take 0.5 mg by mouth at bedtime as needed.     Marland Kitchen ibuprofen (ADVIL,MOTRIN) 200 MG tablet Take 200 mg by mouth every 6 (six) hours as needed.    . lansoprazole (PREVACID) 30 MG capsule     . olopatadine (PATANOL) 0.1 % ophthalmic solution PLACE 1 DROP INTO BOTH EYES 2 (TWO) TIMES DAILY. 5 mL 2  . lamoTRIgine (LAMICTAL) 100 MG tablet Take 1 tablet by mouth daily.     No current facility-administered medications for this visit.      ALLERGIES: Amoxicillin and Erythromycin  Family History  Problem Relation Age of Onset  . Endometriosis Mother   . Hypertension Mother   . Diabetes Maternal Grandmother   . Hypertension Maternal Grandmother   . Stroke Maternal Grandmother   . COPD Father   . Dementia Father   . Hypertension Maternal Grandfather   . Stroke Maternal Grandfather   . Stroke Paternal Grandmother   . Stroke Paternal Grandfather   . Diabetes Maternal Uncle   . Breast cancer Paternal  Aunt 20       bilateral mastectomy - survivor  . Asthma Neg Hx        family hx    Social History   Socioeconomic History  . Marital status: Married    Spouse name: Not on file  . Number of children: Not on file  . Years of education: Not on file  . Highest education level: Not on file  Social Needs  . Financial resource strain: Not on file  . Food insecurity - worry: Not on file  . Food insecurity - inability: Not on file  . Transportation needs - medical: Not on file  . Transportation needs - non-medical: Not on file  Occupational History  . Not on file  Tobacco Use  . Smoking status: Never Smoker  . Smokeless tobacco: Never Used  Substance and Sexual  Activity  . Alcohol use: Yes    Alcohol/week: 0.0 oz    Comment: 1 drink per month  . Drug use: No  . Sexual activity: Not Currently    Partners: Male    Comment: husband vasectomy  Other Topics Concern  . 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   HENT: Negative.   Eyes: Negative.   Respiratory: Negative.   Cardiovascular: Negative.   Gastrointestinal: Negative.   Genitourinary:       Heavy bleeding   Musculoskeletal: Negative.   Skin: Negative.   Neurological: Negative.   Endo/Heme/Allergies: Negative.   Psychiatric/Behavioral: Negative.     PHYSICAL EXAMINATION:    BP 128/72 (BP Location: Right Arm, Patient Position: Sitting, Cuff Size: Normal)   Pulse 80   Resp 14   Wt 148 lb (67.1 kg)   BMI 24.07 kg/m     General appearance: alert, cooperative and appears stated age  Ultrasound images reviewed with the patient  ASSESSMENT Abnormal uterine bleeding, negative biopsy, mild anemia, just finished provera and is having a w/d bleed Viral illness, feeling better.     PLAN Call if saturating >1 pad an hour or if her bleeding doesn't stop in the next week She is on iron Will f/u with her primary for a repeat CBC (low WBC earlier this week) She has a script for cyclic provera to take as needed   An After Visit Summary was printed and given to the patient.  ~10 minutes face to face time of which over 50% was spent in counseling.

## 2017-03-19 ENCOUNTER — Ambulatory Visit: Payer: Self-pay | Admitting: Adult Health

## 2017-04-10 ENCOUNTER — Encounter: Payer: Self-pay | Admitting: Internal Medicine

## 2017-06-06 NOTE — Progress Notes (Signed)
Chief Complaint  Patient presents with  . Annual Exam    No new concerns.     HPI: Patient  Lisa Perkins  52 y.o. comes in today for Preventive Health Care visit    Shirlean Mylar bridges.   Prescribes psych meds and doing ok   perimenopauseal  Some mood effect .   Had elevated lipids on a screen   Med or reflux doin ok   Allergies stable  Health Maintenance  Topic Date Due  . INFLUENZA VACCINE  10/31/2020 (Originally 09/19/2017)  . PAP SMEAR  10/21/2017  . TETANUS/TDAP  02/19/2021  . HIV Screening  Completed   Health Maintenance Review LIFESTYLE:  Exercise:   Minimal  Lack of time  Tobacco/ETS: no Alcohol:   Rare  Sugar beverages:minimal diet sodas  Sleep: 8 hours  Drug use: no HH of 2  1 dog  Work:  9 per day x 5  And some on weekend .   ROS:  GEN/ HEENT: No fever, significant weight changes sweats headaches vision problems hearing changes, CV/ PULM; No chest pain shortness of breath cough, syncope,edema  change in exercise tolerance. GI /GU: No adominal pain, vomiting, change in bowel habits. No blood in the stool. No significant GU symptoms. SKIN/HEME: ,no acute skin rashes suspicious lesions or bleeding. No lymphadenopathy, nodules, masses.  NEURO/ PSYCH:  No neurologic signs such as weakness numbness. No depression anxiety. IMM/ Allergy: No unusual infections.  Allergy .   REST of 12 system review negative except as per HPI   Past Medical History:  Diagnosis Date  . Abnormal Pap smear of cervix    just a repeat done  . ALLERGIC RHINITIS 11/07/2007  . Asthma as child  . BENIGN NEOPLASM OF SKIN SITE UNSPECIFIED 07/25/2009  . DEPRESSION 11/07/2007  . GERD 11/07/2007   LPR - laryngopharyngeal reflux  . 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 TONSILLECTOMY AND ADENOIDECTOMY    . WISDOM TOOTH EXTRACTION      Family History  Problem Relation Age of Onset  . Endometriosis Mother   . Hypertension Mother   . Diabetes  Maternal Grandmother   . Hypertension Maternal Grandmother   . Stroke Maternal Grandmother   . COPD Father   . Dementia Father   . Hypertension Maternal Grandfather   . Stroke Maternal Grandfather   . Stroke Paternal Grandmother   . Stroke Paternal Grandfather   . Diabetes Maternal Uncle   . Breast cancer Paternal Aunt 69       bilateral mastectomy - survivor  . Asthma Neg Hx        family hx    Social History   Socioeconomic History  . Marital status: Married    Spouse name: Not on file  . Number of children: Not on file  . Years of education: Not on file  . Highest education level: Not on file  Occupational History  . Not on file  Social Needs  . Financial resource strain: Not on file  . Food insecurity:    Worry: Not on file    Inability: Not on file  . Transportation needs:    Medical: Not on file    Non-medical: Not on file  Tobacco Use  . Smoking status: Never Smoker  . Smokeless tobacco: Never Used  Substance and Sexual Activity  . Alcohol use: Yes    Alcohol/week: 0.0 oz    Comment: 1 drink per month  .  Drug use: No  . Sexual activity: Not Currently    Partners: Male    Comment: husband vasectomy  Lifestyle  . Physical activity:    Days per week: Not on file    Minutes per session: Not on file  . Stress: Not on file  Relationships  . Social connections:    Talks on phone: Not on file    Gets together: Not on file    Attends religious service: Not on file    Active member of club or organization: Not on file    Attends meetings of clubs or organizations: Not on file    Relationship status: Not on file  Other Topics Concern  . 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    Non smoker     Outpatient Medications Prior to Visit  Medication Sig Dispense Refill  . albuterol (PROVENTIL HFA;VENTOLIN HFA) 108 (90 BASE) MCG/ACT inhaler Inhale 2 puffs into the lungs every 6 (six) hours as needed for wheezing or  shortness of breath. 1 Inhaler 2  . citalopram (CELEXA) 20 MG tablet Take 1.5 tablets by mouth daily.    . clonazePAM (KLONOPIN) 0.5 MG tablet Take 0.5 mg by mouth at bedtime as needed.     Marland Kitchen ibuprofen (ADVIL,MOTRIN) 200 MG tablet Take 200 mg by mouth every 6 (six) hours as needed.    . lamoTRIgine (LAMICTAL) 150 MG tablet Take 1 tablet by mouth daily.    . lansoprazole (PREVACID) 30 MG capsule     . olopatadine (PATANOL) 0.1 % ophthalmic solution PLACE 1 DROP INTO BOTH EYES 2 (TWO) TIMES DAILY. 5 mL 2   No facility-administered medications prior to visit.      EXAM:  BP 98/66 (BP Location: Right Arm, Patient Position: Sitting, Cuff Size: Normal)   Pulse 68   Temp 98 F (36.7 C) (Oral)   Ht 5' 5.5" (1.664 m)   Wt 146 lb 9.6 oz (66.5 kg)   BMI 24.02 kg/m   Body mass index is 24.02 kg/m. Wt Readings from Last 3 Encounters:  06/10/17 146 lb 9.6 oz (66.5 kg)  02/21/17 148 lb (67.1 kg)  02/18/17 148 lb (67.1 kg)    Physical Exam: Vital signs reviewed [id number] is a well-developed well-nourished alert cooperative    who appearsr stated age in no acute distress.  HEENT: normocephalic atraumatic , Eyes: PERRL EOM's full, conjunctiva clear, Nares: paten,t no deformity discharge or tenderness., Ears: no deformity EAC's clear TMs with normal landmarks. Mouth: clear OP, no lesions, edema.  Moist mucous membranes. Dentition in adequate repair. NECK: supple without masses, thyromegaly or bruits. CHEST/PULM:  Clear to auscultation and percussion breath sounds equal no wheeze , rales or rhonchi. No chest wall deformities or tenderness. Breast: normal by inspection . No dimpling, discharge, masses, tenderness or discharge . CV: PMI is nondisplaced, S1 S2 no gallops, murmurs, rubs. Peripheral pulses are full without delay.No JVD .  ABDOMEN: Bowel sounds normal nontender  No guard or rebound, no hepato splenomegal no CVA tenderness.  No hernia. Extremtities:  No clubbing cyanosis or edema, no  acute joint swelling or redness no focal atrophy NEURO:  Oriented x3, cranial nerves 3-12 appear to be intact, no obvious focal weakness,gait within normal limits no abnormal reflexes or asymmetrical SKIN: No acute rashes normal turgor, color, no bruising or petechiae. PSYCH: Oriented, good eye contact, no obvious depression anxiety, cognition and judgment appear normal. LN: no cervical  axillary inguinal adenopathy  Lab Results  Component Value Date   WBC 4.9 06/10/2017   HGB 12.6 06/10/2017   HCT 37.1 06/10/2017   PLT 308.0 06/10/2017   GLUCOSE 90 06/10/2017   CHOL 183 06/10/2017   TRIG 58.0 06/10/2017   HDL 67.10 06/10/2017   LDLCALC 104 (H) 06/10/2017   ALT 11 06/10/2017   AST 13 06/10/2017   NA 137 06/10/2017   K 4.8 06/10/2017   CL 100 06/10/2017   CREATININE 1.08 06/10/2017   BUN 14 06/10/2017   CO2 28 06/10/2017   TSH 2.11 06/10/2017    BP Readings from Last 3 Encounters:  06/10/17 98/66  02/21/17 128/72  02/18/17 138/70      ASSESSMENT AND PLAN:  Discussed the following assessment and plan:  Visit for preventive health examination - Plan: Lipid panel, TSH, CMP, CBC with Differential/Platelet  Medication management - Plan: Lipid panel, TSH, CMP, CBC with Differential/Platelet  Elevated cholesterol - Plan: Lipid panel, TSH, CMP, CBC with Differential/Platelet Counseled. hcm utd add exercise activity etc  Patient Care Team: Panosh, Standley Brooking, MD as PCP - [facility] Salvadore Dom, MD as Consulting Physician (Obstetrics and Gynecology) Janeth Rase, NP as Nurse Practitioner (Adult Health Nurse Practitioner) Patient Instructions  Will notify you  of labs when available.  Continue lifestyle intervention healthy eating and exercise .  add some acitivity as discussed but get your sleep.    Health Maintenance, Female Adopting a healthy lifestyle and getting preventive care can go a long way to promote health and wellness. Talk with your health  care provider about what schedule of regular examinations is right for you. This is a good chance for you to check in with your provider about disease prevention and staying healthy. In between checkups, there are plenty of things you can do on your own. Experts have done a lot of research about which lifestyle changes and preventive measures are most likely to keep you healthy. Ask your health care provider for more information. Weight and diet Eat a healthy diet  Be sure to include plenty of vegetables, fruits, low-fat dairy products, and lean protein.  Do not eat a lot of foods high in solid fats, added sugars, or salt.  Get regular exercise. This is one of the most important things you can do for your health. ? Most adults should exercise for at least 150 minutes each week. The exercise should increase your heart rate and make you sweat (moderate-intensity exercise). ? Most adults should also do strengthening exercises at least twice a week. This is in addition to the moderate-intensity exercise.  Maintain a healthy weight  Body mass index (BMI) is a measurement that can be used to identify possible weight problems. It estimates body fat based on height and weight. Your health care provider can help determine your BMI and help you achieve or maintain a healthy weight.  For females 34 years of age and older: ? A BMI below 18.5 is considered underweight. ? A BMI of 18.5 to 24.9 is normal. ? A BMI of 25 to 29.9 is considered overweight. ? A BMI of 30 and above is considered obese.  Watch levels of cholesterol and blood lipids  You should start having your blood tested for lipids and cholesterol at 50 years of age, then have this test every 5 years.  You may need to have your cholesterol levels checked more often if: ? Your lipid or cholesterol levels are high. ? You are  older than 50 years of age. ? You are at high risk for heart disease.  Cancer screening Lung Cancer  Lung cancer  screening is recommended for adults 86-55 years old who are at high risk for lung cancer because of a history of smoking.  A yearly low-dose CT scan of the lungs is recommended for people who: ? Currently smoke. ? Have quit within the past 15 years. ? Have at least a 30-pack-year history of smoking. A pack year is smoking an average of one pack of cigarettes a day for 1 year.  Yearly screening should continue until it has been 15 years since you quit.  Yearly screening should stop if you develop a health problem that would prevent you from having lung cancer treatment.  Breast Cancer  Practice breast self-awareness. This means understanding how your breasts normally appear and feel.  It also means doing regular breast self-exams. Let your health care provider know about any changes, no matter how small.  If you are in your 20s or 30s, you should have a clinical breast exam (CBE) by a health care provider every 1-3 years as part of a regular health exam.  If you are 18 or older, have a CBE every year. Also consider having a breast X-ray (mammogram) every year.  If you have a family history of breast cancer, talk to your health care provider about genetic screening.  If you are at high risk for breast cancer, talk to your health care provider about having an MRI and a mammogram every year.  Breast cancer gene (BRCA) assessment is recommended for women who have family members with BRCA-related cancers. BRCA-related cancers include: ? Breast. ? Ovarian. ? Tubal. ? Peritoneal cancers.  Results of the assessment will determine the need for genetic counseling and BRCA1 and BRCA2 testing.  Cervical Cancer Your health care provider may recommend that you be screened regularly for cancer of the pelvic organs (ovaries, uterus, and vagina). This screening involves a pelvic examination, including checking for microscopic changes to the surface of your cervix (Pap test). You may be encouraged to  have this screening done every 3 years, beginning at age 54.  For women ages 74-65, health care providers may recommend pelvic exams and Pap testing every 3 years, or they may recommend the Pap and pelvic exam, combined with testing for human papilloma virus (HPV), every 5 years. Some types of HPV increase your risk of cervical cancer. Testing for HPV may also be done on women of any age with unclear Pap test results.  Other health care providers may not recommend any screening for nonpregnant women who are considered low risk for pelvic cancer and who do not have symptoms. Ask your health care provider if a screening pelvic exam is right for you.  If you have had past treatment for cervical cancer or a condition that could lead to cancer, you need Pap tests and screening for cancer for at least 20 years after your treatment. If Pap tests have been discontinued, your risk factors (such as having a new sexual partner) need to be reassessed to determine if screening should resume. Some women have medical problems that increase the chance of getting cervical cancer. In these cases, your health care provider may recommend more frequent screening and Pap tests.  Colorectal Cancer  This type of cancer can be detected and often prevented.  Routine colorectal cancer screening usually begins at 50 years of age and continues through 50 years of age.  Your health care provider may recommend screening at an earlier age if you have risk factors for colon cancer.  Your health care provider may also recommend using home test kits to check for hidden blood in the stool.  A small camera at the end of a tube can be used to examine your colon directly (sigmoidoscopy or colonoscopy). This is done to check for the earliest forms of colorectal cancer.  Routine screening usually begins at age 24.  Direct examination of the colon should be repeated every 5-10 years through 50 years of age. However, you may need to be  screened more often if early forms of precancerous polyps or small growths are found.  Skin Cancer  Check your skin from head to toe regularly.  Tell your health care provider about any new moles or changes in moles, especially if there is a change in a mole's shape or color.  Also tell your health care provider if you have a mole that is larger than the size of a pencil eraser.  Always use sunscreen. Apply sunscreen liberally and repeatedly throughout the day.  Protect yourself by wearing long sleeves, pants, a wide-brimmed hat, and sunglasses whenever you are outside.  Heart disease, diabetes, and high blood pressure  High blood pressure causes heart disease and increases the risk of stroke. High blood pressure is more likely to develop in: ? People who have blood pressure in the high end of the normal range (130-139/85-89 mm Hg). ? People who are overweight or obese. ? People who are African American.  If you are 74-38 years of age, have your blood pressure checked every 3-5 years. If you are 62 years of age or older, have your blood pressure checked every year. You should have your blood pressure measured twice-once when you are at a hospital or clinic, and once when you are not at a hospital or clinic. Record the average of the two measurements. To check your blood pressure when you are not at a hospital or clinic, you can use: ? An automated blood pressure machine at a pharmacy. ? A home blood pressure monitor.  If you are between 2 years and 57 years old, ask your health care provider if you should take aspirin to prevent strokes.  Have regular diabetes screenings. This involves taking a blood sample to check your fasting blood sugar level. ? If you are at a normal weight and have a low risk for diabetes, have this test once every three years after 50 years of age. ? If you are overweight and have a high risk for diabetes, consider being tested at a younger age or more  often. Preventing infection Hepatitis B  If you have a higher risk for hepatitis B, you should be screened for this virus. You are considered at high risk for hepatitis B if: ? You were born in a country where hepatitis B is common. Ask your health care provider which countries are considered high risk. ? Your parents were born in a high-risk country, and you have not been immunized against hepatitis B (hepatitis B vaccine). ? You have HIV or AIDS. ? You use needles to inject street drugs. ? You live with someone who has hepatitis B. ? You have had sex with someone who has hepatitis B. ? You get hemodialysis treatment. ? You take certain medicines for conditions, including cancer, organ transplantation, and autoimmune conditions.  Hepatitis C  Blood testing is recommended for: ? Everyone born from [birth year] through  1963. ? Anyone with known risk factors for hepatitis C.  Sexually transmitted infections (STIs)  You should be screened for sexually transmitted infections (STIs) including gonorrhea and chlamydia if: ? You are sexually active and are younger than 50 years of age. ? You are older than 50 years of age and your health care provider tells you that you are at risk for this type of infection. ? Your sexual activity has changed since you were last screened and you are at an increased risk for chlamydia or gonorrhea. Ask your health care provider if you are at risk.  If you do not have HIV, but are at risk, it may be recommended that you take a prescription medicine daily to prevent HIV infection. This is called pre-exposure prophylaxis (PrEP). You are considered at risk if: ? You are sexually active and do not regularly use condoms or know the HIV status of your partner(s). ? You take drugs by injection. ? You are sexually active with a partner who has HIV.  Talk with your health care provider about whether you are at high risk of being infected with HIV. If you choose to begin PrEP,  you should first be tested for HIV. You should then be tested every 3 months for as long as you are taking PrEP. Pregnancy  If you are premenopausal and you may become pregnant, ask your health care provider about preconception counseling.  If you may become pregnant, take 400 to 800 micrograms (mcg) of folic acid every day.  If you want to prevent pregnancy, talk to your health care provider about birth control (contraception). Osteoporosis and menopause  Osteoporosis is a disease in which the bones lose minerals and strength with aging. This can result in serious bone fractures. Your risk for osteoporosis can be identified using a bone density scan.  If you are 73 years of age or older, or if you are at risk for osteoporosis and fractures, ask your health care provider if you should be screened.  Ask your health care provider whether you should take a calcium or vitamin D supplement to lower your risk for osteoporosis.  Menopause may have certain physical symptoms and risks.  Hormone replacement therapy may reduce some of these symptoms and risks. Talk to your health care provider about whether hormone replacement therapy is right for you. Follow these instructions at home:  Schedule regular health, dental, and eye exams.  Stay current with your immunizations.  Do not use any tobacco products including cigarettes, chewing tobacco, or electronic cigarettes.  If you are pregnant, do not drink alcohol.  If you are breastfeeding, limit how much and how often you drink alcohol.  Limit alcohol intake to no more than 1 drink per day for nonpregnant women. One drink equals 12 ounces of beer, 5 ounces of wine, or 1 ounces of hard liquor.  Do not use street drugs.  Do not share needles.  Ask your health care provider for help if you need support or information about quitting drugs.  Tell your health care provider if you often feel depressed.  Tell your health care provider if you  have ever been abused or do not feel safe at home. This information is not intended to replace advice given to you by your health care provider. Make sure you discuss any questions you have with your health care provider. Document Released: 08/21/2010 Document Revised: 07/14/2015 Document Reviewed: 11/09/2014 Elsevier Interactive Patient Education  2018 [place]  KRegis Bill M.D.

## 2017-06-10 ENCOUNTER — Ambulatory Visit (INDEPENDENT_AMBULATORY_CARE_PROVIDER_SITE_OTHER): Payer: BLUE CROSS/BLUE SHIELD | Admitting: Internal Medicine

## 2017-06-10 ENCOUNTER — Encounter: Payer: Self-pay | Admitting: Internal Medicine

## 2017-06-10 VITALS — BP 98/66 | HR 68 | Temp 98.0°F | Ht 65.5 in | Wt 146.6 lb

## 2017-06-10 DIAGNOSIS — E7800 Pure hypercholesterolemia, unspecified: Secondary | ICD-10-CM

## 2017-06-10 DIAGNOSIS — Z0000 Encounter for general adult medical examination without abnormal findings: Secondary | ICD-10-CM

## 2017-06-10 DIAGNOSIS — Z79899 Other long term (current) drug therapy: Secondary | ICD-10-CM

## 2017-06-10 LAB — COMPREHENSIVE METABOLIC PANEL
ALT: 11 U/L (ref 0–35)
AST: 13 U/L (ref 0–37)
Albumin: 4.5 g/dL (ref 3.5–5.2)
Alkaline Phosphatase: 34 U/L — ABNORMAL LOW (ref 39–117)
BUN: 14 mg/dL (ref 6–23)
CO2: 28 mEq/L (ref 19–32)
Calcium: 9.6 mg/dL (ref 8.4–10.5)
Chloride: 100 mEq/L (ref 96–112)
Creatinine, Ser: 1.08 mg/dL (ref 0.40–1.20)
GFR: 57.14 mL/min — ABNORMAL LOW (ref >60.00)
Glucose, Bld: 90 mg/dL (ref 70–99)
Potassium: 4.8 mEq/L (ref 3.5–5.1)
Sodium: 137 mEq/L (ref 135–145)
Total Bilirubin: 0.4 mg/dL (ref 0.2–1.2)
Total Protein: 7 g/dL (ref 6.0–8.3)

## 2017-06-10 LAB — CBC WITH DIFFERENTIAL/PLATELET
Basophils Absolute: 0 10*3/uL (ref 0.0–0.1)
Basophils Relative: 0.3 % (ref 0.0–3.0)
Eosinophils Absolute: 0.1 10*3/uL (ref 0.0–0.7)
Eosinophils Relative: 3 % (ref 0.0–5.0)
HCT: 37.1 % (ref 36.0–46.0)
Hemoglobin: 12.6 g/dL (ref 12.0–15.0)
Lymphocytes Relative: 27.9 % (ref 12.0–46.0)
Lymphs Abs: 1.4 10*3/uL (ref 0.7–4.0)
MCHC: 33.9 g/dL (ref 30.0–36.0)
MCV: 94.6 fl (ref 78.0–100.0)
Monocytes Absolute: 0.4 10*3/uL (ref 0.1–1.0)
Monocytes Relative: 8.4 % (ref 3.0–12.0)
Neutro Abs: 2.9 10*3/uL (ref 1.4–7.7)
Neutrophils Relative %: 60.4 % (ref 43.0–77.0)
Platelets: 308 10*3/uL (ref 150.0–400.0)
RBC: 3.92 Mil/uL (ref 3.87–5.11)
RDW: 13.5 % (ref 11.5–15.5)
WBC: 4.9 10*3/uL (ref 4.0–10.5)

## 2017-06-10 LAB — TSH: TSH: 2.11 u[IU]/mL (ref 0.35–4.50)

## 2017-06-10 LAB — LIPID PANEL
Cholesterol: 183 mg/dL (ref 0–200)
HDL: 67.1 mg/dL (ref >39.00)
LDL Cholesterol: 104 mg/dL — ABNORMAL HIGH (ref 0–99)
NonHDL: 116.09
Total CHOL/HDL Ratio: 3
Triglycerides: 58 mg/dL (ref 0.0–149.0)
VLDL: 11.6 mg/dL (ref 0.0–40.0)

## 2017-06-10 NOTE — Patient Instructions (Signed)
Will notify you  of labs when available.  Continue lifestyle intervention healthy eating and exercise .  add some acitivity as discussed but get your sleep.    Health Maintenance, Female Adopting a healthy lifestyle and getting preventive care can go a long way to promote health and wellness. Talk with your health care provider about what schedule of regular examinations is right for you. This is a good chance for you to check in with your provider about disease prevention and staying healthy. In between checkups, there are plenty of things you can do on your own. Experts have done a lot of research about which lifestyle changes and preventive measures are most likely to keep you healthy. Ask your health care provider for more information. Weight and diet Eat a healthy diet  Be sure to include plenty of vegetables, fruits, low-fat dairy products, and lean protein.  Do not eat a lot of foods high in solid fats, added sugars, or salt.  Get regular exercise. This is one of the most important things you can do for your health. ? Most adults should exercise for at least 150 minutes each week. The exercise should increase your heart rate and make you sweat (moderate-intensity exercise). ? Most adults should also do strengthening exercises at least twice a week. This is in addition to the moderate-intensity exercise.  Maintain a healthy weight  Body mass index (BMI) is a measurement that can be used to identify possible weight problems. It estimates body fat based on height and weight. Your health care provider can help determine your BMI and help you achieve or maintain a healthy weight.  For females 28 years of age and older: ? A BMI below 18.5 is considered underweight. ? A BMI of 18.5 to 24.9 is normal. ? A BMI of 25 to 29.9 is considered overweight. ? A BMI of 30 and above is considered obese.  Watch levels of cholesterol and blood lipids  You should start having your blood tested for  lipids and cholesterol at 50 years of age, then have this test every 5 years.  You may need to have your cholesterol levels checked more often if: ? Your lipid or cholesterol levels are high. ? You are older than 50 years of age. ? You are at high risk for heart disease.  Cancer screening Lung Cancer  Lung cancer screening is recommended for adults 77-81 years old who are at high risk for lung cancer because of a history of smoking.  A yearly low-dose CT scan of the lungs is recommended for people who: ? Currently smoke. ? Have quit within the past 15 years. ? Have at least a 30-pack-year history of smoking. A pack year is smoking an average of one pack of cigarettes a day for 1 year.  Yearly screening should continue until it has been 15 years since you quit.  Yearly screening should stop if you develop a health problem that would prevent you from having lung cancer treatment.  Breast Cancer  Practice breast self-awareness. This means understanding how your breasts normally appear and feel.  It also means doing regular breast self-exams. Let your health care provider know about any changes, no matter how small.  If you are in your 20s or 30s, you should have a clinical breast exam (CBE) by a health care provider every 1-3 years as part of a regular health exam.  If you are 81 or older, have a CBE every year. Also consider having a breast  X-ray (mammogram) every year.  If you have a family history of breast cancer, talk to your health care provider about genetic screening.  If you are at high risk for breast cancer, talk to your health care provider about having an MRI and a mammogram every year.  Breast cancer gene (BRCA) assessment is recommended for women who have family members with BRCA-related cancers. BRCA-related cancers include: ? Breast. ? Ovarian. ? Tubal. ? Peritoneal cancers.  Results of the assessment will determine the need for genetic counseling and BRCA1 and  BRCA2 testing.  Cervical Cancer Your health care provider may recommend that you be screened regularly for cancer of the pelvic organs (ovaries, uterus, and vagina). This screening involves a pelvic examination, including checking for microscopic changes to the surface of your cervix (Pap test). You may be encouraged to have this screening done every 3 years, beginning at age 84.  For women ages 24-65, health care providers may recommend pelvic exams and Pap testing every 3 years, or they may recommend the Pap and pelvic exam, combined with testing for human papilloma virus (HPV), every 5 years. Some types of HPV increase your risk of cervical cancer. Testing for HPV may also be done on women of any age with unclear Pap test results.  Other health care providers may not recommend any screening for nonpregnant women who are considered low risk for pelvic cancer and who do not have symptoms. Ask your health care provider if a screening pelvic exam is right for you.  If you have had past treatment for cervical cancer or a condition that could lead to cancer, you need Pap tests and screening for cancer for at least 20 years after your treatment. If Pap tests have been discontinued, your risk factors (such as having a new sexual partner) need to be reassessed to determine if screening should resume. Some women have medical problems that increase the chance of getting cervical cancer. In these cases, your health care provider may recommend more frequent screening and Pap tests.  Colorectal Cancer  This type of cancer can be detected and often prevented.  Routine colorectal cancer screening usually begins at 50 years of age and continues through 50 years of age.  Your health care provider may recommend screening at an earlier age if you have risk factors for colon cancer.  Your health care provider may also recommend using home test kits to check for hidden blood in the stool.  A small camera at the  end of a tube can be used to examine your colon directly (sigmoidoscopy or colonoscopy). This is done to check for the earliest forms of colorectal cancer.  Routine screening usually begins at age 33.  Direct examination of the colon should be repeated every 5-10 years through 50 years of age. However, you may need to be screened more often if early forms of precancerous polyps or small growths are found.  Skin Cancer  Check your skin from head to toe regularly.  Tell your health care provider about any new moles or changes in moles, especially if there is a change in a mole's shape or color.  Also tell your health care provider if you have a mole that is larger than the size of a pencil eraser.  Always use sunscreen. Apply sunscreen liberally and repeatedly throughout the day.  Protect yourself by wearing long sleeves, pants, a wide-brimmed hat, and sunglasses whenever you are outside.  Heart disease, diabetes, and high blood pressure  High blood  pressure causes heart disease and increases the risk of stroke. High blood pressure is more likely to develop in: ? People who have blood pressure in the high end of the normal range (130-139/85-89 mm Hg). ? People who are overweight or obese. ? People who are African American.  If you are 67-37 years of age, have your blood pressure checked every 3-5 years. If you are 29 years of age or older, have your blood pressure checked every year. You should have your blood pressure measured twice-once when you are at a hospital or clinic, and once when you are not at a hospital or clinic. Record the average of the two measurements. To check your blood pressure when you are not at a hospital or clinic, you can use: ? An automated blood pressure machine at a pharmacy. ? A home blood pressure monitor.  If you are between 6 years and 14 years old, ask your health care provider if you should take aspirin to prevent strokes.  Have regular diabetes  screenings. This involves taking a blood sample to check your fasting blood sugar level. ? If you are at a normal weight and have a low risk for diabetes, have this test once every three years after 50 years of age. ? If you are overweight and have a high risk for diabetes, consider being tested at a younger age or more often. Preventing infection Hepatitis B  If you have a higher risk for hepatitis B, you should be screened for this virus. You are considered at high risk for hepatitis B if: ? You were born in a country where hepatitis B is common. Ask your health care provider which countries are considered high risk. ? Your parents were born in a high-risk country, and you have not been immunized against hepatitis B (hepatitis B vaccine). ? You have HIV or AIDS. ? You use needles to inject street drugs. ? You live with someone who has hepatitis B. ? You have had sex with someone who has hepatitis B. ? You get hemodialysis treatment. ? You take certain medicines for conditions, including cancer, organ transplantation, and autoimmune conditions.  Hepatitis C  Blood testing is recommended for: ? Everyone born from [birth year] through 1963. ? Anyone with known risk factors for hepatitis C.  Sexually transmitted infections (STIs)  You should be screened for sexually transmitted infections (STIs) including gonorrhea and chlamydia if: ? You are sexually active and are younger than 50 years of age. ? You are older than 50 years of age and your health care provider tells you that you are at risk for this type of infection. ? Your sexual activity has changed since you were last screened and you are at an increased risk for chlamydia or gonorrhea. Ask your health care provider if you are at risk.  If you do not have HIV, but are at risk, it may be recommended that you take a prescription medicine daily to prevent HIV infection. This is called pre-exposure prophylaxis (PrEP). You are considered at risk  if: ? You are sexually active and do not regularly use condoms or know the HIV status of your partner(s). ? You take drugs by injection. ? You are sexually active with a partner who has HIV.  Talk with your health care provider about whether you are at high risk of being infected with HIV. If you choose to begin PrEP, you should first be tested for HIV. You should then be tested every 3 months for as  long as you are taking PrEP. Pregnancy  If you are premenopausal and you may become pregnant, ask your health care provider about preconception counseling.  If you may become pregnant, take 400 to 800 micrograms (mcg) of folic acid every day.  If you want to prevent pregnancy, talk to your health care provider about birth control (contraception). Osteoporosis and menopause  Osteoporosis is a disease in which the bones lose minerals and strength with aging. This can result in serious bone fractures. Your risk for osteoporosis can be identified using a bone density scan.  If you are 53 years of age or older, or if you are at risk for osteoporosis and fractures, ask your health care provider if you should be screened.  Ask your health care provider whether you should take a calcium or vitamin D supplement to lower your risk for osteoporosis.  Menopause may have certain physical symptoms and risks.  Hormone replacement therapy may reduce some of these symptoms and risks. Talk to your health care provider about whether hormone replacement therapy is right for you. Follow these instructions at home:  Schedule regular health, dental, and eye exams.  Stay current with your immunizations.  Do not use any tobacco products including cigarettes, chewing tobacco, or electronic cigarettes.  If you are pregnant, do not drink alcohol.  If you are breastfeeding, limit how much and how often you drink alcohol.  Limit alcohol intake to no more than 1 drink per day for nonpregnant women. One drink  equals 12 ounces of beer, 5 ounces of wine, or 1 ounces of hard liquor.  Do not use street drugs.  Do not share needles.  Ask your health care provider for help if you need support or information about quitting drugs.  Tell your health care provider if you often feel depressed.  Tell your health care provider if you have ever been abused or do not feel safe at home. This information is not intended to replace advice given to you by your health care provider. Make sure you discuss any questions you have with your health care provider. Document Released: 08/21/2010 Document Revised: 07/14/2015 Document Reviewed: 11/09/2014 Elsevier Interactive Patient Education  [employer].

## 2017-06-19 ENCOUNTER — Telehealth: Payer: Self-pay | Admitting: Certified Nurse Midwife

## 2017-06-19 NOTE — Telephone Encounter (Signed)
Spoke with patient, advised as seen below per Melvia Heaps, CNM. Patient verbalizes understanding, will close encounter.

## 2017-06-19 NOTE — Telephone Encounter (Signed)
Patient is having symptoms of vaginitis and would like a prescription called in. She's going out of town this afternoon and can not come in.

## 2017-06-19 NOTE — Telephone Encounter (Signed)
She can use the Aveeno Eczema cream OTC which works well for this.

## 2017-06-19 NOTE — Telephone Encounter (Signed)
Spoke with patient. Patient requesting RX for vaginal itching around labia. Reports this occurs every spring. Denies d/c, odor, urinary symptoms, bleeding. Not SA. No new products.   Advised patient OV recommended for further evaluation. Patient declined. Reviewed options of coconut oil, OTC monistat externally,aveeno baths, vagisil. Patient states she has used these products in the past, makes symptoms worse. Patient again declined OV, is going out of town. Advised to return call to office if symptoms do not resolve. Aware provider will review, I will return call if any additional recommendations are provided.   Routing to provider for final review. Patient is agreeable to disposition. Will close encounter.

## 2017-08-05 ENCOUNTER — Ambulatory Visit: Payer: BLUE CROSS/BLUE SHIELD | Admitting: Family Medicine

## 2017-08-05 ENCOUNTER — Other Ambulatory Visit: Payer: Self-pay | Admitting: *Deleted

## 2017-08-05 ENCOUNTER — Telehealth: Payer: Self-pay | Admitting: Internal Medicine

## 2017-08-05 DIAGNOSIS — M62838 Other muscle spasm: Secondary | ICD-10-CM

## 2017-08-05 DIAGNOSIS — Z1231 Encounter for screening mammogram for malignant neoplasm of breast: Secondary | ICD-10-CM

## 2017-08-05 NOTE — Telephone Encounter (Signed)
   RE: Appointment scheduled from [place], [place], [place] To Lisa Perkins, Lisa "Tye [place]" Sent and Delivered 08/05/2017 9:46 AM    I attempted to call you to rescheduled your appointment today and reschedule you with Dr Regis Bill.  I need more information as to what is going on and what symptoms are you having. Dr Regis Bill has an opening this afternoon around 3pm but her slots do fill up quickly.  Please contact the office to discuss you symptoms and the scheduled appt. Thanks!   Varney Daily, CMA  (Dr Velora Mediate Nurse)

## 2017-08-05 NOTE — Telephone Encounter (Signed)
  Dr Regis Bill aware that the patient is coming in to see Dr Martinique today at 4:45p I called her and she was unable to see Dr Regis Bill based on her scheduling needs. She needed an appt later than 4pm today for her neck and shoulder pain to get Pt orders.  Discussed with Dr Regis Bill if this was something that she would be able to write orders for without an OV or does she want her to keep her appt with Dr Martinique as scheduled today?  Per Dr Regis Bill, does not need an OV today, can cancel unless she feels she needs to be seen.  Order can be placed for PT for her neck/shoulder muscle tension as requested -- this is an ongoing issue.   Per Dr Regis Bill "she doesnt need ov unless she wishes   as she has had this before  and ok to refer   for pt under my name"  Left a detailed message for patient making aware and requesting a call back to let us know if she wants to cancel her appt. Will wait to order PT referral when she calls back

## 2017-08-06 ENCOUNTER — Encounter: Payer: Self-pay | Admitting: Allergy and Immunology

## 2017-08-06 ENCOUNTER — Ambulatory Visit (INDEPENDENT_AMBULATORY_CARE_PROVIDER_SITE_OTHER): Payer: BLUE CROSS/BLUE SHIELD | Admitting: Allergy and Immunology

## 2017-08-06 VITALS — BP 114/64 | HR 72 | Temp 98.8°F | Resp 16 | Ht 65.25 in | Wt 147.0 lb

## 2017-08-06 DIAGNOSIS — J3089 Other allergic rhinitis: Secondary | ICD-10-CM

## 2017-08-06 DIAGNOSIS — Z8709 Personal history of other diseases of the respiratory system: Secondary | ICD-10-CM

## 2017-08-06 DIAGNOSIS — M79629 Pain in unspecified upper arm: Secondary | ICD-10-CM | POA: Diagnosis not present

## 2017-08-06 NOTE — Telephone Encounter (Signed)
Appt was cancelled with Dr Martinique  Order placed for PT  Nothing further needed.

## 2017-08-06 NOTE — Progress Notes (Signed)
Dear Dr. Regis Bill,  Thank you for referring Lisa Perkins to the [facility] on 08/06/2017.   Below is a summation of this patient's evaluation and recommendations.  Thank you for your referral. I will keep you informed about this patient's response to treatment.   If you have any questions please do not hesitate to contact me.   Sincerely,  Jiles Prows, MD Allergy / Immunology [place]   ______________________________________________________________________    NEW PATIENT NOTE  Referring Provider: Burnis Medin, MD Primary Provider: Burnis Medin, MD Date of office visit: 08/06/2017    Subjective:   Chief Complaint:  Lisa Perkins (DOB: 29-Mar-1965) is a 52 y.o. female who presents to the clinic on 08/06/2017 with a chief complaint of Pain (from diff deodorant has 10 diff ones started 6 weeks ago) .     HPI: Lisa Perkins presents to this clinic in evaluation of possible allergic reaction to underarm deodorant.  Her history dates back approximately 2 months or so when she developed a reaction under both axilla that she describes as painful and tender almost like a bruise.  There was no visual dermatitis associated with this issue.  She did not have any associated systemic or constitutional symptoms.  She was under the impression initially that this was secondary to underarm deodorant and since she changed to several different types of underarm deodorant but nothing appeared to help.  It is only over the course of the past week or so that she has resolved the issue involving her right axilla and she has intermittent issues now associated with her left axilla while utilizing a underarm deodorant containing 25% aluminum sesquintichlorohydrate.  She now has this issue 7 times per day for approximately 30 seconds per episode.  She does not really have a very significant atopic history.   She did have childhood asthma and although she has a short acting bronchodilator handy she does not have a need to use this agent in over a year or so.  She occasionally does get some itchy eyes and she needs to use Pataday occasionally and she had some sneezing this spring for which she took Nasalcrom.  As well, she thinks that consumption of shrimp is given rise to pain in her left MCP joints and as long as she remains away from consuming shrimp she does not have any of this issue.  Past Medical History:  Diagnosis Date  . Abnormal Pap smear of cervix    just a repeat done  . ALLERGIC RHINITIS 11/07/2007  . Asthma as child  . BENIGN NEOPLASM OF SKIN SITE UNSPECIFIED 07/25/2009  . DEPRESSION 11/07/2007  . Eczema   . GERD 11/07/2007   LPR - laryngopharyngeal reflux  . 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 TONSILLECTOMY AND ADENOIDECTOMY    . WISDOM TOOTH EXTRACTION      Allergies as of 08/06/2017      Reactions   Amoxicillin    REACTION: nausea  gi signs   Erythromycin       Medication List      citalopram 20 MG tablet Commonly known as:  CELEXA Take 30 mg by mouth daily.   clonazePAM 0.5 MG tablet Commonly known as:  KLONOPIN Take 0.5 mg by mouth at bedtime as needed.   FLINSTONES GUMMIES OMEGA-3 DHA PO Take by mouth.   ibuprofen  200 MG tablet Commonly known as:  ADVIL,MOTRIN Take 200 mg by mouth every 6 (six) hours as needed.   lamoTRIgine 150 MG tablet Commonly known as:  LAMICTAL Take 1 tablet by mouth daily.   lansoprazole 30 MG capsule Commonly known as:  PREVACID   olopatadine 0.1 % ophthalmic solution Commonly known as:  PATANOL PLACE 1 DROP INTO BOTH EYES 2 (TWO) TIMES DAILY.   polyethylene glycol packet Commonly known as:  MIRALAX / GLYCOLAX Take 17 g by mouth daily.   VENTOLIN HFA 108 (90 Base) MCG/ACT inhaler Generic drug:  albuterol Inhale into the lungs every 6 (six) hours as needed for wheezing or  shortness of breath.       Review of systems negative except as noted in HPI / PMHx or noted below:  Review of Systems  Constitutional: Negative.   HENT: Negative.   Eyes: Negative.   Respiratory: Negative.   Cardiovascular: Negative.   Gastrointestinal: Negative.   Genitourinary: Negative.   Musculoskeletal: Negative.   Skin: Negative.   Neurological: Negative.   Endo/Heme/Allergies: Negative.   Psychiatric/Behavioral: Negative.     Family History  Problem Relation Age of Onset  . Endometriosis Mother   . Hypertension Mother   . Allergic rhinitis Mother   . Food Allergy Mother        shrimp, egg white  . Diabetes Maternal Grandmother   . Hypertension Maternal Grandmother   . Stroke Maternal Grandmother   . COPD Father   . Dementia Father   . Allergic rhinitis Father   . Asthma Father   . Hypertension Maternal Grandfather   . Stroke Maternal Grandfather   . Stroke Paternal Grandmother   . Stroke Paternal Grandfather   . Diabetes Maternal Uncle   . Breast cancer Paternal Aunt 71       bilateral mastectomy - survivor  . Angioedema Neg Hx   . Eczema Neg Hx   . Urticaria Neg Hx     Social History   Socioeconomic History  . Marital status: Married    Spouse name: Not on file  . Number of children: Not on file  . Years of education: Not on file  . Highest education level: Not on file  Occupational History  . Not on file  Social Needs  . Financial resource strain: Not on file  . Food insecurity:    Worry: Not on file    Inability: Not on file  . Transportation needs:    Medical: Not on file    Non-medical: Not on file  Tobacco Use  . Smoking status: Never Smoker  . Smokeless tobacco: Never Used  Substance and Sexual Activity  . Alcohol use: Yes    Alcohol/week: 0.0 oz    Comment: 1 drink per month  . Drug use: No  . Sexual activity: Not Currently    Partners: Male    Comment: husband vasectomy  Lifestyle  . Physical activity:    Days per week:  Not on file    Minutes per session: Not on file  . Stress: Not on file  Relationships  . Social connections:    Talks on phone: Not on file    Gets together: Not on file    Attends religious service: Not on file    Active member of club or organization: Not on file    Attends meetings of clubs or organizations: Not on file    Relationship status: Not on file  . Intimate partner violence:  Fear of current or ex partner: Not on file    Emotionally abused: Not on file    Physically abused: Not on file    Forced sexual activity: Not on file  Other Topics Concern  . 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    Non smoker     Environmental and Social history  Lives in a house with a dry environment, a dog located inside the household, carpet in the bedroom, no plastic on the bed, no plastic on the pillow, no smokers located inside the household.  She is a Psychologist, clinical.  Objective:   Vitals:   08/06/17 0823  BP: 114/64  Perkins: 72  Resp: 16  Temp: 98.8 F (37.1 C)   Height: 5' 5.25" (165.7 cm) Weight: 147 lb (66.7 kg)  Physical Exam  HENT:  Head: Normocephalic. Head is without right periorbital erythema and without left periorbital erythema.  Right Ear: Tympanic membrane, external ear and ear canal normal.  Left Ear: Tympanic membrane, external ear and ear canal normal.  Nose: Nose normal. No mucosal edema or rhinorrhea.  Mouth/Throat: Oropharynx is clear and moist and mucous membranes are normal. No oropharyngeal exudate.  Eyes: Pupils are equal, round, and reactive to light. Conjunctivae and lids are normal.  Neck: Trachea normal. No tracheal deviation present. No thyromegaly present.  Cardiovascular: Normal rate, regular rhythm, S1 normal, S2 normal and normal heart sounds.  No murmur heard. Pulmonary/Chest: Effort normal. No stridor. No respiratory distress. She has no wheezes. She has no rales. She exhibits  no tenderness.  Abdominal: Soft. She exhibits no distension and no mass. There is no hepatosplenomegaly. There is no tenderness. There is no rebound and no guarding.  Musculoskeletal: She exhibits no edema or tenderness.  Lymphadenopathy:       Head (right side): No tonsillar adenopathy present.       Head (left side): No tonsillar adenopathy present.    She has no cervical adenopathy.    She has no axillary adenopathy.  Neurological: She is alert.  Skin: No rash noted. She is not diaphoretic. No erythema. No pallor. Nails show no clubbing.    Diagnostics: Allergy skin tests were not performed.   Assessment and Plan:    1. Pain in axilla, unspecified laterality   2. Other allergic rhinitis   3. History of asthma     It is not entirely clear why Lisa Perkins has axilla pain.  I could not really detect any significant dermatitis or lymphadenopathy or other palpable abnormality in her axilla.  It is possible that she is having some type of contact reaction to her deodorant but this would be a very unique reaction unlike what is usually seen with a contact dermatosis.  Because she is improving we will hold off on any further evaluation at this point in time and assume that she will resolve this issue over the course of the next 4 weeks.  I have asked her to continue on her current deodorant and not to alter anything else in her life at this point and to contact me if she hits a plateau of improvement or she develops other associated problems with this issue.  Her allergic rhinitis and history of asthma do not require any further evaluation in this clinic at this point.  Jiles Prows, MD Allergy / Immunology [facility]

## 2017-08-07 ENCOUNTER — Encounter: Payer: Self-pay | Admitting: Allergy and Immunology

## 2017-08-08 ENCOUNTER — Ambulatory Visit: Payer: BLUE CROSS/BLUE SHIELD | Attending: Internal Medicine | Admitting: Physical Therapy

## 2017-08-08 DIAGNOSIS — M542 Cervicalgia: Secondary | ICD-10-CM | POA: Diagnosis present

## 2017-08-08 DIAGNOSIS — G8929 Other chronic pain: Secondary | ICD-10-CM | POA: Diagnosis present

## 2017-08-08 DIAGNOSIS — M545 Low back pain: Secondary | ICD-10-CM | POA: Insufficient documentation

## 2017-08-08 DIAGNOSIS — M62830 Muscle spasm of back: Secondary | ICD-10-CM | POA: Diagnosis present

## 2017-08-08 DIAGNOSIS — M62838 Other muscle spasm: Secondary | ICD-10-CM | POA: Diagnosis present

## 2017-08-08 NOTE — Patient Instructions (Signed)
Access Code: [email or web address]  URL: [email or web address]  Date: 08/08/2017  Prepared by: Lovett Calender   Exercises  Seated Scapular Retraction - 10 reps - 1 sets - 5 sec hold - 3x daily - 7x weekly  Single Arm Doorway Pec Stretch at 90 Degrees Abduction - 3 reps - 1 sets - 30 sec hold - 1x daily - 7x weekly

## 2017-08-09 NOTE — Therapy (Signed)
[facility] [address], [place], [place], [address] Phone: [phone]   Fax:  [phone]  Physical Therapy Evaluation  Patient Details  Name: Lisa Perkins MRN: [id number] Date of Birth: 1965/04/20 Referring Provider: Burnis Medin, MD    Encounter Date: 08/08/2017  PT End of Session - 08/08/17 0856    Visit Number  1    Date for PT Re-Evaluation  09/19/17    PT Start Time  0801    PT Stop Time  0842    PT Time Calculation (min)  41 min    Activity Tolerance  Patient tolerated treatment well    Behavior During Therapy  [facility] for tasks assessed/performed       Past Medical History:  Diagnosis Date  . Abnormal Pap smear of cervix    just a repeat done  . ALLERGIC RHINITIS 11/07/2007  . Asthma as child  . BENIGN NEOPLASM OF SKIN SITE UNSPECIFIED 07/25/2009  . DEPRESSION 11/07/2007  . Eczema   . GERD 11/07/2007   LPR - laryngopharyngeal reflux  . 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 TONSILLECTOMY AND ADENOIDECTOMY    . WISDOM TOOTH EXTRACTION      There were no vitals filed for this visit.   Subjective Assessment - 08/08/17 0804    Subjective  Upper back, neck, and lower back are all painful.  Low back is the most painful up to an 8-10 when bending over. Lower back x-ray show coccyx is in extended position.    Limitations  Walking;Other (comment);House hold activities driving, self care such as shaving    Patient Stated Goals  neck, shoulders, and upper back is the worst    Currently in Pain?  Yes    Pain Score  3     Pain Location  Neck    Pain Orientation  Right;Left;Mid;Lower usually right is worse, but left today    Pain Descriptors / Indicators  Tingling;Tightness;Aching;Spasm    Pain Type  Chronic pain    Pain Radiating Towards  up to top of the head, down into shoulders and upper back    Pain Onset  More than a month ago    Pain Frequency   Intermittent    Aggravating Factors   turning head         [facility] PT Assessment - 08/09/17 0001      Assessment   Medical Diagnosis  M62.838 (ICD-10-CM) - Muscle spasms of neck    Referring Provider  Burnis Medin, MD     Onset Date/Surgical Date  -- flare up 2-3 months ago    Prior Therapy  Yes      Precautions   Precautions  None      Restrictions   Weight Bearing Restrictions  No      Balance Screen   Has the patient fallen in the past 6 months  No      [place] residence    Living Arrangements  Spouse/significant other      Prior Function   Level of Independence  Independent    Vocation  Full time employment    Vocation Requirements  [place]   Overall Cognitive Status  Within Functional Limits for tasks assessed      Observation/Other Assessments   Focus on Therapeutic Outcomes (FOTO)   40% limited  AROM   Cervical Flexion  70    Cervical Extension  55    Cervical - Right Side Bend  45    Cervical - Left Side Bend  30    Cervical - Right Rotation  30 pain in lower cervical on left    Cervical - Left Rotation  45 pain in lower cervical on left    Lumbar Flexion  80%    Lumbar Extension  80%    Lumbar - Right Side Bend  WNL    Lumbar - Left Side Bend  WNL    Lumbar - Right Rotation  WNL    Lumbar - Left Rotation  WNL      Strength   Overall Strength Comments  left shoulder flexion and abduction 4/5      Palpation   Palpation comment  Tight and tenderness of thoracic paraspinals, rhomboids, upper traps, levator scapula, and cervical musculature including Cervical paraspinals, SCM, suboccipitals, and scalenes       Special Tests    Special Tests  Cervical    Cervical Tests  Spurling's;Dictraction      Spurling's   Findings  Negative      Distraction Test   Findngs  Positive    Comment  decreased pain with distraction      Ambulation/Gait   Gait Pattern  Within Functional  Limits                Objective measurements completed on examination: See above findings.      [facility] Adult PT Treatment/Exercise - 08/09/17 0001      Self-Care   Self-Care  Other Self-Care Comments    Other Self-Care Comments   educated on HEP      Manual Therapy   Manual Therapy  Soft tissue mobilization;Myofascial release    Soft tissue mobilization  suboccipitals    Myofascial Release  suboccipitals             PT Education - 08/08/17 0857    Education Details   Access Code: [email or web address]     Person(s) Educated  Patient    Methods  Explanation;Demonstration;Handout;Verbal cues    Comprehension  Verbalized understanding;Returned demonstration       PT Short Term Goals - 08/08/17 0858      PT SHORT TERM GOAL #1   Title  pt will be ind with initial HEP    Time  3    Period  Weeks    Status  New    Target Date  08/29/17      PT SHORT TERM GOAL #2   Title  The patient will have improved cervical sidebending to 40 degrees and rotation bilaterally with minimal increase in pain    Time  3    Period  Weeks    Status  New    Target Date  08/29/17      PT SHORT TERM GOAL #3   Title  The patient will report a 25% improvement in pain with using the computer at work    Time  3    Period  Weeks    Status  New    Target Date  08/29/17        PT Long Term Goals - 08/08/17 0900      PT LONG TERM GOAL #1   Title  The patient will be independent in safe self progression of HEP for further gains in ROM and strength      Time  6  Period  Weeks    Status  New    Target Date  09/19/17      PT LONG TERM GOAL #2   Title  The patient will report a 50% or greater reduction in symptoms when driving     Time  6    Period  Weeks    Status  New    Target Date  09/19/17      PT LONG TERM GOAL #3   Title  The patient will have deep cervical muscle endurance needed for maintaining better posture while working and driving for at least one hour at a time.    Time   6    Period  Weeks    Status  New    Target Date  09/19/17      PT LONG TERM GOAL #4   Title  The patient will have improved scapular mobility in order to reduce strain on neck when managing her dog    Baseline  unable to lift scapula away from ribcage    Time  6    Period  Weeks    Status  New    Target Date  09/19/17      PT LONG TERM GOAL #5   Title  FOTO functional outcome score improved to 35% or less, indicating decreased pain and improved function    Time  6    Period  Weeks    Status  New    Target Date  09/19/17             Plan - 08/08/17 0906    Clinical Impression Statement  Pt presents to skilled PT due to increased neck, back and shoulder pain.  Pt has chronic pain and muscle spasms that she manages but has recently experienced a flare of her symptoms and it has not been improving for 2-3 months now.  Pt demonstrates posture abnormalities such as increaased thoracic kyphosos and right rotation of thoracic vertebrea T7-10.  Pt has muscle spasms throughout upper back including thoracic paraspinals, rhomboids, upper traps, levator scapula, and cervical musculature.  Cervical paraspinals, SCM, suboccipitals, and scalenes all tight.  Pt has decreased cervical ROM with pain during rotation.  Pt has some weakness of left shoulder.  Shoulde and lumbar AROM is WNL.  Pt will benefit from skilled PT to address impairments for greater function at work and during recreational activities.    History and Personal Factors relevant to plan of care:  chronic pain    Clinical Presentation  Evolving    Clinical Presentation due to:  pt has recently experienced worsening of symptoms    Clinical Decision Making  Moderate    Rehab Potential  Excellent    PT Frequency  2x / week    PT Duration  6 weeks    PT Treatment/Interventions  ADLs/Self Care Home Management;Biofeedback;Cryotherapy;Electrical Stimulation;Moist Heat;Ultrasound;Therapeutic activities;Therapeutic exercise;Neuromuscular  re-education;Patient/family education;Manual techniques;Passive range of motion;Taping    PT Next Visit Plan  taping to upper traps, STM and myofascial to cervical and scapular muscles, thoracic extension and rotation    PT Home Exercise Plan  Access Code: [email or web address]    Recommended Other Services  eval 6/201/9    Consulted and Agree with Plan of Care  Patient       Patient will benefit from skilled therapeutic intervention in order to improve the following deficits and impairments:  Pain, Increased fascial restricitons, Increased muscle spasms, Postural dysfunction, Decreased strength, Decreased range of motion  Visit Diagnosis: Muscle  spasm of back - Plan: PT plan of care cert/re-cert  Other muscle spasm - Plan: PT plan of care cert/re-cert  Cervicalgia - Plan: PT plan of care cert/re-cert  Chronic low back pain, unspecified back pain laterality, with sciatica presence unspecified - Plan: PT plan of care cert/re-cert     Problem List Patient Active Problem List   Diagnosis Date Noted  . Medication management 06/10/2017  . Anxiety 01/31/2017  . Bipolar disorder ([place]) 01/31/2017  . Depressive disorder 01/31/2017  . Iron deficiency anemia 05/11/2015  . Allergic sinusitis 06/05/2013  . Muscle spasms of neck trapezious 04/22/2012  . GERD (gastroesophageal reflux disease) 04/22/2012  . Drug interaction 04/22/2012  . BENIGN NEOPLASM OF SKIN SITE UNSPECIFIED 07/25/2009  . DEPRESSION 11/07/2007  . ALLERGIC RHINITIS 11/07/2007  . 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 GERD 11/07/2007    Zannie Cove, PT 08/09/2017, 7:55 AM  [facility] [address], [place], [place], [address] Phone: [phone]   Fax:  [phone]  Name: JAQUASIA DOSCHER MRN: [id number] Date of Birth: 05-27-1965

## 2017-08-13 ENCOUNTER — Ambulatory Visit: Payer: BLUE CROSS/BLUE SHIELD | Admitting: Physical Therapy

## 2017-08-13 ENCOUNTER — Encounter: Payer: Self-pay | Admitting: Physical Therapy

## 2017-08-13 DIAGNOSIS — M62830 Muscle spasm of back: Secondary | ICD-10-CM | POA: Diagnosis not present

## 2017-08-13 DIAGNOSIS — M542 Cervicalgia: Secondary | ICD-10-CM

## 2017-08-13 DIAGNOSIS — M62838 Other muscle spasm: Secondary | ICD-10-CM

## 2017-08-13 DIAGNOSIS — M545 Low back pain: Secondary | ICD-10-CM

## 2017-08-13 DIAGNOSIS — G8929 Other chronic pain: Secondary | ICD-10-CM

## 2017-08-13 NOTE — Patient Instructions (Signed)
Access Code: [email or web address]  URL: [email or web address]  Date: 08/13/2017  Prepared by: Elly Modena   Exercises  Seated Scapular Retraction - 10 reps - 1 sets - 5 sec hold - 3x daily - 7x weekly  Single Arm Doorway Pec Stretch at 90 Degrees Abduction - 3 reps - 1 sets - 30 sec hold - 1x daily - 7x weekly  Sidelying Open Book Thoracic Rotation with Knee on Foam Roll - 15 reps - 2x daily - 7x weekly    [facility] [address], [place], [place] [address] Phone # [phone] Fax [phone]

## 2017-08-13 NOTE — Therapy (Signed)
[facility] [address], [place], [place], [address] Phone: [phone]   Fax:  [phone]  Physical Therapy Treatment  Patient Details  Name: Lisa Perkins MRN: [id number] Date of Birth: 08-13-1965 Referring Provider: Burnis Medin, MD    Encounter Date: 08/13/2017  PT End of Session - 08/13/17 0923    Visit Number  2    Date for PT Re-Evaluation  09/19/17    PT Start Time  0802    PT Stop Time  3474    PT Time Calculation (min)  42 min    Activity Tolerance  Patient tolerated treatment well;No increased pain    Behavior During Therapy  WFL for tasks assessed/performed       Past Medical History:  Diagnosis Date  . Abnormal Pap smear of cervix    just a repeat done  . ALLERGIC RHINITIS 11/07/2007  . Asthma as child  . BENIGN NEOPLASM OF SKIN SITE UNSPECIFIED 07/25/2009  . DEPRESSION 11/07/2007  . Eczema   . GERD 11/07/2007   LPR - laryngopharyngeal reflux  . 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 TONSILLECTOMY AND ADENOIDECTOMY    . WISDOM TOOTH EXTRACTION      There were no vitals filed for this visit.  Subjective Assessment - 08/13/17 0805    Subjective  Pt notes that she does not have any pain this morning, but she does have "pins and needles" from her upper trap to the bottom of the chin.    Limitations  Walking;Other (comment);House hold activities driving, self care such as shaving    Patient Stated Goals  neck, shoulders, and upper back is the worst    Currently in Pain?  No/denies    Pain Onset  More than a month ago                       [facility] Adult PT Treatment/Exercise - 08/13/17 0001      Self-Care   Self-Care  Posture;Other Self-Care Comments    Posture  lumbar roll    Other Self-Care Comments   pillow set up at home       Exercises   Exercises  Neck      Neck Exercises: Machines for Strengthening   UBE (Upper Arm Bike)   x2 min forward, x2 min backwards L1, PT present to discuss session      Neck Exercises: Supine   Other Supine Exercise  scap retraction over foam roll x10 reps; snow angels with BUE over foam roll x15 reps     Other Supine Exercise  sub occipital release with tennis ball x3 min      Manual Therapy   Manual Therapy  Taping    Myofascial Release  Trigger point release Rt upper trap and levator scap    Kinesiotex  Inhibit Muscle      Kinesiotix   Inhibit Muscle   Rt upper trap "I" strip             PT Education - 08/13/17 0844    Education Details  updated HEP; use of lumbar roll when sitting to improve alignment; kinesiotape after care and signs of irritation    Person(s) Educated  Patient    Methods  Explanation;Handout;Verbal cues    Comprehension  Verbalized understanding;Returned demonstration       PT Short Term Goals - 08/08/17 2595      PT  SHORT TERM GOAL #1   Title  pt will be ind with initial HEP    Time  3    Period  Weeks    Status  New    Target Date  08/29/17      PT SHORT TERM GOAL #2   Title  The patient will have improved cervical sidebending to 40 degrees and rotation bilaterally with minimal increase in pain    Time  3    Period  Weeks    Status  New    Target Date  08/29/17      PT SHORT TERM GOAL #3   Title  The patient will report a 25% improvement in pain with using the computer at work    Time  3    Period  Weeks    Status  New    Target Date  08/29/17        PT Long Term Goals - 08/08/17 0900      PT LONG TERM GOAL #1   Title  The patient will be independent in safe self progression of HEP for further gains in ROM and strength      Time  6    Period  Weeks    Status  New    Target Date  09/19/17      PT LONG TERM GOAL #2   Title  The patient will report a 50% or greater reduction in symptoms when driving     Time  6    Period  Weeks    Status  New    Target Date  09/19/17      PT LONG TERM GOAL #3   Title  The patient  will have deep cervical muscle endurance needed for maintaining better posture while working and driving for at least one hour at a time.    Time  6    Period  Weeks    Status  New    Target Date  09/19/17      PT LONG TERM GOAL #4   Title  The patient will have improved scapular mobility in order to reduce strain on neck when managing her dog    Baseline  unable to lift scapula away from ribcage    Time  6    Period  Weeks    Status  New    Target Date  09/19/17      PT LONG TERM GOAL #5   Title  FOTO functional outcome score improved to 35% or less, indicating decreased pain and improved function    Time  6    Period  Weeks    Status  New    Target Date  09/19/17            Plan - 08/13/17 0843    Clinical Impression Statement  Pt arrived with reports of numbness and tingling along the Rt upper trap region. Therapist made some adjustments to pt's HEP, incorporating the foam roll, and discussed posture adjustments while sitting in the car. Completed manual trigger point release to the Rt upper trap with multiple twitch responses noted. Also applied kinesiotape to inhibit the upper trap. End of session pt reported resolved numbness and tingling. Will continue with current POC.    Rehab Potential  Excellent    PT Frequency  2x / week    PT Duration  6 weeks    PT Treatment/Interventions  ADLs/Self Care Home Management;Biofeedback;Cryotherapy;Electrical Stimulation;Moist Heat;Ultrasound;Therapeutic activities;Therapeutic exercise;Neuromuscular re-education;Patient/family education;Manual techniques;Passive range  of motion;Taping    PT Next Visit Plan  f/u on taping and use of lumbar roll, STM and myofascial to cervical and scapular muscles, thoracic extension and rotation, posture strengthening    PT Home Exercise Plan  Access Code: [email or web address]    Consulted and Agree with Plan of Care  Patient       Patient will benefit from skilled therapeutic intervention in order to improve the  following deficits and impairments:  Pain, Increased fascial restricitons, Increased muscle spasms, Postural dysfunction, Decreased strength, Decreased range of motion  Visit Diagnosis: Muscle spasm of back  Other muscle spasm  Cervicalgia  Chronic low back pain, unspecified back pain laterality, with sciatica presence unspecified     Problem List Patient Active Problem List   Diagnosis Date Noted  . Medication management 06/10/2017  . Anxiety 01/31/2017  . Bipolar disorder ([place]) 01/31/2017  . Depressive disorder 01/31/2017  . Iron deficiency anemia 05/11/2015  . Allergic sinusitis 06/05/2013  . Muscle spasms of neck trapezious 04/22/2012  . GERD (gastroesophageal reflux disease) 04/22/2012  . Drug interaction 04/22/2012  . BENIGN NEOPLASM OF SKIN SITE UNSPECIFIED 07/25/2009  . DEPRESSION 11/07/2007  . ALLERGIC RHINITIS 11/07/2007  . 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 GERD 11/07/2007   9:29 AM,08/13/17 Sherol Dade PT, DPT [place] [facility] [address] [place], [place], [address] Phone: [phone]   Fax:  [phone]  Name: Lisa Perkins MRN: [id number] Date of Birth: 1965/07/25

## 2017-08-16 ENCOUNTER — Ambulatory Visit: Payer: BLUE CROSS/BLUE SHIELD | Admitting: Physical Therapy

## 2017-08-16 ENCOUNTER — Encounter: Payer: Self-pay | Admitting: Physical Therapy

## 2017-08-16 DIAGNOSIS — M62830 Muscle spasm of back: Secondary | ICD-10-CM | POA: Diagnosis not present

## 2017-08-16 DIAGNOSIS — M62838 Other muscle spasm: Secondary | ICD-10-CM

## 2017-08-16 DIAGNOSIS — M542 Cervicalgia: Secondary | ICD-10-CM

## 2017-08-16 NOTE — Therapy (Signed)
[facility] [address] [place], [place], [address] Phone: [phone]   Fax:  [phone]  Physical Therapy Treatment  Patient Details  Name: Lisa Perkins MRN: [id number] Date of Birth: 10-28-1965 Referring Provider: Burnis Medin, MD    Encounter Date: 08/16/2017  PT End of Session - 08/16/17 0842    Visit Number  3    Date for PT Re-Evaluation  09/19/17    PT Start Time  0801    PT Stop Time  0841    PT Time Calculation (min)  40 min    Activity Tolerance  Patient tolerated treatment well;No increased pain    Behavior During Therapy  WFL for tasks assessed/performed       Past Medical History:  Diagnosis Date  . Abnormal Pap smear of cervix    just a repeat done  . ALLERGIC RHINITIS 11/07/2007  . Asthma as child  . BENIGN NEOPLASM OF SKIN SITE UNSPECIFIED 07/25/2009  . DEPRESSION 11/07/2007  . Eczema   . GERD 11/07/2007   LPR - laryngopharyngeal reflux  . 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 TONSILLECTOMY AND ADENOIDECTOMY    . WISDOM TOOTH EXTRACTION      There were no vitals filed for this visit.  Subjective Assessment - 08/16/17 0805    Subjective  Pt reports that she was able to tape her neck but this is currently irritating her skin. Just mild pain on the Lt side of her neck today.    Limitations  Walking;Other (comment);House hold activities driving, self care such as shaving    Patient Stated Goals  neck, shoulders, and upper back is the worst    Currently in Pain?  Yes    Pain Score  2     Pain Location  Neck    Pain Orientation  Left;Lower    Pain Type  Chronic pain    Pain Radiating Towards  none     Pain Onset  More than a month ago    Pain Frequency  Intermittent    Aggravating Factors   turning head                       [facility] Adult PT Treatment/Exercise - 08/16/17 0001      Self-Care   Self-Care  Posture    Other  Self-Care Comments   typical posture and spine curvature      Neck Exercises: Machines for Strengthening   UBE (Upper Arm Bike)  x2 min forward/backward       Neck Exercises: Supine   Shoulder ABduction  15 reps;Both;Limitations    Shoulder Abduction Limitations  horizontal abduction     Other Supine Exercise  shoulder D2 flexion Lt and Rt x15 reps each with green TB     Other Supine Exercise  shoulder ER with towel squeeze x15 reps using green TB       Manual Therapy   Myofascial Release  trigger point release Lt levator scap, Lt and Rt upper trap, Rt inferior 1st rib mobilization 2x30 sec              PT Education - 08/16/17 0841    Education Details  updates to HEP    Person(s) Educated  Patient    Methods  Handout;Explanation;Verbal cues    Comprehension  Returned demonstration;Verbalized understanding       PT Short Term Goals - 08/16/17  0844      PT SHORT TERM GOAL #1   Title  pt will be ind with initial HEP    Time  3    Period  Weeks    Status  Achieved      PT SHORT TERM GOAL #2   Title  The patient will have improved cervical sidebending to 40 degrees and rotation bilaterally with minimal increase in pain    Time  3    Period  Weeks    Status  On-going      PT SHORT TERM GOAL #3   Title  The patient will report a 25% improvement in pain with using the computer at work    Time  3    Period  Weeks    Status  New        PT Long Term Goals - 08/08/17 0900      PT LONG TERM GOAL #1   Title  The patient will be independent in safe self progression of HEP for further gains in ROM and strength      Time  6    Period  Weeks    Status  New    Target Date  09/19/17      PT LONG TERM GOAL #2   Title  The patient will report a 50% or greater reduction in symptoms when driving     Time  6    Period  Weeks    Status  New    Target Date  09/19/17      PT LONG TERM GOAL #3   Title  The patient will have deep cervical muscle endurance needed for  maintaining better posture while working and driving for at least one hour at a time.    Time  6    Period  Weeks    Status  New    Target Date  09/19/17      PT LONG TERM GOAL #4   Title  The patient will have improved scapular mobility in order to reduce strain on neck when managing her dog    Baseline  unable to lift scapula away from ribcage    Time  6    Period  Weeks    Status  New    Target Date  09/19/17      PT LONG TERM GOAL #5   Title  FOTO functional outcome score improved to 35% or less, indicating decreased pain and improved function    Time  6    Period  Weeks    Status  New    Target Date  09/19/17            Plan - 08/16/17 6063    Clinical Impression Statement  Pt has been consistently working on her HEP at home and using the tennis balls to address muscle spasm and tightness. Noted no pain on the Rt side of the neck, however there was some discomfort with palpable trigger points along the Lt levator scap and upper trap. Completed manual trigger point release with decrease in tissue restriction following this. Therapist made some updates to her HEP with demonstrated understanding. Will continue with current POC.     Rehab Potential  Excellent    PT Frequency  2x / week    PT Duration  6 weeks    PT Treatment/Interventions  ADLs/Self Care Home Management;Biofeedback;Cryotherapy;Electrical Stimulation;Moist Heat;Ultrasound;Therapeutic activities;Therapeutic exercise;Neuromuscular re-education;Patient/family education;Manual techniques;Passive range of motion;Taping    PT Next Visit Plan  STM and myofascial to cervical and scapular muscles, thoracic extension and rotation, posture strengthening    PT Home Exercise Plan  Access Code: [email or web address]    Consulted and Agree with Plan of Care  Patient       Patient will benefit from skilled therapeutic intervention in order to improve the following deficits and impairments:  Pain, Increased fascial restricitons, Increased  muscle spasms, Postural dysfunction, Decreased strength, Decreased range of motion  Visit Diagnosis: Muscle spasm of back  Other muscle spasm  Cervicalgia     Problem List Patient Active Problem List   Diagnosis Date Noted  . Medication management 06/10/2017  . Anxiety 01/31/2017  . Bipolar disorder ([place]) 01/31/2017  . Depressive disorder 01/31/2017  . Iron deficiency anemia 05/11/2015  . Allergic sinusitis 06/05/2013  . Muscle spasms of neck trapezious 04/22/2012  . GERD (gastroesophageal reflux disease) 04/22/2012  . Drug interaction 04/22/2012  . BENIGN NEOPLASM OF SKIN SITE UNSPECIFIED 07/25/2009  . DEPRESSION 11/07/2007  . ALLERGIC RHINITIS 11/07/2007  . 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 GERD 11/07/2007   8:46 AM,08/16/17 Sherol Dade PT, DPT [place] [facility] [address], [place], [place], [address] Phone: [phone]   Fax:  [phone]  Name: Lisa Perkins MRN: [id number] Date of Birth: [birth year]

## 2017-08-20 ENCOUNTER — Encounter: Payer: Self-pay | Admitting: Physical Therapy

## 2017-08-20 ENCOUNTER — Ambulatory Visit: Payer: BLUE CROSS/BLUE SHIELD | Attending: Internal Medicine | Admitting: Physical Therapy

## 2017-08-20 DIAGNOSIS — M545 Low back pain: Secondary | ICD-10-CM | POA: Insufficient documentation

## 2017-08-20 DIAGNOSIS — M62830 Muscle spasm of back: Secondary | ICD-10-CM | POA: Insufficient documentation

## 2017-08-20 DIAGNOSIS — M542 Cervicalgia: Secondary | ICD-10-CM | POA: Diagnosis present

## 2017-08-20 DIAGNOSIS — G8929 Other chronic pain: Secondary | ICD-10-CM | POA: Diagnosis present

## 2017-08-20 DIAGNOSIS — M62838 Other muscle spasm: Secondary | ICD-10-CM

## 2017-08-20 NOTE — Therapy (Signed)
[facility] [address] [place], [place], [address] Phone: [phone]   Fax:  [phone]  Physical Therapy Treatment  Patient Details  Name: Lisa Perkins MRN: [id number] Date of Birth: 05-16-1965 Referring Provider: Burnis Medin, MD    Encounter Date: 08/20/2017  PT End of Session - 08/20/17 0844    Visit Number  4    Date for PT Re-Evaluation  09/19/17    PT Start Time  0800    PT Stop Time  0843    PT Time Calculation (min)  43 min    Activity Tolerance  Patient tolerated treatment well;No increased pain    Behavior During Therapy  WFL for tasks assessed/performed       Past Medical History:  Diagnosis Date  . Abnormal Pap smear of cervix    just a repeat done  . ALLERGIC RHINITIS 11/07/2007  . Asthma as child  . BENIGN NEOPLASM OF SKIN SITE UNSPECIFIED 07/25/2009  . DEPRESSION 11/07/2007  . Eczema   . GERD 11/07/2007   LPR - laryngopharyngeal reflux  . 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 TONSILLECTOMY AND ADENOIDECTOMY    . WISDOM TOOTH EXTRACTION      There were no vitals filed for this visit.  Subjective Assessment - 08/20/17 0804    Subjective  Pt reports that things are going ok. She thinks the tape is bothering her some, but she doesn't have any redness when she removes it.   (Pended)     Limitations  Walking;Other (comment);House hold activities  (Pended)  driving, self care such as shaving    Patient Stated Goals  neck, shoulders, and upper back is the worst  (Pended)     Currently in Pain?  Yes  (Pended)     Pain Score  2   (Pended)     Pain Location  Neck  (Pended)     Pain Orientation  Left;Right  (Pended)     Pain Descriptors / Indicators  Aching;Tightness  (Pended)     Pain Type  Chronic pain  (Pended)     Pain Radiating Towards  none   (Pended)     Pain Onset  More than a month ago  (Pended)     Pain Frequency  Intermittent  (Pended)      Aggravating Factors   turning head  (Pended)                     UBE x2 min forward/backward L3    [facility] Adult PT Treatment/Exercise - 08/20/17 0001      Neck Exercises: Seated   Other Seated Exercise  self trigger point release with thera-cane      Neck Exercises: Sidelying   Other Sidelying Exercise  quadruped threading the needle x10 reps each direction      Manual Therapy   Soft tissue mobilization  STM Lt and Rt thoracic paraspinals    Myofascial Release  TPR Lt and Rt upper trap, Lt levator scap and rhomboids; myofascial release Lt and Rt subscap      Kinesiotix   Inhibit Muscle   Rt and Lt upper trap "I" strip, pt demonstrating self application on the Rt             PT Education - 08/20/17 0834    Education Details  application of tape    Person(s) Educated  Patient    Methods  Explanation;Demonstration;Verbal cues    Comprehension  Returned demonstration;Verbalized understanding       PT Short Term Goals - 08/16/17 0844      PT SHORT TERM GOAL #1   Title  pt will be ind with initial HEP    Time  3    Period  Weeks    Status  Achieved      PT SHORT TERM GOAL #2   Title  The patient will have improved cervical sidebending to 40 degrees and rotation bilaterally with minimal increase in pain    Time  3    Period  Weeks    Status  On-going      PT SHORT TERM GOAL #3   Title  The patient will report a 25% improvement in pain with using the computer at work    Time  3    Period  Weeks    Status  New        PT Long Term Goals - 08/08/17 0900      PT LONG TERM GOAL #1   Title  The patient will be independent in safe self progression of HEP for further gains in ROM and strength      Time  6    Period  Weeks    Status  New    Target Date  09/19/17      PT LONG TERM GOAL #2   Title  The patient will report a 50% or greater reduction in symptoms when driving     Time  6    Period  Weeks    Status  New    Target Date  09/19/17       PT LONG TERM GOAL #3   Title  The patient will have deep cervical muscle endurance needed for maintaining better posture while working and driving for at least one hour at a time.    Time  6    Period  Weeks    Status  New    Target Date  09/19/17      PT LONG TERM GOAL #4   Title  The patient will have improved scapular mobility in order to reduce strain on neck when managing her dog    Baseline  unable to lift scapula away from ribcage    Time  6    Period  Weeks    Status  New    Target Date  09/19/17      PT LONG TERM GOAL #5   Title  FOTO functional outcome score improved to 35% or less, indicating decreased pain and improved function    Time  6    Period  Weeks    Status  New    Target Date  09/19/17            Plan - 08/20/17 0844    Clinical Impression Statement  Pt reports 2/10 pain upon arrival. Noted palpable trigger points throughout the scapular region, with local twitch response elicited with this during manual treatment. Pt reports good relief with kinesiotape throughout the day and therapist educated pt on self-application to B upper traps. She did require moderate cuing, but verbalized understanding end of session. Pain was resolved end of session.    Rehab Potential  Excellent    PT Frequency  2x / week    PT Duration  6 weeks    PT Treatment/Interventions  ADLs/Self Care Home Management;Biofeedback;Cryotherapy;Electrical Stimulation;Moist Heat;Ultrasound;Therapeutic activities;Therapeutic exercise;Neuromuscular re-education;Patient/family education;Manual techniques;Passive range of motion;Taping  PT Next Visit Plan  STM and myofascial to cervical and scapular muscles, thoracic extension and rotation, posture strengthening    PT Home Exercise Plan  Access Code: [email or web address]    Consulted and Agree with Plan of Care  Patient       Patient will benefit from skilled therapeutic intervention in order to improve the following deficits and impairments:  Pain,  Increased fascial restricitons, Increased muscle spasms, Postural dysfunction, Decreased strength, Decreased range of motion  Visit Diagnosis: Muscle spasm of back  Other muscle spasm  Cervicalgia     Problem List Patient Active Problem List   Diagnosis Date Noted  . Medication management 06/10/2017  . Anxiety 01/31/2017  . Bipolar disorder ([place]) 01/31/2017  . Depressive disorder 01/31/2017  . Iron deficiency anemia 05/11/2015  . Allergic sinusitis 06/05/2013  . Muscle spasms of neck trapezious 04/22/2012  . GERD (gastroesophageal reflux disease) 04/22/2012  . Drug interaction 04/22/2012  . BENIGN NEOPLASM OF SKIN SITE UNSPECIFIED 07/25/2009  . DEPRESSION 11/07/2007  . ALLERGIC RHINITIS 11/07/2007  . 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 GERD 11/07/2007   8:50 AM,08/20/17 Sherol Dade PT, DPT [employer] [facility] [address] [place], [place], [address] Phone: [phone]   Fax:  [phone]  Name: Lisa Perkins MRN: [id number] Date of Birth: [birth year]

## 2017-08-23 ENCOUNTER — Ambulatory Visit: Payer: BLUE CROSS/BLUE SHIELD | Admitting: Physical Therapy

## 2017-08-23 DIAGNOSIS — M62830 Muscle spasm of back: Secondary | ICD-10-CM | POA: Diagnosis not present

## 2017-08-23 DIAGNOSIS — G8929 Other chronic pain: Secondary | ICD-10-CM

## 2017-08-23 DIAGNOSIS — M542 Cervicalgia: Secondary | ICD-10-CM

## 2017-08-23 DIAGNOSIS — M545 Low back pain: Secondary | ICD-10-CM

## 2017-08-23 DIAGNOSIS — M62838 Other muscle spasm: Secondary | ICD-10-CM

## 2017-08-23 NOTE — Therapy (Signed)
[facility] [address] [place], [place], [address] Phone: [phone]   Fax:  [phone]  Physical Therapy Treatment  Patient Details  Name: Lisa Perkins MRN: [id number] Date of Birth: 10-14-1965 Referring Provider: Burnis Medin, MD    Encounter Date: 08/23/2017  PT End of Session - 08/23/17 0841    Visit Number  5    Date for PT Re-Evaluation  09/19/17    PT Start Time  0804    PT Stop Time  2947    PT Time Calculation (min)  38 min    Activity Tolerance  Patient tolerated treatment well;No increased pain    Behavior During Therapy  WFL for tasks assessed/performed       Past Medical History:  Diagnosis Date  . Abnormal Pap smear of cervix    just a repeat done  . ALLERGIC RHINITIS 11/07/2007  . Asthma as child  . BENIGN NEOPLASM OF SKIN SITE UNSPECIFIED 07/25/2009  . DEPRESSION 11/07/2007  . Eczema   . GERD 11/07/2007   LPR - laryngopharyngeal reflux  . 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 TONSILLECTOMY AND ADENOIDECTOMY    . WISDOM TOOTH EXTRACTION      There were no vitals filed for this visit.  Subjective Assessment - 08/23/17 0806    Subjective  Pt reports that things are going ok. She has no neck pain, but has some tingling over the Rt side of the neck. This is usually just in the mornings.     Limitations  Walking;Other (comment);House hold activities driving, self care such as shaving    Patient Stated Goals  neck, shoulders, and upper back is the worst    Currently in Pain?  No/denies    Pain Onset  More than a month ago         [facility] PT Assessment - 08/23/17 0001      AROM   Cervical - Right Rotation  50 Lt side neck pain     Cervical - Left Rotation  50 pain Lt side of neck                    [facility] Adult PT Treatment/Exercise - 08/23/17 0001      Neck Exercises: Supine   Other Supine Exercise  scap retraction over foam  roll x15 reps; snow angel over foam roll x15 reps     Other Supine Exercise  B shoulder flexion with yellow TB resistance around forearms x15 reps      Neck Exercises: Sidelying   Other Sidelying Exercise  thoracic rotation each direction x15 reps      Manual Therapy   Soft tissue mobilization  sub occipital release Lt greater than Rt     Myofascial Release  TPR Lt upper trap      Neck Exercises: Stretches   Other Neck Stretches  cervical rotation stretch with towel 5x10 sec each              PT Education - 08/23/17 0841    Education Details  technique with therex     Person(s) Educated  Patient    Methods  Explanation    Comprehension  Verbalized understanding       PT Short Term Goals - 08/16/17 0844      PT SHORT TERM GOAL #1   Title  pt will be ind with initial HEP    Time  3    Period  Weeks    Status  Achieved      PT SHORT TERM GOAL #2   Title  The patient will have improved cervical sidebending to 40 degrees and rotation bilaterally with minimal increase in pain    Time  3    Period  Weeks    Status  On-going      PT SHORT TERM GOAL #3   Title  The patient will report a 25% improvement in pain with using the computer at work    Time  3    Period  Weeks    Status  New        PT Long Term Goals - 08/08/17 0900      PT LONG TERM GOAL #1   Title  The patient will be independent in safe self progression of HEP for further gains in ROM and strength      Time  6    Period  Weeks    Status  New    Target Date  09/19/17      PT LONG TERM GOAL #2   Title  The patient will report a 50% or greater reduction in symptoms when driving     Time  6    Period  Weeks    Status  New    Target Date  09/19/17      PT LONG TERM GOAL #3   Title  The patient will have deep cervical muscle endurance needed for maintaining better posture while working and driving for at least one hour at a time.    Time  6    Period  Weeks    Status  New    Target Date  09/19/17       PT LONG TERM GOAL #4   Title  The patient will have improved scapular mobility in order to reduce strain on neck when managing her dog    Baseline  unable to lift scapula away from ribcage    Time  6    Period  Weeks    Status  New    Target Date  09/19/17      PT LONG TERM GOAL #5   Title  FOTO functional outcome score improved to 35% or less, indicating decreased pain and improved function    Time  6    Period  Weeks    Status  New    Target Date  09/19/17            Plan - 08/23/17 0849    Clinical Impression Statement  Pt is making progress towards her goals, demonstrating improved cervical active rotation to atleast 50 deg each direction. Session continued with therex to improve thoracic mobility and strength of the scapula musculature. Completed soft tissue mobilization to the sub occipitals to decrease muscle spasm on the Lt primarily and added a self-rotation stretch for the pt to complete at home. No pain was reported end of today's session.    Rehab Potential  Excellent    PT Frequency  2x / week    PT Duration  6 weeks    PT Treatment/Interventions  ADLs/Self Care Home Management;Biofeedback;Cryotherapy;Electrical Stimulation;Moist Heat;Ultrasound;Therapeutic activities;Therapeutic exercise;Neuromuscular re-education;Patient/family education;Manual techniques;Passive range of motion;Taping    PT Next Visit Plan  taping for posture correction; STM and myofascial to cervical and scapular muscles as needed, thoracic extension and rotation, posture strengthening    PT Home Exercise Plan  Access Code: [email or web address]  Consulted and Agree with Plan of Care  Patient       Patient will benefit from skilled therapeutic intervention in order to improve the following deficits and impairments:  Pain, Increased fascial restricitons, Increased muscle spasms, Postural dysfunction, Decreased strength, Decreased range of motion  Visit Diagnosis: Muscle spasm of back  Other muscle  spasm  Cervicalgia  Chronic low back pain, unspecified back pain laterality, with sciatica presence unspecified     Problem List Patient Active Problem List   Diagnosis Date Noted  . Medication management 06/10/2017  . Anxiety 01/31/2017  . Bipolar disorder ([place]) 01/31/2017  . Depressive disorder 01/31/2017  . Iron deficiency anemia 05/11/2015  . Allergic sinusitis 06/05/2013  . Muscle spasms of neck trapezious 04/22/2012  . GERD (gastroesophageal reflux disease) 04/22/2012  . Drug interaction 04/22/2012  . BENIGN NEOPLASM OF SKIN SITE UNSPECIFIED 07/25/2009  . DEPRESSION 11/07/2007  . ALLERGIC RHINITIS 11/07/2007  . 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 GERD 11/07/2007    9:00 AM,08/23/17 Sherol Dade PT, DPT [place] [facility] [address] [place], [place], [address] Phone: [phone]   Fax:  [phone]  Name: ANORAH TRIAS MRN: [id number] Date of Birth: [birth year]

## 2017-08-27 ENCOUNTER — Ambulatory Visit: Payer: BLUE CROSS/BLUE SHIELD | Admitting: Physical Therapy

## 2017-08-27 DIAGNOSIS — M62830 Muscle spasm of back: Secondary | ICD-10-CM | POA: Diagnosis not present

## 2017-08-27 DIAGNOSIS — G8929 Other chronic pain: Secondary | ICD-10-CM

## 2017-08-27 DIAGNOSIS — M542 Cervicalgia: Secondary | ICD-10-CM

## 2017-08-27 DIAGNOSIS — M62838 Other muscle spasm: Secondary | ICD-10-CM

## 2017-08-27 DIAGNOSIS — M545 Low back pain: Secondary | ICD-10-CM

## 2017-08-27 NOTE — Therapy (Signed)
[facility] [address] [place], [place], [address] Phone: [phone]   Fax:  [phone]  Physical Therapy Treatment  Patient Details  Name: Lisa Perkins MRN: [id number] Date of Birth: 1966-02-06 Referring Provider: Burnis Medin, MD    Encounter Date: 08/27/2017  PT End of Session - 08/27/17 0843    Visit Number  6    Date for PT Re-Evaluation  09/19/17    PT Start Time  0803    PT Stop Time  0841    PT Time Calculation (min)  38 min    Activity Tolerance  Patient tolerated treatment well;No increased pain    Behavior During Therapy  WFL for tasks assessed/performed       Past Medical History:  Diagnosis Date  . Abnormal Pap smear of cervix    just a repeat done  . ALLERGIC RHINITIS 11/07/2007  . Asthma as child  . BENIGN NEOPLASM OF SKIN SITE UNSPECIFIED 07/25/2009  . DEPRESSION 11/07/2007  . Eczema   . GERD 11/07/2007   LPR - laryngopharyngeal reflux  . 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 TONSILLECTOMY AND ADENOIDECTOMY    . WISDOM TOOTH EXTRACTION      There were no vitals filed for this visit.  Subjective Assessment - 08/27/17 0807    Subjective  Pt reports that she is a little sore in her Lt and Rt side of the neck, but it's not bad. She had some itching and skin irritation from her tape at home.     Limitations  Walking;Other (comment);House hold activities driving, self care such as shaving    Patient Stated Goals  neck, shoulders, and upper back is the worst    Currently in Pain?  Yes    Pain Score  2     Pain Location  Neck    Pain Orientation  Right;Left;Upper    Pain Descriptors / Indicators  Sore    Pain Type  Chronic pain    Pain Radiating Towards  none     Pain Onset  More than a month ago    Pain Frequency  Intermittent    Aggravating Factors   just from exercises, not usually an issue    Pain Relieving Factors  pushing on the spot              [facility] Adult PT Treatment/Exercise - 08/27/17 0001      Neck Exercises: Standing   Other Standing Exercises  standing D2 flexion with Lt and cervical retraction against wall x5 reps     Other Standing Exercises BUE ER with elbows by side, green TB x15 reps   BUE horizontal abduction with green TB x15 reps       Neck Exercises: Seated   Upper Extremity D2  10 reps;Flexion;Theraband    Theraband Level (UE D2)  Level 3 (Green)    Other Seated Exercise  thoracic extension over towel roll x10 reps at T6,      Manual Therapy   Manual Therapy  Joint mobilization    Joint Mobilization  Grade III-IV CPAs upper thoracic spine x1 bout     Myofascial Release  sub occipital release Lt and Rt; Lt trigger point release to upper trap      Kinesiotix   Inhibit Muscle   power strip across middle traps; Y strip from low cervical spine to middle traps  PT Education - 08/27/17 [phone]    Education Details  recommended discontinuing kinesiotape if pt is noticing skin irritation after this application; progressions to HEP    Person(s) Educated  Patient    Methods  Explanation;Verbal cues    Comprehension  Verbalized understanding;Returned demonstration       PT Short Term Goals - 08/16/17 0844      PT SHORT TERM GOAL #1   Title  pt will be ind with initial HEP    Time  3    Period  Weeks    Status  Achieved      PT SHORT TERM GOAL #2   Title  The patient will have improved cervical sidebending to 40 degrees and rotation bilaterally with minimal increase in pain    Time  3    Period  Weeks    Status  On-going      PT SHORT TERM GOAL #3   Title  The patient will report a 25% improvement in pain with using the computer at work    Time  3    Period  Weeks    Status  New        PT Long Term Goals - 08/08/17 0900      PT LONG TERM GOAL #1   Title  The patient will be independent in safe self progression of HEP for further gains in ROM and strength      Time   6    Period  Weeks    Status  New    Target Date  09/19/17      PT LONG TERM GOAL #2   Title  The patient will report a 50% or greater reduction in symptoms when driving     Time  6    Period  Weeks    Status  New    Target Date  09/19/17      PT LONG TERM GOAL #3   Title  The patient will have deep cervical muscle endurance needed for maintaining better posture while working and driving for at least one hour at a time.    Time  6    Period  Weeks    Status  New    Target Date  09/19/17      PT LONG TERM GOAL #4   Title  The patient will have improved scapular mobility in order to reduce strain on neck when managing her dog    Baseline  unable to lift scapula away from ribcage    Time  6    Period  Weeks    Status  New    Target Date  09/19/17      PT LONG TERM GOAL #5   Title  FOTO functional outcome score improved to 35% or less, indicating decreased pain and improved function    Time  6    Period  Weeks    Status  New    Target Date  09/19/17            Plan - 08/27/17 0843    Clinical Impression Statement  Pt continues to self-apply kinesiotape at home for posture support, however she notes some skin irritation with this lately. Therapist applied tape to the thoracic spine for more posture cuing, and suggested this be discontinued if skin irritation occurs. Pt was agreeable to this. Made some progressions to her HEP, introducing an upright posture, however pt required visual feedback to improve cervical alignment with this. Reported decrease  in her pain to 0/10 end of today's session. Will continue with current POC moving forward.    Rehab Potential  Excellent    PT Frequency  2x / week    PT Duration  6 weeks    PT Treatment/Interventions  ADLs/Self Care Home Management;Biofeedback;Cryotherapy;Electrical Stimulation;Moist Heat;Ultrasound;Therapeutic activities;Therapeutic exercise;Neuromuscular re-education;Patient/family education;Manual techniques;Passive range of  motion;Taping    PT Next Visit Plan  f/u on tape and use McConnel tape; STM and myofascial to cervical and scapular muscles as needed, thoracic extension and rotation, posture strengthening    PT Home Exercise Plan  Access Code: [email or web address]    Consulted and Agree with Plan of Care  Patient       Patient will benefit from skilled therapeutic intervention in order to improve the following deficits and impairments:  Pain, Increased fascial restricitons, Increased muscle spasms, Postural dysfunction, Decreased strength, Decreased range of motion  Visit Diagnosis: Muscle spasm of back  Other muscle spasm  Cervicalgia  Chronic low back pain, unspecified back pain laterality, with sciatica presence unspecified     Problem List Patient Active Problem List   Diagnosis Date Noted  . Medication management 06/10/2017  . Anxiety 01/31/2017  . Bipolar disorder ([place]) 01/31/2017  . Depressive disorder 01/31/2017  . Iron deficiency anemia 05/11/2015  . Allergic sinusitis 06/05/2013  . Muscle spasms of neck trapezious 04/22/2012  . GERD (gastroesophageal reflux disease) 04/22/2012  . Drug interaction 04/22/2012  . BENIGN NEOPLASM OF SKIN SITE UNSPECIFIED 07/25/2009  . DEPRESSION 11/07/2007  . ALLERGIC RHINITIS 11/07/2007  . 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 GERD 11/07/2007    8:49 AM,08/27/17 Sherol Dade PT, DPT [place] [facility] [address], [place], [place], [address] Phone: [phone]   Fax:  [phone]  Name: Lisa Perkins MRN: [id number] Date of Birth: [birth year]

## 2017-08-30 ENCOUNTER — Ambulatory Visit: Payer: BLUE CROSS/BLUE SHIELD | Admitting: Physical Therapy

## 2017-08-30 DIAGNOSIS — M62830 Muscle spasm of back: Secondary | ICD-10-CM | POA: Diagnosis not present

## 2017-08-30 DIAGNOSIS — M542 Cervicalgia: Secondary | ICD-10-CM

## 2017-08-30 DIAGNOSIS — G8929 Other chronic pain: Secondary | ICD-10-CM

## 2017-08-30 DIAGNOSIS — M545 Low back pain: Secondary | ICD-10-CM

## 2017-08-30 DIAGNOSIS — M62838 Other muscle spasm: Secondary | ICD-10-CM

## 2017-08-30 NOTE — Therapy (Signed)
[facility] [address] [place], [place], [address] Phone: [phone]   Fax:  [phone]  Physical Therapy Treatment  Patient Details  Name: Lisa Perkins MRN: [id number] Date of Birth: 14-Sep-1965 Referring Provider: Burnis Medin, MD    Encounter Date: 08/30/2017  PT End of Session - 08/30/17 0845    Visit Number  6    Date for PT Re-Evaluation  09/19/17    PT Start Time  0803    PT Stop Time  0845    PT Time Calculation (min)  42 min    Activity Tolerance  Patient tolerated treatment well;No increased pain    Behavior During Therapy  WFL for tasks assessed/performed       Past Medical History:  Diagnosis Date  . Abnormal Pap smear of cervix    just a repeat done  . ALLERGIC RHINITIS 11/07/2007  . Asthma as child  . BENIGN NEOPLASM OF SKIN SITE UNSPECIFIED 07/25/2009  . DEPRESSION 11/07/2007  . Eczema   . GERD 11/07/2007   LPR - laryngopharyngeal reflux  . 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 TONSILLECTOMY AND ADENOIDECTOMY    . WISDOM TOOTH EXTRACTION      There were no vitals filed for this visit.  Subjective Assessment - 08/30/17 0807    Subjective  Pt reprorts that the tape lasted for about 9 hours, but it still had irritation on her skin. She will notice some pain and discomfort with tilting her head down at an angle.     Limitations  Walking;Other (comment);House hold activities driving, self care such as shaving    Patient Stated Goals  neck, shoulders, and upper back is the worst    Currently in Pain?  Yes    Pain Score  4     Pain Location  Neck    Pain Orientation  Left;Upper    Pain Descriptors / Indicators  Sore    Pain Type  Chronic pain    Pain Radiating Towards  none     Pain Onset  More than a month ago    Pain Frequency  Intermittent    Aggravating Factors   looking down at an angle    Pain Relieving Factors  pushin on the spot                        [facility] Adult PT Treatment/Exercise - 08/30/17 0001      Self-Care   Posture  spinal curvatures and importance of maintaining curves to ensure muscles are activating properly; using towel roll in the car to improve posture on long drives       Neck Exercises: Machines for Strengthening   UBE (Upper Arm Bike)  x2 min forward/backward, PT present to discuss posture      Neck Exercises: Seated   Other Seated Exercise  thoracic rotation x10 reps each direction, 5 reps following with PT overpressure       Neck Exercises: Supine   Shoulder Flexion  Both;15 reps;Limitations    Shoulder Flexion Limitations  red TB      Manual Therapy   Joint Mobilization  Lt cervical rotation MWM x10 reps     Soft tissue mobilization  STM Lt and Rt cervical paraspinals    Myofascial Release  sub occipital release Lt       quadruped cat/cow x15 reps  PT Education - 08/30/17 0853    Education Details  see self-care session for more details     Person(s) Educated  Patient    Methods  Explanation    Comprehension  Verbalized understanding       PT Short Term Goals - 08/16/17 0844      PT SHORT TERM GOAL #1   Title  pt will be ind with initial HEP    Time  3    Period  Weeks    Status  Achieved      PT SHORT TERM GOAL #2   Title  The patient will have improved cervical sidebending to 40 degrees and rotation bilaterally with minimal increase in pain    Time  3    Period  Weeks    Status  On-going      PT SHORT TERM GOAL #3   Title  The patient will report a 25% improvement in pain with using the computer at work    Time  3    Period  Weeks    Status  New        PT Long Term Goals - 08/08/17 0900      PT LONG TERM GOAL #1   Title  The patient will be independent in safe self progression of HEP for further gains in ROM and strength      Time  6    Period  Weeks    Status  New    Target Date  09/19/17      PT LONG TERM GOAL #2   Title  The  patient will report a 50% or greater reduction in symptoms when driving     Time  6    Period  Weeks    Status  New    Target Date  09/19/17      PT LONG TERM GOAL #3   Title  The patient will have deep cervical muscle endurance needed for maintaining better posture while working and driving for at least one hour at a time.    Time  6    Period  Weeks    Status  New    Target Date  09/19/17      PT LONG TERM GOAL #4   Title  The patient will have improved scapular mobility in order to reduce strain on neck when managing her dog    Baseline  unable to lift scapula away from ribcage    Time  6    Period  Weeks    Status  New    Target Date  09/19/17      PT LONG TERM GOAL #5   Title  FOTO functional outcome score improved to 35% or less, indicating decreased pain and improved function    Time  6    Period  Weeks    Status  New    Target Date  09/19/17            Plan - 08/30/17 0847    Clinical Impression Statement  Pt has been completing her HEP as instructed, noting several concerns with technique and overall posture. Therapist was able to educate on spinal alignment and posture, with pt verbalizing good understanding of this. Session focused on therex to improve thoracic mobility and decrease cervical muscle spasm. Pt did have improved Lt rotation of the neck following soft tissue mobilization to the sub occipitals and paraspinals, however Rt rotation remained unchanged. Will continue to progress cervical and scapular  strength and mobility moving forward.     Rehab Potential  Excellent    PT Frequency  2x / week    PT Duration  6 weeks    PT Treatment/Interventions  ADLs/Self Care Home Management;Biofeedback;Cryotherapy;Electrical Stimulation;Moist Heat;Ultrasound;Therapeutic activities;Therapeutic exercise;Neuromuscular re-education;Patient/family education;Manual techniques;Passive range of motion;Taping    PT Next Visit Plan  seated rows; low trap strengthening; lat  stretch; STM and myofascial to cervical and scapular muscles as needed, thoracic extension and rotation, posture strengthening    PT Home Exercise Plan  Access Code: [email or web address]    Consulted and Agree with Plan of Care  Patient       Patient will benefit from skilled therapeutic intervention in order to improve the following deficits and impairments:  Pain, Increased fascial restricitons, Increased muscle spasms, Postural dysfunction, Decreased strength, Decreased range of motion  Visit Diagnosis: Muscle spasm of back  Other muscle spasm  Cervicalgia  Chronic low back pain, unspecified back pain laterality, with sciatica presence unspecified     Problem List Patient Active Problem List   Diagnosis Date Noted  . Medication management 06/10/2017  . Anxiety 01/31/2017  . Bipolar disorder ([place]) 01/31/2017  . Depressive disorder 01/31/2017  . Iron deficiency anemia 05/11/2015  . Allergic sinusitis 06/05/2013  . Muscle spasms of neck trapezious 04/22/2012  . GERD (gastroesophageal reflux disease) 04/22/2012  . Drug interaction 04/22/2012  . BENIGN NEOPLASM OF SKIN SITE UNSPECIFIED 07/25/2009  . DEPRESSION 11/07/2007  . ALLERGIC RHINITIS 11/07/2007  . 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 GERD 11/07/2007    8:55 AM,08/30/17 Sherol Dade PT, DPT [place] [facility] [address], [place], [place], [address] Phone: [phone]   Fax:  [phone]  Name: Lisa Perkins MRN: [id number] Date of Birth: June 06, 1965

## 2017-09-03 ENCOUNTER — Ambulatory Visit: Payer: BLUE CROSS/BLUE SHIELD

## 2017-09-03 DIAGNOSIS — M62830 Muscle spasm of back: Secondary | ICD-10-CM | POA: Diagnosis not present

## 2017-09-03 DIAGNOSIS — M542 Cervicalgia: Secondary | ICD-10-CM

## 2017-09-03 DIAGNOSIS — M62838 Other muscle spasm: Secondary | ICD-10-CM

## 2017-09-03 DIAGNOSIS — M545 Low back pain: Secondary | ICD-10-CM

## 2017-09-03 DIAGNOSIS — G8929 Other chronic pain: Secondary | ICD-10-CM

## 2017-09-03 NOTE — Therapy (Signed)
[facility] [address], [place], [place], [address] Phone: [phone]   Fax:  [phone]  Physical Therapy Treatment  Patient Details  Name: Lisa Perkins MRN: [id number] Date of Birth: [birth year] Referring Provider: Burnis Medin, MD    Encounter Date: 09/03/2017  PT End of Session - 09/03/17 0846    Visit Number  7    Date for PT Re-Evaluation  09/19/17    Authorization - Visit Number  7    Authorization - Number of Visits  60    PT Start Time  0800    PT Stop Time  0623    PT Time Calculation (min)  55 min    Activity Tolerance  Patient tolerated treatment well;No increased pain    Behavior During Therapy  WFL for tasks assessed/performed       Past Medical History:  Diagnosis Date  . Abnormal Pap smear of cervix    just a repeat done  . ALLERGIC RHINITIS 11/07/2007  . Asthma as child  . BENIGN NEOPLASM OF SKIN SITE UNSPECIFIED 07/25/2009  . DEPRESSION 11/07/2007  . Eczema   . GERD 11/07/2007   LPR - laryngopharyngeal reflux  . 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 TONSILLECTOMY AND ADENOIDECTOMY    . WISDOM TOOTH EXTRACTION      There were no vitals filed for this visit.  Subjective Assessment - 09/03/17 0810    Subjective  I am feeling better.      Currently in Pain?  Yes    Pain Score  2     Pain Location  Neck    Pain Orientation  Right    Pain Descriptors / Indicators  Sore    Pain Type  Chronic pain    Pain Onset  More than a month ago    Pain Frequency  Intermittent    Aggravating Factors   turning head    Pain Relieving Factors  heat, exercises                       [facility] Adult PT Treatment/Exercise - 09/03/17 0001      Neck Exercises: Machines for Strengthening   UBE (Upper Arm Bike)  Level 2x 6 minutes (3/3)      Neck Exercises: Supine   Other Supine Exercise  on foam roll: decompression x 3 minutes.  Then green  band horizontal abduction, D2. ER and horizontal abduction x 15 each    Other Supine Exercise  Melt Method on foam roll for tissue mobilization      Neck Exercises: Sidelying   Other Sidelying Exercise  open book stretch x 10 bil.       Modalities   Modalities  Moist Heat      Moist Heat Therapy   Number Minutes Moist Heat  10 Minutes    Moist Heat Location  Cervical      Manual Therapy   Manual therapy comments  elongation and TP release to bil cervical paraspinals, SCM, and scalenies rhomboids left shoulder    Myofascial Release  sub occipital release Lt               PT Short Term Goals - 08/16/17 0844      PT SHORT TERM GOAL #1   Title  pt will be ind with initial HEP    Time  3    Period  Weeks  Status  Achieved      PT SHORT TERM GOAL #2   Title  The patient will have improved cervical sidebending to 40 degrees and rotation bilaterally with minimal increase in pain    Time  3    Period  Weeks    Status  On-going      PT SHORT TERM GOAL #3   Title  The patient will report a 25% improvement in pain with using the computer at work    Time  3    Period  Weeks    Status  New        PT Long Term Goals - 08/08/17 0900      PT LONG TERM GOAL #1   Title  The patient will be independent in safe self progression of HEP for further gains in ROM and strength      Time  6    Period  Weeks    Status  New    Target Date  09/19/17      PT LONG TERM GOAL #2   Title  The patient will report a 50% or greater reduction in symptoms when driving     Time  6    Period  Weeks    Status  New    Target Date  09/19/17      PT LONG TERM GOAL #3   Title  The patient will have deep cervical muscle endurance needed for maintaining better posture while working and driving for at least one hour at a time.    Time  6    Period  Weeks    Status  New    Target Date  09/19/17      PT LONG TERM GOAL #4   Title  The patient will have improved scapular mobility in order to  reduce strain on neck when managing her dog    Baseline  unable to lift scapula away from ribcage    Time  6    Period  Weeks    Status  New    Target Date  09/19/17      PT LONG TERM GOAL #5   Title  FOTO functional outcome score improved to 35% or less, indicating decreased pain and improved function    Time  6    Period  Weeks    Status  New    Target Date  09/19/17            Plan - 09/03/17 0810    Clinical Impression Statement  Pt reports that she is making postural corrections at home and with work.  Pt is independent and compliant with HEP for postural strength and flexibility. Pt with improved stability with open book stretch today.   Pt with tension and trigger points in bil neck and upper trap with manual therapy.  Pt will continue to benefit from skilled PT for postural strength, endurance, flexibility and manual therapy as needed.      Rehab Potential  Excellent    PT Frequency  2x / week    PT Duration  6 weeks    PT Treatment/Interventions  ADLs/Self Care Home Management;Biofeedback;Cryotherapy;Electrical Stimulation;Moist Heat;Ultrasound;Therapeutic activities;Therapeutic exercise;Neuromuscular re-education;Patient/family education;Manual techniques;Passive range of motion;Taping    PT Next Visit Plan  postural strength, manual therapy, thoracic extension.      PT Home Exercise Plan  Access Code: [email or web address]    Recommended Other Services  initial certification is signed    Consulted and Agree with Plan of  Care  Patient       Patient will benefit from skilled therapeutic intervention in order to improve the following deficits and impairments:  Pain, Increased fascial restricitons, Increased muscle spasms, Postural dysfunction, Decreased strength, Decreased range of motion  Visit Diagnosis: Muscle spasm of back  Other muscle spasm  Cervicalgia  Chronic low back pain, unspecified back pain laterality, with sciatica presence unspecified     Problem  List Patient Active Problem List   Diagnosis Date Noted  . Medication management 06/10/2017  . Anxiety 01/31/2017  . Bipolar disorder ([place]) 01/31/2017  . Depressive disorder 01/31/2017  . Iron deficiency anemia 05/11/2015  . Allergic sinusitis 06/05/2013  . Muscle spasms of neck trapezious 04/22/2012  . GERD (gastroesophageal reflux disease) 04/22/2012  . Drug interaction 04/22/2012  . BENIGN NEOPLASM OF SKIN SITE UNSPECIFIED 07/25/2009  . DEPRESSION 11/07/2007  . ALLERGIC RHINITIS 11/07/2007  . 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 GERD 11/07/2007    Sigurd Sos, PT 09/03/17 8:47 AM  [facility] [address] [place], [place], [address] Phone: [phone]   Fax:  [phone]  Name: DAENA ALPER MRN: [id number] Date of Birth: 11/13/1965

## 2017-09-06 ENCOUNTER — Ambulatory Visit: Payer: BLUE CROSS/BLUE SHIELD | Admitting: Physical Therapy

## 2017-09-06 ENCOUNTER — Encounter: Payer: Self-pay | Admitting: Physical Therapy

## 2017-09-06 DIAGNOSIS — G8929 Other chronic pain: Secondary | ICD-10-CM

## 2017-09-06 DIAGNOSIS — M62838 Other muscle spasm: Secondary | ICD-10-CM

## 2017-09-06 DIAGNOSIS — M62830 Muscle spasm of back: Secondary | ICD-10-CM

## 2017-09-06 DIAGNOSIS — M545 Low back pain: Secondary | ICD-10-CM

## 2017-09-06 DIAGNOSIS — M542 Cervicalgia: Secondary | ICD-10-CM

## 2017-09-06 NOTE — Therapy (Signed)
[facility] [address] [place], [place], [address] Phone: [phone]   Fax:  [phone]  Physical Therapy Treatment  Patient Details  Name: Lisa Perkins MRN: [id number] Date of Birth: 02-09-1966 Referring Provider: Burnis Medin, MD    Encounter Date: 09/06/2017  PT End of Session - 09/06/17 0845    Visit Number  8    Date for PT Re-Evaluation  09/19/17    Authorization - Visit Number  8    Authorization - Number of Visits  60    PT Start Time  0802    PT Stop Time  1324    PT Time Calculation (min)  42 min    Activity Tolerance  Patient tolerated treatment well;No increased pain    Behavior During Therapy  WFL for tasks assessed/performed       Past Medical History:  Diagnosis Date  . Abnormal Pap smear of cervix    just a repeat done  . ALLERGIC RHINITIS 11/07/2007  . Asthma as child  . BENIGN NEOPLASM OF SKIN SITE UNSPECIFIED 07/25/2009  . DEPRESSION 11/07/2007  . Eczema   . GERD 11/07/2007   LPR - laryngopharyngeal reflux  . 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 TONSILLECTOMY AND ADENOIDECTOMY    . WISDOM TOOTH EXTRACTION      There were no vitals filed for this visit.  Subjective Assessment - 09/06/17 0804    Subjective  Pt reports that things are going well. She is having some soreness in her scap region and her driving has aggravated this.     Currently in Pain?  Yes    Pain Score  4     Pain Location  Thoracic    Pain Orientation  Right;Left    Pain Descriptors / Indicators  Aching    Pain Type  Chronic pain    Pain Radiating Towards  none     Pain Onset  More than a month ago    Pain Frequency  Intermittent    Aggravating Factors   alot of driving    Pain Relieving Factors  heat, rest                        [facility] Adult PT Treatment/Exercise - 09/06/17 0001      Neck Exercises: Machines for Strengthening   UBE (Upper  Arm Bike)  L2 x2 min forward/backward      Neck Exercises: Seated   Other Seated Exercise  B lat stretch over foam roll with end range thoracic extension x10 reps     Other Seated Exercise  thoracic lateral flexion stretch with UE reach x10 reps each direction; thoracic flexion stretch x10 reps      Neck Exercises: Supine   Other Supine Exercise  thoracic extension and rolling over soft foam roll       Manual Therapy   Soft tissue mobilization  STM thoracic paraspinals, rhomboids, middle trap      Neck Exercises: Stretches   Other Neck Stretches  tricep stretch each side 2x20 sec     Other Neck Stretches  cross body stretch 1x20 sec each              PT Education - 09/06/17 0845    Education Details  atlernating strengthening with rolling and stretching to allow muscle to recover    Person(s) Educated  Patient    Methods  Explanation    Comprehension  Verbalized understanding       PT Short Term Goals - 08/16/17 0844      PT SHORT TERM GOAL #1   Title  pt will be ind with initial HEP    Time  3    Period  Weeks    Status  Achieved      PT SHORT TERM GOAL #2   Title  The patient will have improved cervical sidebending to 40 degrees and rotation bilaterally with minimal increase in pain    Time  3    Period  Weeks    Status  On-going      PT SHORT TERM GOAL #3   Title  The patient will report a 25% improvement in pain with using the computer at work    Time  3    Period  Weeks    Status  New        PT Long Term Goals - 08/08/17 0900      PT LONG TERM GOAL #1   Title  The patient will be independent in safe self progression of HEP for further gains in ROM and strength      Time  6    Period  Weeks    Status  New    Target Date  09/19/17      PT LONG TERM GOAL #2   Title  The patient will report a 50% or greater reduction in symptoms when driving     Time  6    Period  Weeks    Status  New    Target Date  09/19/17      PT LONG TERM GOAL #3   Title   The patient will have deep cervical muscle endurance needed for maintaining better posture while working and driving for at least one hour at a time.    Time  6    Period  Weeks    Status  New    Target Date  09/19/17      PT LONG TERM GOAL #4   Title  The patient will have improved scapular mobility in order to reduce strain on neck when managing her dog    Baseline  unable to lift scapula away from ribcage    Time  6    Period  Weeks    Status  New    Target Date  09/19/17      PT LONG TERM GOAL #5   Title  FOTO functional outcome score improved to 35% or less, indicating decreased pain and improved function    Time  6    Period  Weeks    Status  New    Target Date  09/19/17            Plan - 09/06/17 0846    Clinical Impression Statement  Pt arrived with increased soreness likely secondary to muscle fatigue following exercise progressions at home. Focused today on therex and manual treatment to decrease muscle spasm and improve flexibility of the thoracic and scapular region. Pt does have palpable trigger points in the rhomboids, but was able to address this some with foam rolling education during today's session. Ended with decrease in pain noted and verbalized understanding of HEP adjustments. Will continue with current POC.     Rehab Potential  Excellent    PT Frequency  2x / week    PT Duration  6 weeks    PT Treatment/Interventions  ADLs/Self  Care Home Management;Biofeedback;Cryotherapy;Electrical Stimulation;Moist Heat;Ultrasound;Therapeutic activities;Therapeutic exercise;Neuromuscular re-education;Patient/family education;Manual techniques;Passive range of motion;Taping    PT Next Visit Plan  postural strength, manual therapy, thoracic extension.      PT Home Exercise Plan  Access Code: [email or web address]    Consulted and Agree with Plan of Care  Patient       Patient will benefit from skilled therapeutic intervention in order to improve the following deficits and  impairments:  Pain, Increased fascial restricitons, Increased muscle spasms, Postural dysfunction, Decreased strength, Decreased range of motion  Visit Diagnosis: Muscle spasm of back  Other muscle spasm  Cervicalgia  Chronic low back pain, unspecified back pain laterality, with sciatica presence unspecified     Problem List Patient Active Problem List   Diagnosis Date Noted  . Medication management 06/10/2017  . Anxiety 01/31/2017  . Bipolar disorder ([place]) 01/31/2017  . Depressive disorder 01/31/2017  . Iron deficiency anemia 05/11/2015  . Allergic sinusitis 06/05/2013  . Muscle spasms of neck trapezious 04/22/2012  . GERD (gastroesophageal reflux disease) 04/22/2012  . Drug interaction 04/22/2012  . BENIGN NEOPLASM OF SKIN SITE UNSPECIFIED 07/25/2009  . DEPRESSION 11/07/2007  . ALLERGIC RHINITIS 11/07/2007  . 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 GERD 11/07/2007    10:43 AM,09/06/17 Sherol Dade PT, DPT [place] [facility] [address] [place], [place], [address] Phone: [phone]   Fax:  [phone]  Name: IVONNE FREEBURG MRN: [id number] Date of Birth: 04/28/1965

## 2017-09-11 ENCOUNTER — Ambulatory Visit: Payer: BLUE CROSS/BLUE SHIELD | Admitting: Physical Therapy

## 2017-09-11 ENCOUNTER — Encounter: Payer: Self-pay | Admitting: Physical Therapy

## 2017-09-11 DIAGNOSIS — M62830 Muscle spasm of back: Secondary | ICD-10-CM

## 2017-09-11 DIAGNOSIS — M542 Cervicalgia: Secondary | ICD-10-CM

## 2017-09-11 DIAGNOSIS — G8929 Other chronic pain: Secondary | ICD-10-CM

## 2017-09-11 DIAGNOSIS — M62838 Other muscle spasm: Secondary | ICD-10-CM

## 2017-09-11 DIAGNOSIS — M545 Low back pain: Secondary | ICD-10-CM

## 2017-09-11 NOTE — Patient Instructions (Signed)
Access Code: [email or web address]  URL: [email or web address]  Date: 09/11/2017  Prepared by: Elly Modena   Exercises  Single Arm Doorway Pec Stretch at 90 Degrees Abduction - 3 reps - 1 sets - 30 sec hold - 1x daily - 7x weekly  Sidelying Open Book Thoracic Rotation with Knee on Foam Roll - 15 reps - 2x daily - 7x weekly  Supine Shoulder Horizontal Abduction with Resistance - 15 reps - 3 sets - 1x daily - 7x weekly  Seated Shoulder Diagonal Pulls with Resistance - 15 reps - 1x daily - 7x weekly  Seated Assisted Cervical Rotation with Towel - 10 reps - 3 sets - 1x daily - 7x weekly  Seated Serratus Press with Anchored Resistance - 15 reps - 3 sets - 1x daily - 7x weekly    [facility] [address], [place], [place] [address] Phone # [phone] Fax [phone]

## 2017-09-11 NOTE — Therapy (Signed)
[facility] [address] [place], [place], [address] Phone: [phone]   Fax:  [phone]  Physical Therapy Treatment  Patient Details  Name: Lisa Perkins MRN: [id number] Date of Birth: 01-20-1966 Referring Provider: Burnis Medin, MD    Encounter Date: 09/11/2017  PT End of Session - 09/11/17 0837    Visit Number  9    Date for PT Re-Evaluation  09/19/17    Authorization - Visit Number  8    Authorization - Number of Visits  60    PT Start Time  0802    PT Stop Time  7322    PT Time Calculation (min)  40 min    Activity Tolerance  Patient tolerated treatment well;No increased pain    Behavior During Therapy  WFL for tasks assessed/performed       Past Medical History:  Diagnosis Date  . Abnormal Pap smear of cervix    just a repeat done  . ALLERGIC RHINITIS 11/07/2007  . Asthma as child  . BENIGN NEOPLASM OF SKIN SITE UNSPECIFIED 07/25/2009  . DEPRESSION 11/07/2007  . Eczema   . GERD 11/07/2007   LPR - laryngopharyngeal reflux  . 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 TONSILLECTOMY AND ADENOIDECTOMY    . WISDOM TOOTH EXTRACTION      There were no vitals filed for this visit.  Subjective Assessment - 09/11/17 0803    Subjective  Pt reports that she was sore after driving this weekend up to see her dad. Her Rt scapula feels like it doesn't want to move and she has some discomfort on the Lt side of her upper back.     Currently in Pain?  Yes    Pain Score  3     Pain Location  Thoracic    Pain Orientation  Left;Upper    Pain Descriptors / Indicators  Aching    Pain Type  Chronic pain    Pain Radiating Towards  none     Pain Onset  More than a month ago    Pain Frequency  Intermittent    Aggravating Factors   alot of driving    Pain Relieving Factors  heat, rest                   [facility] Adult PT Treatment/Exercise - 09/11/17 0001      Neck  Exercises: Machines for Strengthening   UBE (Upper Arm Bike)  L2 x2 min forward/backward, PT present to discuss progress       Neck Exercises: Standing   Neck Retraction  15 reps;3 secs    Other Standing Exercises  closed chain BUE protraction x15 reps (elbow); shoulder protraction with elevation on roll against wall       Neck Exercises: Seated   Other Seated Exercise  B shoulder protraction with green TB x15 reps       Manual Therapy   Soft tissue mobilization  STM, thoracic paraspinals (Lt), rhomboids (Rt)    Myofascial Release  Rt subscapularis             PT Education - 09/11/17 0834    Education Details  technique with therex    Person(s) Educated  Patient    Methods  Explanation;Verbal cues    Comprehension  Verbalized understanding;Returned demonstration       PT Short Term Goals - 08/16/17 0844      PT SHORT  TERM GOAL #1   Title  pt will be ind with initial HEP    Time  3    Period  Weeks    Status  Achieved      PT SHORT TERM GOAL #2   Title  The patient will have improved cervical sidebending to 40 degrees and rotation bilaterally with minimal increase in pain    Time  3    Period  Weeks    Status  On-going      PT SHORT TERM GOAL #3   Title  The patient will report a 25% improvement in pain with using the computer at work    Time  3    Period  Weeks    Status  New        PT Long Term Goals - 08/08/17 0900      PT LONG TERM GOAL #1   Title  The patient will be independent in safe self progression of HEP for further gains in ROM and strength      Time  6    Period  Weeks    Status  New    Target Date  09/19/17      PT LONG TERM GOAL #2   Title  The patient will report a 50% or greater reduction in symptoms when driving     Time  6    Period  Weeks    Status  New    Target Date  09/19/17      PT LONG TERM GOAL #3   Title  The patient will have deep cervical muscle endurance needed for maintaining better posture while working and driving  for at least one hour at a time.    Time  6    Period  Weeks    Status  New    Target Date  09/19/17      PT LONG TERM GOAL #4   Title  The patient will have improved scapular mobility in order to reduce strain on neck when managing her dog    Baseline  unable to lift scapula away from ribcage    Time  6    Period  Weeks    Status  New    Target Date  09/19/17      PT LONG TERM GOAL #5   Title  FOTO functional outcome score improved to 35% or less, indicating decreased pain and improved function    Time  6    Period  Weeks    Status  New    Target Date  09/19/17            Plan - 09/11/17 0849    Clinical Impression Statement  Pt arrived with reports of pain and stiffness throughout the thoracic and scapular region, following increase in driving this past weekend. Therapist completed soft tissue mobilization, noting trigger points along the thoracic paraspinals. Also introduced therex to improve scapula control, with pt able to demonstrate good understanding with this. Ended session with reports of resolved pain. Will continue with current POC.    Rehab Potential  Excellent    PT Frequency  2x / week    PT Duration  6 weeks    PT Treatment/Interventions  ADLs/Self Care Home Management;Biofeedback;Cryotherapy;Electrical Stimulation;Moist Heat;Ultrasound;Therapeutic activities;Therapeutic exercise;Neuromuscular re-education;Patient/family education;Manual techniques;Passive range of motion;Taping    PT Next Visit Plan  postural strength, manual therapy, thoracic extension.      PT Home Exercise Plan  Access Code: [email or web address]  Consulted and Agree with Plan of Care  Patient       Patient will benefit from skilled therapeutic intervention in order to improve the following deficits and impairments:  Pain, Increased fascial restricitons, Increased muscle spasms, Postural dysfunction, Decreased strength, Decreased range of motion  Visit Diagnosis: Muscle spasm of back  Other  muscle spasm  Cervicalgia  Chronic low back pain, unspecified back pain laterality, with sciatica presence unspecified     Problem List Patient Active Problem List   Diagnosis Date Noted  . Medication management 06/10/2017  . Anxiety 01/31/2017  . Bipolar disorder ([place]) 01/31/2017  . Depressive disorder 01/31/2017  . Iron deficiency anemia 05/11/2015  . Allergic sinusitis 06/05/2013  . Muscle spasms of neck trapezious 04/22/2012  . GERD (gastroesophageal reflux disease) 04/22/2012  . Drug interaction 04/22/2012  . BENIGN NEOPLASM OF SKIN SITE UNSPECIFIED 07/25/2009  . DEPRESSION 11/07/2007  . ALLERGIC RHINITIS 11/07/2007  . 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 GERD 11/07/2007    8:50 AM,09/11/17 Sherol Dade PT, DPT [place] [facility] [address], [place], [place], [address] Phone: [phone]   Fax:  [phone]  Name: Lisa Perkins MRN: [id number] Date of Birth: Sep 05, 1965

## 2017-09-13 ENCOUNTER — Encounter: Payer: Self-pay | Admitting: Physical Therapy

## 2017-09-13 ENCOUNTER — Ambulatory Visit: Payer: BLUE CROSS/BLUE SHIELD | Admitting: Physical Therapy

## 2017-09-13 DIAGNOSIS — M62838 Other muscle spasm: Secondary | ICD-10-CM

## 2017-09-13 DIAGNOSIS — M62830 Muscle spasm of back: Secondary | ICD-10-CM | POA: Diagnosis not present

## 2017-09-13 DIAGNOSIS — M542 Cervicalgia: Secondary | ICD-10-CM

## 2017-09-13 NOTE — Therapy (Signed)
[facility] [address] [place], [place], [address] Phone: [phone]   Fax:  [phone]  Physical Therapy Treatment  Patient Details  Name: Lisa Perkins MRN: [id number] Date of Birth: 06-15-1965 Referring Provider: Burnis Medin, MD    Encounter Date: 09/13/2017  PT End of Session - 09/13/17 0844    Visit Number  9    Date for PT Re-Evaluation  09/19/17    Authorization - Visit Number  9    Authorization - Number of Visits  60    PT Start Time  0803    PT Stop Time  1950    PT Time Calculation (min)  49 min    Activity Tolerance  Patient tolerated treatment well;No increased pain    Behavior During Therapy  WFL for tasks assessed/performed       Past Medical History:  Diagnosis Date  . Abnormal Pap smear of cervix    just a repeat done  . ALLERGIC RHINITIS 11/07/2007  . Asthma as child  . BENIGN NEOPLASM OF SKIN SITE UNSPECIFIED 07/25/2009  . DEPRESSION 11/07/2007  . Eczema   . GERD 11/07/2007   LPR - laryngopharyngeal reflux  . 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 TONSILLECTOMY AND ADENOIDECTOMY    . WISDOM TOOTH EXTRACTION      There were no vitals filed for this visit.  Subjective Assessment - 09/13/17 0811    Subjective  Pt reports that she is a little sore in her neck from the towel stretch.     Currently in Pain?  Yes    Pain Score  3     Pain Location  Neck    Pain Orientation  Right;Left;Lateral    Pain Descriptors / Indicators  Sore    Pain Type  Chronic pain    Pain Radiating Towards  none     Pain Onset  More than a month ago    Pain Frequency  Intermittent    Aggravating Factors   alot of driving and typing notes    Pain Relieving Factors  heat, rest          [facility] PT Assessment - 09/13/17 0001      AROM   Cervical - Right Rotation  60 soreness side of neck    Cervical - Left Rotation  60 soreness side of neck                    [facility] Adult PT Treatment/Exercise - 09/13/17 0001      Neck Exercises: Supine   Capital Flexion  10 reps;3 secs    Other Supine Exercise  deep cervical flexor/stabilizer activation with lateral flexion Lt and Rt x10 reps with heavy tactile cuing     Other Supine Exercise  shoulder protraction into horizontal abduction with blue TB x15 reps       Neck Exercises: Sidelying   Other Sidelying Exercise  shoulder abduction x15 reps with 2# dumbbell       Neck Exercises: Stabilization   Stabilization  quadruped neck retraction x15 reps; quadruped neck retraction hold with UE flexion x3 reps each       Moist Heat Therapy   Number Minutes Moist Heat  7 Minutes    Moist Heat Location  Cervical             PT Education - 09/13/17 0928    Education Details  technique with  therex    Person(s) Educated  Patient    Methods  Explanation;Verbal cues;Tactile cues    Comprehension  Returned demonstration;Verbalized understanding       PT Short Term Goals - 09/13/17 0836      PT SHORT TERM GOAL #1   Title  pt will be ind with initial HEP    Time  3    Period  Weeks    Status  Achieved      PT SHORT TERM GOAL #2   Title  The patient will have improved cervical sidebending to 40 degrees and rotation bilaterally with minimal increase in pain    Baseline  60 deg rotation    Time  3    Period  Weeks    Status  Partially Met      PT SHORT TERM GOAL #3   Title  The patient will report a 25% improvement in pain with using the computer at work    Baseline  no more numbness; atleast 30% improved     Time  3    Period  Weeks    Status  Achieved        PT Long Term Goals - 09/13/17 6629      PT LONG TERM GOAL #1   Title  The patient will be independent in safe self progression of HEP for further gains in ROM and strength      Time  6    Period  Weeks    Status  New      PT LONG TERM GOAL #2   Title  The patient will report a 50% or greater reduction in  symptoms when driving     Time  6    Period  Weeks    Status  New      PT LONG TERM GOAL #3   Title  The patient will have deep cervical muscle endurance needed for maintaining better posture while working and driving for at least one hour at a time.    Time  6    Period  Weeks    Status  New      PT LONG TERM GOAL #4   Title  The patient will have improved scapular mobility in order to reduce strain on neck when managing her dog    Baseline  unable to lift scapula away from ribcage    Time  6    Period  Weeks    Status  New      PT LONG TERM GOAL #5   Title  FOTO functional outcome score improved to 35% or less, indicating decreased pain and improved function    Time  6    Period  Weeks    Status  New            Plan - 09/13/17 0845    Clinical Impression Statement  Pt reports resolved Rt sided numbness of the neck. She has atleast 60 deg of cervical rotation each direction. Pt has difficulty maintaining neck stability during combined movements in quadruped and also demonstrates B shoulder weakness with elevation, requiring increased upper trap recruitment with this. Ended session with no increase in soreness. Will complete reassessment at next visit to determine need for extension of POC.     Rehab Potential  Excellent    PT Frequency  2x / week    PT Duration  6 weeks    PT Treatment/Interventions  ADLs/Self Care Home Management;Biofeedback;Cryotherapy;Electrical Stimulation;Moist Heat;Ultrasound;Therapeutic activities;Therapeutic exercise;Neuromuscular re-education;Patient/family  education;Manual techniques;Passive range of motion;Taping    PT Next Visit Plan  reassessment    PT Home Exercise Plan  Access Code: [email or web address]    Consulted and Agree with Plan of Care  Patient       Patient will benefit from skilled therapeutic intervention in order to improve the following deficits and impairments:  Pain, Increased fascial restricitons, Increased muscle spasms, Postural  dysfunction, Decreased strength, Decreased range of motion  Visit Diagnosis: Muscle spasm of back  Other muscle spasm  Cervicalgia     Problem List Patient Active Problem List   Diagnosis Date Noted  . Medication management 06/10/2017  . Anxiety 01/31/2017  . Bipolar disorder ([place]) 01/31/2017  . Depressive disorder 01/31/2017  . Iron deficiency anemia 05/11/2015  . Allergic sinusitis 06/05/2013  . Muscle spasms of neck trapezious 04/22/2012  . GERD (gastroesophageal reflux disease) 04/22/2012  . Drug interaction 04/22/2012  . BENIGN NEOPLASM OF SKIN SITE UNSPECIFIED 07/25/2009  . DEPRESSION 11/07/2007  . ALLERGIC RHINITIS 11/07/2007  . 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 GERD 11/07/2007    9:29 AM,09/13/17 Sherol Dade PT, DPT [place] [facility] [address], [place], [place], [address] Phone: [phone]   Fax:  [phone]  Name: RHIA BLATCHFORD MRN: [id number] Date of Birth: 10-Sep-1965

## 2017-09-17 ENCOUNTER — Ambulatory Visit: Payer: BLUE CROSS/BLUE SHIELD

## 2017-09-18 ENCOUNTER — Encounter: Payer: Self-pay | Admitting: Physical Therapy

## 2017-09-18 ENCOUNTER — Ambulatory Visit: Payer: BLUE CROSS/BLUE SHIELD | Admitting: Physical Therapy

## 2017-09-18 DIAGNOSIS — M62830 Muscle spasm of back: Secondary | ICD-10-CM

## 2017-09-18 DIAGNOSIS — M542 Cervicalgia: Secondary | ICD-10-CM

## 2017-09-18 DIAGNOSIS — G8929 Other chronic pain: Secondary | ICD-10-CM

## 2017-09-18 DIAGNOSIS — M62838 Other muscle spasm: Secondary | ICD-10-CM

## 2017-09-18 DIAGNOSIS — M545 Low back pain: Secondary | ICD-10-CM

## 2017-09-18 NOTE — Patient Instructions (Signed)
Access Code: [email or web address]  URL: [email or web address]  Date: 09/18/2017  Prepared by: Elly Modena   Exercises  Single Arm Doorway Pec Stretch at 90 Degrees Abduction - 3 reps - 1 sets - 30 sec hold - 1x daily - 7x weekly  Supine Shoulder Horizontal Abduction with Resistance - 15 reps - 3 sets - 1x daily - 7x weekly  Seated Shoulder Diagonal Pulls with Resistance - 15 reps - 1x daily - 7x weekly  Standing Bent Over Single Arm Scapular Row with Table Support - 10 reps - 3 sets - 1x daily - 7x weekly  Seated Serratus Press with Anchored Resistance - 15 reps - 3 sets - 1x daily - 7x weekly    [facility] [address], [place], [place] [address] Phone # [phone] Fax [phone]

## 2017-09-18 NOTE — Therapy (Signed)
[facility] [address] [place], [place], [address] Phone: [phone]   Fax:  [phone]  Physical Therapy Treatment/re-eval  Patient Details  Name: Lisa Perkins MRN: [id number] Date of Birth: Dec 04, 1965 Referring Provider: Shanon Ace, MD    Encounter Date: 09/18/2017  PT End of Session - 09/18/17 0849    Visit Number  11    Date for PT Re-Evaluation  10/21/17    Authorization Time Period  09/20/17 to 10/21/17    Authorization - Visit Number  11    Authorization - Number of Visits  60    PT Start Time  0801    PT Stop Time  0845    PT Time Calculation (min)  44 min    Activity Tolerance  Patient tolerated treatment well;No increased pain    Behavior During Therapy  WFL for tasks assessed/performed       Past Medical History:  Diagnosis Date  . Abnormal Pap smear of cervix    just a repeat done  . ALLERGIC RHINITIS 11/07/2007  . Asthma as child  . BENIGN NEOPLASM OF SKIN SITE UNSPECIFIED 07/25/2009  . DEPRESSION 11/07/2007  . Eczema   . GERD 11/07/2007   LPR - laryngopharyngeal reflux  . 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 TONSILLECTOMY AND ADENOIDECTOMY    . WISDOM TOOTH EXTRACTION      There were no vitals filed for this visit.  Subjective Assessment - 09/18/17 0804    Subjective  Pt reports that she is doing well. Her exercises are going ok and she was not too sore after her last session. She feels atleast 40% improved overall since beginning PT, but her upper back still has fatigue by the end of the day.     Currently in Pain?  No/denies unless she turns her head     Pain Onset  More than a month ago         [facility] PT Assessment - 09/18/17 0001      Assessment   Medical Diagnosis  M62.838 (ICD-10-CM) - Muscle spasms of neck    Referring Provider  Shanon Ace, MD     Onset Date/Surgical Date  -- flare up 2-3 months ago    Hand Dominance  Right    Prior Therapy  Yes      Precautions   Precautions  None      Restrictions   Weight Bearing Restrictions  No      Home Environment   Living Environment  Private residence    Living Arrangements  Spouse/significant other      Prior Function   Level of Independence  Independent    Vocation  Full time employment    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      Cognition   Overall Cognitive Status  Within Functional Limits for tasks assessed      Observation/Other Assessments   Focus on Therapeutic Outcomes (FOTO)   31% limited       AROM   Overall AROM Comments  thoracic rotation 45 deg Lt and Rt     Cervical Flexion  60 pain end range     Cervical Extension  70    Cervical - Right Side Bend  40 pain Lt side of neck    Cervical - Left Side Bend  45 pain Lt side of neck     Cervical - Right Rotation  50 pain  in lower cervical on left    Cervical - Left Rotation  60 pain in lower cervical on left    Lumbar Flexion  80%    Lumbar Extension  80%    Lumbar - Right Side Bend  WNL    Lumbar - Left Side Bend  WNL    Lumbar - Right Rotation  WNL    Lumbar - Left Rotation  WNL      Strength   Overall Strength Comments  B shoulder 4/5 MMT, B shoulder ER 4/5 MMT       Palpation   Palpation comment  tenderness along Lt and Rt upper trap, levator scap      Special Tests    Special Tests  --    Cervical Tests  --      Spurling's   Findings  --      Distraction Test   Findngs  --    Comment  --      Ambulation/Gait   Gait Pattern  Within Functional Limits                   [facility] Adult PT Treatment/Exercise - 09/18/17 0001      Neck Exercises: Standing   Other Standing Exercises  single arm rows with 5# dumbbell x10 reps each, heavy cuing initially for scap retraction              PT Education - 09/18/17 0848    Education Details  Technique with therex; car seat position and posture; set up and position of bookbag to decrease strain on the neck     Person(s) Educated  Patient    Methods  Explanation;Verbal cues;Handout;Demonstration    Comprehension  Verbalized understanding;Returned demonstration       PT Short Term Goals - 09/18/17 0842      PT SHORT TERM GOAL #1   Title  pt will be ind with initial HEP    Time  3    Period  Weeks    Status  Achieved      PT SHORT TERM GOAL #2   Title  The patient will have improved cervical sidebending to 40 degrees and rotation bilaterally with minimal increase in pain    Baseline  60 deg rotation, 35 deg lateral flexion (+) pain    Time  3    Period  Weeks    Status  Partially Met      PT SHORT TERM GOAL #3   Title  The patient will report a 25% improvement in pain with using the computer at work    Baseline  no more numbness; atleast 40% improved     Time  3    Period  Weeks    Status  Achieved        PT Long Term Goals - 09/18/17 6979      PT LONG TERM GOAL #1   Title  The patient will be independent in safe self progression of HEP for further gains in ROM and strength      Time  6    Period  Weeks    Status  New      PT LONG TERM GOAL #2   Title  The patient will report a 50% or greater reduction in symptoms when driving     Time  6    Period  Weeks    Status  New      PT LONG TERM GOAL #3   Title  The patient will have deep cervical muscle endurance needed for maintaining better posture while working and driving for at least one hour at a time.    Time  6    Period  Weeks    Status  New      PT LONG TERM GOAL #4   Title  The patient will have improved scapular mobility in order to reduce strain on neck when managing her dog    Baseline  unable to lift scapula away from ribcage    Time  6    Period  Weeks    Status  New      PT LONG TERM GOAL #5   Title  FOTO functional outcome score improved to 35% or less, indicating decreased pain and improved function    Time  6    Period  Weeks    Status  New            Plan - 09/18/17 0849    Clinical  Impression Statement  Pt is making steady progress towards her goals, demonstrating improved cervical rotation to the Lt and Rt, increased shoulder flexion strength to 5/5 MMT and reports of improved cervical pain and stiffness by atleast 40% since beginning PT. She has met all of her short term goals, but continues to have issues with pain and stiffness at the end of the work day. She has limited lateral cervical flexion and pain with cervical rotation, along with palpable tenderness and trigger points along the levator scapulae and upper trap.  Therapist assessed pt's posture and made several adjustments to ensure that her neck and upper back strain is decreased throughout the day. Pt would continue to benefit from skilled PT intervention to decrease muscle spasm, improve shoulder and neck strength to facilitate full return to long work days without limitation.     Rehab Potential  Excellent    PT Frequency  2x / week    PT Duration  6 weeks    PT Treatment/Interventions  ADLs/Self Care Home Management;Biofeedback;Cryotherapy;Electrical Stimulation;Moist Heat;Ultrasound;Therapeutic activities;Therapeutic exercise;Neuromuscular re-education;Patient/family education;Manual techniques;Passive range of motion;Taping    PT Next Visit Plan  lateral cervical glide; STM upper trap/levator scap; middle and lower trap (prone); rows; f/u on car adjustment    PT Home Exercise Plan  Access Code: [email or web address]    Consulted and Agree with Plan of Care  Patient       Patient will benefit from skilled therapeutic intervention in order to improve the following deficits and impairments:  Pain, Increased fascial restricitons, Increased muscle spasms, Postural dysfunction, Decreased strength, Decreased range of motion  Visit Diagnosis: Muscle spasm of back - Plan: PT plan of care cert/re-cert  Other muscle spasm - Plan: PT plan of care cert/re-cert  Cervicalgia - Plan: PT plan of care cert/re-cert  Chronic low back  pain, unspecified back pain laterality, with sciatica presence unspecified - Plan: PT plan of care cert/re-cert     Problem List Patient Active Problem List   Diagnosis Date Noted  . Medication management 06/10/2017  . Anxiety 01/31/2017  . Bipolar disorder ([place]) 01/31/2017  . Depressive disorder 01/31/2017  . Iron deficiency anemia 05/11/2015  . Allergic sinusitis 06/05/2013  . Muscle spasms of neck trapezious 04/22/2012  . GERD (gastroesophageal reflux disease) 04/22/2012  . Drug interaction 04/22/2012  . BENIGN NEOPLASM OF SKIN SITE UNSPECIFIED 07/25/2009  . DEPRESSION 11/07/2007  . ALLERGIC RHINITIS 11/07/2007  . 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 GERD 11/07/2007    9:31 AM,09/18/17 Sherol Dade  PT, DPT [place] [facility] [place] [address] [place], [place], [address] Phone: [phone]   Fax:  [phone]  Name: Lisa Perkins MRN: [id number] Date of Birth: [birth year]

## 2017-09-20 ENCOUNTER — Ambulatory Visit: Payer: BLUE CROSS/BLUE SHIELD | Attending: Internal Medicine | Admitting: Physical Therapy

## 2017-09-20 ENCOUNTER — Encounter: Payer: Self-pay | Admitting: Physical Therapy

## 2017-09-20 DIAGNOSIS — G8929 Other chronic pain: Secondary | ICD-10-CM | POA: Diagnosis present

## 2017-09-20 DIAGNOSIS — M6281 Muscle weakness (generalized): Secondary | ICD-10-CM | POA: Diagnosis present

## 2017-09-20 DIAGNOSIS — M542 Cervicalgia: Secondary | ICD-10-CM

## 2017-09-20 DIAGNOSIS — M62830 Muscle spasm of back: Secondary | ICD-10-CM

## 2017-09-20 DIAGNOSIS — M62838 Other muscle spasm: Secondary | ICD-10-CM | POA: Diagnosis present

## 2017-09-20 DIAGNOSIS — M545 Low back pain: Secondary | ICD-10-CM | POA: Insufficient documentation

## 2017-09-20 NOTE — Therapy (Signed)
[facility] [address] [place], [place], [address] Phone: [phone]   Fax:  [phone]  Physical Therapy Treatment  Patient Details  Name: Lisa Perkins MRN: [id number] Date of Birth: Oct 06, 1965 Referring Provider: Shanon Ace, MD    Encounter Date: 09/20/2017  PT End of Session - 09/20/17 0824    Visit Number  12    Date for PT Re-Evaluation  10/21/17    Authorization Time Period  09/20/17 to 10/21/17    Authorization - Visit Number  12    Authorization - Number of Visits  60    PT Start Time  0800    PT Stop Time  0840    PT Time Calculation (min)  40 min    Activity Tolerance  Patient tolerated treatment well;No increased pain    Behavior During Therapy  WFL for tasks assessed/performed       Past Medical History:  Diagnosis Date  . Abnormal Pap smear of cervix    just a repeat done  . ALLERGIC RHINITIS 11/07/2007  . Asthma as child  . BENIGN NEOPLASM OF SKIN SITE UNSPECIFIED 07/25/2009  . DEPRESSION 11/07/2007  . Eczema   . GERD 11/07/2007   LPR - laryngopharyngeal reflux  . 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 TONSILLECTOMY AND ADENOIDECTOMY    . WISDOM TOOTH EXTRACTION      There were no vitals filed for this visit.  Subjective Assessment - 09/20/17 0801    Subjective  Pt reports that things are going well. She has been completing her new exercises. No pain currently.     Currently in Pain?  No/denies    Pain Onset  More than a month ago                       [facility] Adult PT Treatment/Exercise - 09/20/17 0001      Self-Care   Posture  book bag posture for work       Neck Exercises: Standing   Other Standing Exercises  LUE abduction with red TB to 90 deg abduction x10 reps       Neck Exercises: Prone   Other Prone Exercise  Blackburn 6's x10 reps each, Lt and Rt       Manual Therapy   Joint Mobilization  Grade III Lt and Rt  cervical lateral glides x2 bouts each C4 to C7    Myofascial Release  trigger point release levator scap Lt and Rt              PT Education - 09/20/17 0823    Education Details  technique with therex; bookbag set up for work     [employer]) Educated  Patient    Methods  Explanation;Demonstration    Comprehension  Verbalized understanding;Verbal cues required;Returned demonstration       PT Short Term Goals - 09/18/17 0842      PT SHORT TERM GOAL #1   Title  pt will be ind with initial HEP    Time  3    Period  Weeks    Status  Achieved      PT SHORT TERM GOAL #2   Title  The patient will have improved cervical sidebending to 40 degrees and rotation bilaterally with minimal increase in pain    Baseline  60 deg rotation, 35 deg lateral flexion (+) pain    Time  3  Period  Weeks    Status  Partially Met      PT SHORT TERM GOAL #3   Title  The patient will report a 25% improvement in pain with using the computer at work    Baseline  no more numbness; atleast 40% improved     Time  3    Period  Weeks    Status  Achieved        PT Long Term Goals - 09/18/17 4481      PT LONG TERM GOAL #1   Title  The patient will be independent in safe self progression of HEP for further gains in ROM and strength      Time  6    Period  Weeks    Status  New      PT LONG TERM GOAL #2   Title  The patient will report a 50% or greater reduction in symptoms when driving     Time  6    Period  Weeks    Status  New      PT LONG TERM GOAL #3   Title  The patient will have deep cervical muscle endurance needed for maintaining better posture while working and driving for at least one hour at a time.    Time  6    Period  Weeks    Status  New      PT LONG TERM GOAL #4   Title  The patient will have improved scapular mobility in order to reduce strain on neck when managing her dog    Baseline  unable to lift scapula away from ribcage    Time  6    Period  Weeks    Status  New       PT LONG TERM GOAL #5   Title  FOTO functional outcome score improved to 35% or less, indicating decreased pain and improved function    Time  6    Period  Weeks    Status  New            Plan - 09/20/17 8563    Clinical Impression Statement  Today's session focused on addressing pt's posture with her work bag. Pt has been making seat adjustments in her car which has helped with her usual soreness. Completed manual treatment to the cervical spine to improve mobility of the lower cervical segments and introduced posterior shoulder and scapula strengthening exercises in prone. Pt has increased difficulty maintaining trunk neutral during RUE elevation exercise in prone, secondary to lack of trunk stability and strength. Ended session without reports of increase in pain and pt demonstrated understanding of HEP additions.    Rehab Potential  Excellent    PT Frequency  2x / week    PT Duration  6 weeks    PT Treatment/Interventions  ADLs/Self Care Home Management;Biofeedback;Cryotherapy;Electrical Stimulation;Moist Heat;Ultrasound;Therapeutic activities;Therapeutic exercise;Neuromuscular re-education;Patient/family education;Manual techniques;Passive range of motion;Taping    PT Next Visit Plan  STM upper trap/levator scap; middle and lower trap (prone); rows; low trunk stability in rotation    PT Home Exercise Plan  Access Code: [email or web address]; [email or web address]    Consulted and Agree with Plan of Care  Patient       Patient will benefit from skilled therapeutic intervention in order to improve the following deficits and impairments:  Pain, Increased fascial restricitons, Increased muscle spasms, Postural dysfunction, Decreased strength, Decreased range of motion  Visit Diagnosis: Muscle spasm of back  Other muscle spasm  Cervicalgia  Chronic low back pain, unspecified back pain laterality, with sciatica presence unspecified     Problem List Patient Active Problem List   Diagnosis Date  Noted  . Medication management 06/10/2017  . Anxiety 01/31/2017  . Bipolar disorder ([place]) 01/31/2017  . Depressive disorder 01/31/2017  . Iron deficiency anemia 05/11/2015  . Allergic sinusitis 06/05/2013  . Muscle spasms of neck trapezious 04/22/2012  . GERD (gastroesophageal reflux disease) 04/22/2012  . Drug interaction 04/22/2012  . BENIGN NEOPLASM OF SKIN SITE UNSPECIFIED 07/25/2009  . DEPRESSION 11/07/2007  . ALLERGIC RHINITIS 11/07/2007  . 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 GERD 11/07/2007    8:44 AM,09/20/17 Sherol Dade PT, DPT [place] [facility] [address], [place], [place], [address] Phone: [phone]   Fax:  [phone]  Name: Lisa Perkins MRN: [id number] Date of Birth: 08-22-1965

## 2017-09-25 ENCOUNTER — Encounter: Payer: Self-pay | Admitting: Physical Therapy

## 2017-09-25 ENCOUNTER — Ambulatory Visit: Payer: BLUE CROSS/BLUE SHIELD | Admitting: Physical Therapy

## 2017-09-25 DIAGNOSIS — M62830 Muscle spasm of back: Secondary | ICD-10-CM | POA: Diagnosis not present

## 2017-09-25 DIAGNOSIS — G8929 Other chronic pain: Secondary | ICD-10-CM

## 2017-09-25 DIAGNOSIS — M545 Low back pain: Secondary | ICD-10-CM

## 2017-09-25 DIAGNOSIS — M542 Cervicalgia: Secondary | ICD-10-CM

## 2017-09-25 DIAGNOSIS — M62838 Other muscle spasm: Secondary | ICD-10-CM

## 2017-09-25 NOTE — Therapy (Signed)
[facility] [address] [place], [place], [address] Phone: [phone]   Fax:  [phone]  Physical Therapy Treatment  Patient Details  Name: MORNA FLUD MRN: [id number] Date of Birth: 1965/04/04 Referring Provider: Shanon Ace, MD    Encounter Date: 09/25/2017  PT End of Session - 09/25/17 0843    Visit Number  13    Date for PT Re-Evaluation  10/21/17    Authorization Time Period  09/20/17 to 10/21/17    Authorization - Visit Number  13    Authorization - Number of Visits  60    PT Start Time  0800    PT Stop Time  1173    PT Time Calculation (min)  43 min    Activity Tolerance  Patient tolerated treatment well;No increased pain    Behavior During Therapy  WFL for tasks assessed/performed       Past Medical History:  Diagnosis Date  . Abnormal Pap smear of cervix    just a repeat done  . ALLERGIC RHINITIS 11/07/2007  . Asthma as child  . BENIGN NEOPLASM OF SKIN SITE UNSPECIFIED 07/25/2009  . DEPRESSION 11/07/2007  . Eczema   . GERD 11/07/2007   LPR - laryngopharyngeal reflux  . 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 TONSILLECTOMY AND ADENOIDECTOMY    . WISDOM TOOTH EXTRACTION      There were no vitals filed for this visit.  Subjective Assessment - 09/25/17 0804    Subjective  Pt reports that she had a work meeting and had to sit for about 4 hours. This made her back a little sore. Her exercises are going well.     Currently in Pain?  Yes    Pain Score  3     Pain Location  Neck    Pain Orientation  Right;Left    Pain Descriptors / Indicators  Sore    Pain Type  Chronic pain    Pain Radiating Towards  none     Pain Onset  More than a month ago    Pain Frequency  Intermittent    Aggravating Factors   alot of sitting and driving     Pain Relieving Factors  heat, rest                        [facility] Adult PT Treatment/Exercise - 09/25/17 0001       Exercises   Exercises  Other Exercises    Other Exercises   dead bug attempt x5 reps, abdominal brace with bent knee 90/90 and alternating heel tap x8 reps; quadruped cat cow x10 reps; quadruped hip extension x10 reps each; quadruped UE extension x10 reps with tactile cues       Neck Exercises: Prone   Neck Retraction  10 reps;Limitations    Neck Retraction Limitations  with yellow TB x10 reps     Rows  10 reps;Weights    Rows Weights (lbs)  5    Other Prone Exercise  Lt and Rt UE Y's x5 reps for technique control       Manual Therapy   Myofascial Release  trigger point release Lt levator scap and rhomboids             PT Education - 09/25/17 0842    Education Details  Technique with therex    Person(s) Educated  Patient    Methods  Verbal cues;Tactile  cues;Explanation    Comprehension  Verbalized understanding;Returned demonstration       PT Short Term Goals - 09/18/17 9211      PT SHORT TERM GOAL #1   Title  pt will be ind with initial HEP    Time  3    Period  Weeks    Status  Achieved      PT SHORT TERM GOAL #2   Title  The patient will have improved cervical sidebending to 40 degrees and rotation bilaterally with minimal increase in pain    Baseline  60 deg rotation, 35 deg lateral flexion (+) pain    Time  3    Period  Weeks    Status  Partially Met      PT SHORT TERM GOAL #3   Title  The patient will report a 25% improvement in pain with using the computer at work    Baseline  no more numbness; atleast 40% improved     Time  3    Period  Weeks    Status  Achieved        PT Long Term Goals - 09/18/17 9417      PT LONG TERM GOAL #1   Title  The patient will be independent in safe self progression of HEP for further gains in ROM and strength      Time  6    Period  Weeks    Status  New      PT LONG TERM GOAL #2   Title  The patient will report a 50% or greater reduction in symptoms when driving     Time  6    Period  Weeks    Status  New       PT LONG TERM GOAL #3   Title  The patient will have deep cervical muscle endurance needed for maintaining better posture while working and driving for at least one hour at a time.    Time  6    Period  Weeks    Status  New      PT LONG TERM GOAL #4   Title  The patient will have improved scapular mobility in order to reduce strain on neck when managing her dog    Baseline  unable to lift scapula away from ribcage    Time  6    Period  Weeks    Status  New      PT LONG TERM GOAL #5   Title  FOTO functional outcome score improved to 35% or less, indicating decreased pain and improved function    Time  6    Period  Weeks    Status  New            Plan - 09/25/17 0844    Clinical Impression Statement  Pt arrived with soreness in her upper back following a long meeting at work yesterday. Pt had concerns about her technique with several exercises and therapist was able to review these with her, making several adjustments. Pt has difficulty with prone UE elevation secondary Lt shoulder weakness and poor trunk stability. This was also evident during quadruped UE reach, with increased trunk rotation with LUE reach primarily. Ended session with soft tissue mobilization to decrease trigger points throughout the middle traps and rhomboids. Pt reported no increase in pain end of session.    Rehab Potential  Excellent    PT Frequency  2x / week    PT Duration  6 weeks    PT Treatment/Interventions  ADLs/Self Care Home Management;Biofeedback;Cryotherapy;Electrical Stimulation;Moist Heat;Ultrasound;Therapeutic activities;Therapeutic exercise;Neuromuscular re-education;Patient/family education;Manual techniques;Passive range of motion;Taping    PT Next Visit Plan  dead bug progression to LE straight; quadruped UE/LE reach if able; STM upper trap/levator scap; middle and lower trap (prone); rows; low trunk stability in rotation    PT Home Exercise Plan  Access Code: [email or web address]; [email or web address]     Consulted and Agree with Plan of Care  Patient       Patient will benefit from skilled therapeutic intervention in order to improve the following deficits and impairments:  Pain, Increased fascial restricitons, Increased muscle spasms, Postural dysfunction, Decreased strength, Decreased range of motion  Visit Diagnosis: Muscle spasm of back  Cervicalgia  Chronic low back pain, unspecified back pain laterality, with sciatica presence unspecified  Other muscle spasm     Problem List Patient Active Problem List   Diagnosis Date Noted  . Medication management 06/10/2017  . Anxiety 01/31/2017  . Bipolar disorder ([place]) 01/31/2017  . Depressive disorder 01/31/2017  . Iron deficiency anemia 05/11/2015  . Allergic sinusitis 06/05/2013  . Muscle spasms of neck trapezious 04/22/2012  . GERD (gastroesophageal reflux disease) 04/22/2012  . Drug interaction 04/22/2012  . BENIGN NEOPLASM OF SKIN SITE UNSPECIFIED 07/25/2009  . DEPRESSION 11/07/2007  . ALLERGIC RHINITIS 11/07/2007  . 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 GERD 11/07/2007    8:58 AM,09/25/17 Sherol Dade PT, DPT [place] [facility] [address] [place], [place], [address] Phone: [phone]   Fax:  [phone]  Name: MEG NIEMEIER MRN: [id number] Date of Birth: Mar 22, 1965

## 2017-09-27 ENCOUNTER — Ambulatory Visit: Payer: BLUE CROSS/BLUE SHIELD | Admitting: Physical Therapy

## 2017-09-27 ENCOUNTER — Encounter: Payer: Self-pay | Admitting: Physical Therapy

## 2017-09-27 DIAGNOSIS — M542 Cervicalgia: Secondary | ICD-10-CM

## 2017-09-27 DIAGNOSIS — M62830 Muscle spasm of back: Secondary | ICD-10-CM | POA: Diagnosis not present

## 2017-09-27 DIAGNOSIS — M545 Low back pain: Secondary | ICD-10-CM

## 2017-09-27 DIAGNOSIS — G8929 Other chronic pain: Secondary | ICD-10-CM

## 2017-09-27 DIAGNOSIS — M62838 Other muscle spasm: Secondary | ICD-10-CM

## 2017-09-27 NOTE — Therapy (Signed)
[facility] [address] [place], [place], [address] Phone: [phone]   Fax:  [phone]  Physical Therapy Treatment  Patient Details  Name: Lisa Perkins MRN: [id number] Date of Birth: 07-09-1965 Referring Provider: Shanon Ace, MD    Encounter Date: 09/27/2017  PT End of Session - 09/27/17 0829    Visit Number  14    Date for PT Re-Evaluation  10/21/17    Authorization Time Period  09/20/17 to 10/21/17    Authorization - Visit Number  14    Authorization - Number of Visits  60    PT Start Time  0801    PT Stop Time  0840    PT Time Calculation (min)  39 min    Activity Tolerance  Patient tolerated treatment well;No increased pain    Behavior During Therapy  WFL for tasks assessed/performed       Past Medical History:  Diagnosis Date  . Abnormal Pap smear of cervix    just a repeat done  . ALLERGIC RHINITIS 11/07/2007  . Asthma as child  . BENIGN NEOPLASM OF SKIN SITE UNSPECIFIED 07/25/2009  . DEPRESSION 11/07/2007  . Eczema   . GERD 11/07/2007   LPR - laryngopharyngeal reflux  . 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 TONSILLECTOMY AND ADENOIDECTOMY    . WISDOM TOOTH EXTRACTION      There were no vitals filed for this visit.  Subjective Assessment - 09/27/17 0802    Subjective  Pt reports that she completed her HEP which has made her a little sore. She is concerned that she can not complete at sit up. Her upper neck is not too sore right now, her low back is the main thing.     Currently in Pain?  No/denies    Pain Onset  More than a month ago                       [facility] Adult PT Treatment/Exercise - 09/27/17 0001      Exercises   Other Exercises   quadruped LE extension x10 reps alternating      Neck Exercises: Machines for Strengthening   UBE (Upper Arm Bike)  L2 x2 min forward/backward, PT present to discuss progress       Neck  Exercises: Seated   Upper Extremity D2  10 reps;Flexion    Theraband Level (UE D2)  Level 2 (Red)   x5 reps green TB   Other Seated Exercise  shoulder Ws with red TB 2x15 reps       Neck Exercises: Supine   Other Supine Exercise  dead bug with LE extension x5 reps     Other Supine Exercise  over foam roll with horizontal abduction pec stretch x20 reps; low trunk rotation x10 reps each side       Neck Exercises: Sidelying   Other Sidelying Exercise  side plank hold on ellbow and knees (foam pad) x5 reps each              PT Education - 09/27/17 0827    Education Details  technique with therex    Person(s) Educated  Patient    Methods  Explanation;Verbal cues;Tactile cues    Comprehension  Verbalized understanding;Returned demonstration       PT Short Term Goals - 09/18/17 0842      PT SHORT TERM GOAL #1   Title  pt will be ind with initial HEP    Time  3    Period  Weeks    Status  Achieved      PT SHORT TERM GOAL #2   Title  The patient will have improved cervical sidebending to 40 degrees and rotation bilaterally with minimal increase in pain    Baseline  60 deg rotation, 35 deg lateral flexion (+) pain    Time  3    Period  Weeks    Status  Partially Met      PT SHORT TERM GOAL #3   Title  The patient will report a 25% improvement in pain with using the computer at work    Baseline  no more numbness; atleast 40% improved     Time  3    Period  Weeks    Status  Achieved        PT Long Term Goals - 09/18/17 2637      PT LONG TERM GOAL #1   Title  The patient will be independent in safe self progression of HEP for further gains in ROM and strength      Time  6    Period  Weeks    Status  New      PT LONG TERM GOAL #2   Title  The patient will report a 50% or greater reduction in symptoms when driving     Time  6    Period  Weeks    Status  New      PT LONG TERM GOAL #3   Title  The patient will have deep cervical muscle endurance needed for  maintaining better posture while working and driving for at least one hour at a time.    Time  6    Period  Weeks    Status  New      PT LONG TERM GOAL #4   Title  The patient will have improved scapular mobility in order to reduce strain on neck when managing her dog    Baseline  unable to lift scapula away from ribcage    Time  6    Period  Weeks    Status  New      PT LONG TERM GOAL #5   Title  FOTO functional outcome score improved to 35% or less, indicating decreased pain and improved function    Time  6    Period  Weeks    Status  New            Plan - 09/27/17 8588    Clinical Impression Statement  Today's session focused on therex to improve core stability and strength. Pt has weakness of the Lt obliques during UE exercises completed in previous sessions. Incorporated core stability exercises this session and pt had difficulty completing side plank on the Lt. Noted forward head compensation during several exercises and therapist made some adjustments with HEP technique to further improve lower and upper trunk strength. Pt reported no increase in shoulder/neck pain following today's session.     Rehab Potential  Excellent    PT Frequency  2x / week    PT Duration  6 weeks    PT Treatment/Interventions  ADLs/Self Care Home Management;Biofeedback;Cryotherapy;Electrical Stimulation;Moist Heat;Ultrasound;Therapeutic activities;Therapeutic exercise;Neuromuscular re-education;Patient/family education;Manual techniques;Passive range of motion;Taping    PT Next Visit Plan  dead bug progression to LE straight; quadruped UE/LE reach if able; STM upper trap/levator scap; middle and lower trap (prone); rows; low trunk  stability in rotation    PT Home Exercise Plan  Access Code: [email or web address]; [email or web address]    Consulted and Agree with Plan of Care  Patient       Patient will benefit from skilled therapeutic intervention in order to improve the following deficits and impairments:  Pain,  Increased fascial restricitons, Increased muscle spasms, Postural dysfunction, Decreased strength, Decreased range of motion  Visit Diagnosis: Muscle spasm of back  Cervicalgia  Chronic low back pain, unspecified back pain laterality, with sciatica presence unspecified  Other muscle spasm     Problem List Patient Active Problem List   Diagnosis Date Noted  . Medication management 06/10/2017  . Anxiety 01/31/2017  . Bipolar disorder ([place]) 01/31/2017  . Depressive disorder 01/31/2017  . Iron deficiency anemia 05/11/2015  . Allergic sinusitis 06/05/2013  . Muscle spasms of neck trapezious 04/22/2012  . GERD (gastroesophageal reflux disease) 04/22/2012  . Drug interaction 04/22/2012  . BENIGN NEOPLASM OF SKIN SITE UNSPECIFIED 07/25/2009  . DEPRESSION 11/07/2007  . ALLERGIC RHINITIS 11/07/2007  . 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 GERD 11/07/2007    8:46 AM,09/27/17 Sherol Dade PT, DPT [place] [facility] [address] [place], [place], [address] Phone: [phone]   Fax:  [phone]  Name: Lisa Perkins MRN: [id number] Date of Birth: 04-08-1965

## 2017-09-30 ENCOUNTER — Ambulatory Visit: Payer: BLUE CROSS/BLUE SHIELD

## 2017-09-30 DIAGNOSIS — M62830 Muscle spasm of back: Secondary | ICD-10-CM

## 2017-09-30 DIAGNOSIS — M542 Cervicalgia: Secondary | ICD-10-CM

## 2017-09-30 NOTE — Therapy (Signed)
[facility] [address], [place], [place], [address] Phone: [phone]   Fax:  [phone]  Physical Therapy Treatment  Patient Details  Name: Lisa Perkins MRN: [id number] Date of Birth: 01/30/1966 Referring Provider: Shanon Ace, MD    Encounter Date: 09/30/2017  PT End of Session - 09/30/17 1148    Visit Number  15    Date for PT Re-Evaluation  10/21/17    Authorization Time Period  09/20/17 to 10/21/17    Authorization - Visit Number  15    Authorization - Number of Visits  60    PT Start Time  0240    PT Stop Time  9735    PT Time Calculation (min)  44 min    Activity Tolerance  Patient tolerated treatment well;No increased pain    Behavior During Therapy  WFL for tasks assessed/performed       Past Medical History:  Diagnosis Date  . Abnormal Pap smear of cervix    just a repeat done  . ALLERGIC RHINITIS 11/07/2007  . Asthma as child  . BENIGN NEOPLASM OF SKIN SITE UNSPECIFIED 07/25/2009  . DEPRESSION 11/07/2007  . Eczema   . GERD 11/07/2007   LPR - laryngopharyngeal reflux  . 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 TONSILLECTOMY AND ADENOIDECTOMY    . WISDOM TOOTH EXTRACTION      There were no vitals filed for this visit.  Subjective Assessment - 09/30/17 1106    Subjective  I am feeling a lot better in my neck and upper back.  My low back has been hurting me more.      Patient Stated Goals  neck, shoulders, and upper back is the worst    Currently in Pain?  No/denies                       [facility] Adult PT Treatment/Exercise - 09/30/17 0001      Neck Exercises: Machines for Strengthening   UBE (Upper Arm Bike)  L2 x6 min forward/backward, PT present to discuss progress       Neck Exercises: Seated   Other Seated Exercise  seated on green ball: 3 way raises with1# weight- 2x10 resp      Neck Exercises: Supine   Other Supine Exercise   over foam roll with horizontal abduction pec stretch x3 reps, D2 and ER with green band      Manual Therapy   Joint Mobilization  PA mobs T3-7 grade 2-3               PT Short Term Goals - 09/30/17 1117      PT SHORT TERM GOAL #3   Title  The patient will report a 25% improvement in pain with using the computer at work    Status  Achieved        PT Long Term Goals - 09/30/17 [place] #1   Title  The patient will be independent in safe self progression of HEP for further gains in ROM and strength      Time  6    Period  Weeks    Status  On-going      PT LONG TERM GOAL #2   Title  The patient will report a 50% or greater reduction in symptoms when driving     Status  Achieved  PT LONG TERM GOAL #3   Title  The patient will have deep cervical muscle endurance needed for maintaining better posture while working and driving for at least one hour at a time.    Baseline  Pt remains weak and has limited endurance    Time  6    Period  Weeks    Status  On-going            Plan - 09/30/17 1122    Clinical Impression Statement  Pt reports 70% overall improvement in neck and thoracic pain since the start of care.  Pt performed theraband exercises on the foam roll today to engage her core stabilizers.  Pt required verbal and tactile cues for abdominal bracing with strength exercseis.  Pt continues to have limited postural endurance for sitting and standing long periods.  Pt will continue to benefit from skilled PT for for core strength, endurance, flexibility and manual to address trigger points and mobility.      Rehab Potential  Excellent    PT Frequency  2x / week    PT Duration  6 weeks    PT Treatment/Interventions  ADLs/Self Care Home Management;Biofeedback;Cryotherapy;Electrical Stimulation;Moist Heat;Ultrasound;Therapeutic activities;Therapeutic exercise;Neuromuscular re-education;Patient/family education;Manual techniques;Passive range of  motion;Taping    PT Next Visit Plan  trunk stability, poatural strength, soft tissue to neck/thoracic if needed, prone strength    PT Home Exercise Plan  Access Code: [place]; [email or web address]    Consulted and Agree with Plan of Care  Patient       Patient will benefit from skilled therapeutic intervention in order to improve the following deficits and impairments:  Pain, Increased fascial restricitons, Increased muscle spasms, Postural dysfunction, Decreased strength, Decreased range of motion  Visit Diagnosis: Muscle spasm of back  Cervicalgia     Problem List Patient Active Problem List   Diagnosis Date Noted  . Medication management 06/10/2017  . Anxiety 01/31/2017  . Bipolar disorder ([place]) 01/31/2017  . Depressive disorder 01/31/2017  . Iron deficiency anemia 05/11/2015  . Allergic sinusitis 06/05/2013  . Muscle spasms of neck trapezious 04/22/2012  . GERD (gastroesophageal reflux disease) 04/22/2012  . Drug interaction 04/22/2012  . BENIGN NEOPLASM OF SKIN SITE UNSPECIFIED 07/25/2009  . DEPRESSION 11/07/2007  . ALLERGIC RHINITIS 11/07/2007  . 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 GERD 11/07/2007     Sigurd Sos, PT 09/30/17 11:52 AM  [facility] [address], [place], [place], [address] Phone: [phone]   Fax:  [phone]  Name: Lisa Perkins MRN: [id number] Date of Birth: Jul 11, 1965

## 2017-10-02 ENCOUNTER — Ambulatory Visit: Payer: BLUE CROSS/BLUE SHIELD | Admitting: Physical Therapy

## 2017-10-02 DIAGNOSIS — M62838 Other muscle spasm: Secondary | ICD-10-CM

## 2017-10-02 DIAGNOSIS — M62830 Muscle spasm of back: Secondary | ICD-10-CM | POA: Diagnosis not present

## 2017-10-02 DIAGNOSIS — M545 Low back pain: Secondary | ICD-10-CM

## 2017-10-02 DIAGNOSIS — G8929 Other chronic pain: Secondary | ICD-10-CM

## 2017-10-02 DIAGNOSIS — M542 Cervicalgia: Secondary | ICD-10-CM

## 2017-10-02 NOTE — Therapy (Signed)
[facility] [address] [place], [place], [address] Phone: [phone]   Fax:  [phone]  Physical Therapy Treatment  Patient Details  Name: Lisa Perkins MRN: [id number] Date of Birth: 09-26-1965 Referring Provider: Shanon Ace, MD    Encounter Date: 10/02/2017  PT End of Session - 10/02/17 0812    Visit Number  16    Date for PT Re-Evaluation  10/21/17    Authorization Time Period  09/20/17 to 10/21/17    Authorization - Visit Number  16    Authorization - Number of Visits  60    PT Start Time  0802    PT Stop Time  4742    PT Time Calculation (min)  41 min    Activity Tolerance  Patient tolerated treatment well;No increased pain    Behavior During Therapy  WFL for tasks assessed/performed       Past Medical History:  Diagnosis Date  . Abnormal Pap smear of cervix    just a repeat done  . ALLERGIC RHINITIS 11/07/2007  . Asthma as child  . BENIGN NEOPLASM OF SKIN SITE UNSPECIFIED 07/25/2009  . DEPRESSION 11/07/2007  . Eczema   . GERD 11/07/2007   LPR - laryngopharyngeal reflux  . 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 TONSILLECTOMY AND ADENOIDECTOMY    . WISDOM TOOTH EXTRACTION      There were no vitals filed for this visit.  Subjective Assessment - 10/02/17 0804    Subjective  Pt reports that things are going great with her neck. Her low back seems to be bothering her some.     Patient Stated Goals  neck, shoulders, and upper back is the worst    Currently in Pain?  No/denies                       [facility] Adult PT Treatment/Exercise - 10/02/17 0001      Neck Exercises: Machines for Strengthening   UBE (Upper Arm Bike)  L3 x2 min forward/backward, PT present to discuss progress      Neck Exercises: Seated   Other Seated Exercise  trunk rotation with green TB x15 reps each side    Other Seated Exercise  seated 3 way shoulder elevation 2x10  reps with 2#      Neck Exercises: Supine   Other Supine Exercise  B knees to chest with LE on red physioball x15 reps       Neck Exercises: Sidelying   Other Sidelying Exercise  Lt and Rt shoulder abduction with yellow TB x15 reps each; Lt and Rt shoulder flexion with 2# dumbbell x10 reps each; side plank on incline (elbow/knees) x10 sec each side    Other Sidelying Exercise  Lt and Rt shoulder ER x15 reps into UE press towards ceiling x15 reps each              PT Education - 10/02/17 0812    Education Details  technique with therex    Person(s) Educated  Patient    Methods  Explanation;Verbal cues    Comprehension  Verbalized understanding;Returned demonstration       PT Short Term Goals - 09/30/17 1117      PT SHORT TERM GOAL #3   Title  The patient will report a 25% improvement in pain with using the computer at work    Status  Achieved  PT Long Term Goals - 09/30/17 1117      PT LONG TERM GOAL #1   Title  The patient will be independent in safe self progression of HEP for further gains in ROM and strength      Time  6    Period  Weeks    Status  On-going      PT LONG TERM GOAL #2   Title  The patient will report a 50% or greater reduction in symptoms when driving     Status  Achieved      PT LONG TERM GOAL #3   Title  The patient will have deep cervical muscle endurance needed for maintaining better posture while working and driving for at least one hour at a time.    Baseline  Pt remains weak and has limited endurance    Time  6    Period  Weeks    Status  On-going            Plan - 10/02/17 7510    Clinical Impression Statement  Pt reports no neck pain upon arrival, but notes some low back soreness with her exercises. She was able to complete UE strengthening exercises in sidelying and upright position this session without reports of increase in neck pain. Did note posterior shoulder fatigue during sidelying flexion with visible muscle  shaking. Ended with side plank on incline to incorporate closed chain shoulder and trunk stability, with pt able to hold up to 10 sec with verbal cues for proper alignment. Ended without increase in neck pain.    Rehab Potential  Excellent    PT Frequency  2x / week    PT Duration  6 weeks    PT Treatment/Interventions  ADLs/Self Care Home Management;Biofeedback;Cryotherapy;Electrical Stimulation;Moist Heat;Ultrasound;Therapeutic activities;Therapeutic exercise;Neuromuscular re-education;Patient/family education;Manual techniques;Passive range of motion;Taping    PT Next Visit Plan  trunk stability, poatural strength, soft tissue to neck/thoracic if needed, prone strength    PT Home Exercise Plan  Access Code: [place]; [email or web address] [email or web address]    Consulted and Agree with Plan of Care  Patient       Patient will benefit from skilled therapeutic intervention in order to improve the following deficits and impairments:  Pain, Increased fascial restricitons, Increased muscle spasms, Postural dysfunction, Decreased strength, Decreased range of motion  Visit Diagnosis: Muscle spasm of back  Cervicalgia  Chronic low back pain, unspecified back pain laterality, with sciatica presence unspecified  Other muscle spasm     Problem List Patient Active Problem List   Diagnosis Date Noted  . Medication management 06/10/2017  . Anxiety 01/31/2017  . Bipolar disorder ([place]) 01/31/2017  . Depressive disorder 01/31/2017  . Iron deficiency anemia 05/11/2015  . Allergic sinusitis 06/05/2013  . Muscle spasms of neck trapezious 04/22/2012  . GERD (gastroesophageal reflux disease) 04/22/2012  . Drug interaction 04/22/2012  . BENIGN NEOPLASM OF SKIN SITE UNSPECIFIED 07/25/2009  . DEPRESSION 11/07/2007  . ALLERGIC RHINITIS 11/07/2007  . 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 GERD 11/07/2007   8:45 AM,10/02/17 Sherol Dade PT, DPT [place] [facility] [address] [place], [place], [address] Phone: [phone]   Fax:  [phone]  Name: CIELLE AGUILA MRN: [id number] Date of Birth: 11-13-1965

## 2017-10-04 ENCOUNTER — Encounter: Payer: BLUE CROSS/BLUE SHIELD | Admitting: Physical Therapy

## 2017-10-09 ENCOUNTER — Telehealth: Payer: Self-pay

## 2017-10-09 ENCOUNTER — Encounter: Payer: Self-pay | Admitting: Physical Therapy

## 2017-10-09 ENCOUNTER — Telehealth: Payer: Self-pay | Admitting: Internal Medicine

## 2017-10-09 ENCOUNTER — Ambulatory Visit: Payer: BLUE CROSS/BLUE SHIELD | Admitting: Physical Therapy

## 2017-10-09 DIAGNOSIS — G8929 Other chronic pain: Secondary | ICD-10-CM

## 2017-10-09 DIAGNOSIS — M62838 Other muscle spasm: Secondary | ICD-10-CM

## 2017-10-09 DIAGNOSIS — M62830 Muscle spasm of back: Secondary | ICD-10-CM | POA: Diagnosis not present

## 2017-10-09 DIAGNOSIS — M542 Cervicalgia: Secondary | ICD-10-CM

## 2017-10-09 DIAGNOSIS — M549 Dorsalgia, unspecified: Secondary | ICD-10-CM

## 2017-10-09 DIAGNOSIS — M545 Low back pain: Secondary | ICD-10-CM

## 2017-10-09 MED ORDER — ALBUTEROL SULFATE HFA 108 (90 BASE) MCG/ACT IN AERS: 2.0000 | INHALATION_SPRAY | Freq: Four times a day (QID) | RESPIRATORY_TRACT | 0 refills | Status: DC | PRN

## 2017-10-09 NOTE — Telephone Encounter (Signed)
Copied from [place] [id number]. Topic: Quick Communication - See Telephone Encounter >> Oct 09, 2017  9:35 AM Mylinda Latina, NT wrote: CRM for notification. See Telephone encounter for: 10/09/17. Patient called and states she needs a refill of her albuterol (VENTOLIN HFA) 108 (90 Base) MCG/ACT inhaler  [facility] [address], [place] [phone] (Phone) [phone] (Fax)

## 2017-10-09 NOTE — Telephone Encounter (Signed)
Copied from [place] [id number]. Topic: Referral - Request >> Oct 09, 2017  9:32 AM Mylinda Latina, NT wrote: Reason for CRM: Patient called and states that she would like a order sent to [facility] for PT for her back. She states she has an order for her neck but her back is bothering her as well . Please call patient when this order is placed CB# [phone]

## 2017-10-09 NOTE — Therapy (Signed)
[facility] [address] [place], [place], [address] Phone: [phone]   Fax:  [phone]  Physical Therapy Treatment  Patient Details  Name: Lisa Perkins MRN: [id number] Date of Birth: 1965-09-29 Referring Provider: Shanon Ace, MD    Encounter Date: 10/09/2017  PT End of Session - 10/09/17 0826    Visit Number  17    Date for PT Re-Evaluation  10/21/17    Authorization Time Period  09/20/17 to 10/21/17    Authorization - Visit Number  17    Authorization - Number of Visits  60    PT Start Time  0803    PT Stop Time  0843    PT Time Calculation (min)  40 min    Activity Tolerance  Patient tolerated treatment well;No increased pain    Behavior During Therapy  WFL for tasks assessed/performed       Past Medical History:  Diagnosis Date  . Abnormal Pap smear of cervix    just a repeat done  . ALLERGIC RHINITIS 11/07/2007  . Asthma as child  . BENIGN NEOPLASM OF SKIN SITE UNSPECIFIED 07/25/2009  . DEPRESSION 11/07/2007  . Eczema   . GERD 11/07/2007   LPR - laryngopharyngeal reflux  . 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 TONSILLECTOMY AND ADENOIDECTOMY    . WISDOM TOOTH EXTRACTION      There were no vitals filed for this visit.  Subjective Assessment - 10/09/17 0804    Subjective  Pt reports that things are going well. Her neck is not bothering her much right now. Her upper back feels like it might be in a bit of a spasm right now.     Patient Stated Goals  neck, shoulders, and upper back is the worst    Currently in Pain?  No/denies   in and out of spasm                      [facility] Adult PT Treatment/Exercise - 10/09/17 0001      Neck Exercises: Standing   Other Standing Exercises  BUE pressdown with green TB x15 reps       Neck Exercises: Supine   Shoulder Flexion  Both;15 reps    Shoulder Flexion Limitations  red TB   moist heat on low  back    Other Supine Exercise  shoulder flexion with resisted horizontal abduction x10 reps (yellow TB)       Neck Exercises: Sidelying   Other Sidelying Exercise  Rt and Lt shoulder flexion x15 reps each       Manual Therapy   Soft tissue mobilization  STM Rt thoracic paraspinals/rhomboids      Neck Exercises: Stretches   Other Neck Stretches  Rt threading the needle     Other Neck Stretches  Rt thoracic stretch 5x10 sec             PT Education - 10/09/17 0828    Education Details  technique with therex    Person(s) Educated  Patient    Methods  Explanation;Handout;Verbal cues    Comprehension  Verbalized understanding;Returned demonstration       PT Short Term Goals - 09/30/17 1117      PT SHORT TERM GOAL #3   Title  The patient will report a 25% improvement in pain with using the computer at work    Status  Achieved  PT Long Term Goals - 09/30/17 1117      PT LONG TERM GOAL #1   Title  The patient will be independent in safe self progression of HEP for further gains in ROM and strength      Time  6    Period  Weeks    Status  On-going      PT LONG TERM GOAL #2   Title  The patient will report a 50% or greater reduction in symptoms when driving     Status  Achieved      PT LONG TERM GOAL #3   Title  The patient will have deep cervical muscle endurance needed for maintaining better posture while working and driving for at least one hour at a time.    Baseline  Pt remains weak and has limited endurance    Time  6    Period  Weeks    Status  On-going            Plan - 10/09/17 0845    Clinical Impression Statement  Pt noted Rt thoracic muscle spasm upon arrival, so therapist completed trigger point release to the area with resulted decrease in spasm reported by pt. Pt's shoulder strengthening exercises were progressed through her full available range and she was able to complete with minimal cues from therapist. Pt has noticed increase in low  back pain and her core weakness limits her ability to full complete shoulder strength and stability progressions. Therapist discussed possibly receiving order from PCP in order to further address this in the future. Pt verbalized understanding at this time.     Rehab Potential  Excellent    PT Frequency  2x / week    PT Duration  6 weeks    PT Treatment/Interventions  ADLs/Self Care Home Management;Biofeedback;Cryotherapy;Electrical Stimulation;Moist Heat;Ultrasound;Therapeutic activities;Therapeutic exercise;Neuromuscular re-education;Patient/family education;Manual techniques;Passive range of motion;Taping    PT Next Visit Plan  update supine shoulder strength; serratus exercise, soft tissue to neck/thoracic if needed, prone strength    PT Home Exercise Plan  Access Code: [place]; blackburn [email or web address]    Consulted and Agree with Plan of Care  Patient       Patient will benefit from skilled therapeutic intervention in order to improve the following deficits and impairments:  Pain, Increased fascial restricitons, Increased muscle spasms, Postural dysfunction, Decreased strength, Decreased range of motion  Visit Diagnosis: Muscle spasm of back  Cervicalgia  Chronic low back pain, unspecified back pain laterality, with sciatica presence unspecified  Other muscle spasm     Problem List Patient Active Problem List   Diagnosis Date Noted  . Medication management 06/10/2017  . Anxiety 01/31/2017  . Bipolar disorder ([place]) 01/31/2017  . Depressive disorder 01/31/2017  . Iron deficiency anemia 05/11/2015  . Allergic sinusitis 06/05/2013  . Muscle spasms of neck trapezious 04/22/2012  . GERD (gastroesophageal reflux disease) 04/22/2012  . Drug interaction 04/22/2012  . BENIGN NEOPLASM OF SKIN SITE UNSPECIFIED 07/25/2009  . DEPRESSION 11/07/2007  . ALLERGIC RHINITIS 11/07/2007  . 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 GERD 11/07/2007     8:50 AM,10/09/17 Sherol Dade PT, DPT [place] [facility] [address], [place], [place], [address] Phone: [phone]   Fax:  [phone]  Name: Lisa Perkins MRN: [id number] Date of Birth: Nov 11, 1965

## 2017-10-09 NOTE — Telephone Encounter (Signed)
Please advise Dr Panosh, thanks.   

## 2017-10-10 NOTE — Telephone Encounter (Signed)
Ok to proceed with requested order  Back pain

## 2017-10-11 ENCOUNTER — Encounter: Payer: Self-pay | Admitting: Physical Therapy

## 2017-10-11 ENCOUNTER — Ambulatory Visit: Payer: BLUE CROSS/BLUE SHIELD | Admitting: Physical Therapy

## 2017-10-11 DIAGNOSIS — G8929 Other chronic pain: Secondary | ICD-10-CM

## 2017-10-11 DIAGNOSIS — M545 Low back pain: Secondary | ICD-10-CM

## 2017-10-11 DIAGNOSIS — M62830 Muscle spasm of back: Secondary | ICD-10-CM

## 2017-10-11 DIAGNOSIS — M62838 Other muscle spasm: Secondary | ICD-10-CM

## 2017-10-11 DIAGNOSIS — M542 Cervicalgia: Secondary | ICD-10-CM

## 2017-10-11 NOTE — Telephone Encounter (Signed)
Referral placed for PT for back pain in addition to the order for her neck/shoulder that is already in the system.  Pt aware. Nothing further needed.

## 2017-10-11 NOTE — Addendum Note (Signed)
Addended by: Virl Cagey on: 10/11/2017 04:48 PM   Modules accepted: Orders

## 2017-10-11 NOTE — Therapy (Signed)
[facility] [address], [place], [place], [address] Phone: [phone]   Fax:  [phone]  Physical Therapy Treatment  Patient Details  Name: Lisa Perkins MRN: [id number] Date of Birth: 1965/07/31 Referring Provider: Shanon Ace, MD    Encounter Date: 10/11/2017  PT End of Session - 10/11/17 0842    Visit Number  18    Date for PT Re-Evaluation  10/21/17    Authorization Time Period  09/20/17 to 10/21/17    Authorization - Visit Number  18    Authorization - Number of Visits  60    PT Start Time  0800    PT Stop Time  0845    PT Time Calculation (min)  45 min    Activity Tolerance  Patient tolerated treatment well;No increased pain    Behavior During Therapy  WFL for tasks assessed/performed       Past Medical History:  Diagnosis Date  . Abnormal Pap smear of cervix    just a repeat done  . ALLERGIC RHINITIS 11/07/2007  . Asthma as child  . BENIGN NEOPLASM OF SKIN SITE UNSPECIFIED 07/25/2009  . DEPRESSION 11/07/2007  . Eczema   . GERD 11/07/2007   LPR - laryngopharyngeal reflux  . 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 TONSILLECTOMY AND ADENOIDECTOMY    . WISDOM TOOTH EXTRACTION      There were no vitals filed for this visit.  Subjective Assessment - 10/11/17 0804    Subjective  Pt reports that her exercises are going well. She mostly just has soreness from her exercises but no other pain.     Patient Stated Goals  neck, shoulders, and upper back is the worst    Currently in Pain?  No/denies                       [facility] Adult PT Treatment/Exercise - 10/11/17 0001      Self-Care   Other Self-Care Comments   set up of desk with tablet propped so that forearms can rest on the desk; decrease the need for shoulder elevation and maintain upright neck posture       Neck Exercises: Standing   Other Standing Exercises  BUE pressdown with green TB  2x10 reps       Neck Exercises: Seated   Other Seated Exercise  B serratus punch with green TB x15 reps, serratus press with upward rotation 30 deg x10 reps green TB and tactile cuing       Neck Exercises: Supine   Shoulder Flexion  Both;15 reps    Shoulder Flexion Limitations  red TB      Neck Exercises: Sidelying   Other Sidelying Exercise  Rt and Lt shoulder flexion x10 reps each alternating shoulder ER 2x10 reps (3#)              PT Education - 10/11/17 0841    Education Details  technique with therex; set up of work tablet to improve shoulder posture; update HEP    Person(s) Educated  Patient    Methods  Explanation;Verbal cues;Demonstration;Handout    Comprehension  Verbalized understanding;Returned demonstration       PT Short Term Goals - 09/30/17 1117      PT SHORT TERM GOAL #3   Title  The patient will report a 25% improvement in pain with using the computer at work    Status  Achieved  PT Long Term Goals - 09/30/17 1117      PT LONG TERM GOAL #1   Title  The patient will be independent in safe self progression of HEP for further gains in ROM and strength      Time  6    Period  Weeks    Status  On-going      PT LONG TERM GOAL #2   Title  The patient will report a 50% or greater reduction in symptoms when driving     Status  Achieved      PT LONG TERM GOAL #3   Title  The patient will have deep cervical muscle endurance needed for maintaining better posture while working and driving for at least one hour at a time.    Baseline  Pt remains weak and has limited endurance    Time  6    Period  Weeks    Status  On-going            Plan - 10/11/17 0845    Clinical Impression Statement  Pt arrived without neck pain, noting posterior shoulder soreness from HEP adherence. She has been having limitations with her recent change in work tablets, reporting being unable to hold her device longer than 1-2 minutes before noting shoulder fatigue.  Therapist discussed set up of desk to improve posture and UE mechanics and progressed pt's HEP to further promote shoulder strength and endurance. Pt had difficulty with sidelying flexion with 3# and was encouraged to decrease weight with this at home.     Rehab Potential  Excellent    PT Frequency  2x / week    PT Duration  6 weeks    PT Treatment/Interventions  ADLs/Self Care Home Management;Biofeedback;Cryotherapy;Electrical Stimulation;Moist Heat;Ultrasound;Therapeutic activities;Therapeutic exercise;Neuromuscular re-education;Patient/family education;Manual techniques;Passive range of motion;Taping    PT Next Visit Plan  serratus exercise; low/middle trap strength in standing; STM thoracic spine    PT Home Exercise Plan  Access Code: [email or web address]; blackburn 6s    Consulted and Agree with Plan of Care  Patient       Patient will benefit from skilled therapeutic intervention in order to improve the following deficits and impairments:  Pain, Increased fascial restricitons, Increased muscle spasms, Postural dysfunction, Decreased strength, Decreased range of motion  Visit Diagnosis: Muscle spasm of back  Cervicalgia  Chronic low back pain, unspecified back pain laterality, with sciatica presence unspecified  Other muscle spasm     Problem List Patient Active Problem List   Diagnosis Date Noted  . Medication management 06/10/2017  . Anxiety 01/31/2017  . Bipolar disorder ([place]) 01/31/2017  . Depressive disorder 01/31/2017  . Iron deficiency anemia 05/11/2015  . Allergic sinusitis 06/05/2013  . Muscle spasms of neck trapezious 04/22/2012  . GERD (gastroesophageal reflux disease) 04/22/2012  . Drug interaction 04/22/2012  . BENIGN NEOPLASM OF SKIN SITE UNSPECIFIED 07/25/2009  . DEPRESSION 11/07/2007  . ALLERGIC RHINITIS 11/07/2007  . 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 GERD 11/07/2007     8:52 AM,10/11/17 Sherol Dade PT, DPT [place] [facility] [address] [place], [place], [address] Phone: [phone]   Fax:  [phone]  Name: Lisa Perkins MRN: [id number] Date of Birth: 08/17/1965

## 2017-10-15 ENCOUNTER — Ambulatory Visit: Payer: BLUE CROSS/BLUE SHIELD | Admitting: Physical Therapy

## 2017-10-15 ENCOUNTER — Encounter: Payer: Self-pay | Admitting: Physical Therapy

## 2017-10-15 DIAGNOSIS — M545 Low back pain: Principal | ICD-10-CM

## 2017-10-15 DIAGNOSIS — G8929 Other chronic pain: Secondary | ICD-10-CM

## 2017-10-15 DIAGNOSIS — M62830 Muscle spasm of back: Secondary | ICD-10-CM

## 2017-10-15 DIAGNOSIS — M6281 Muscle weakness (generalized): Secondary | ICD-10-CM

## 2017-10-15 NOTE — Patient Instructions (Signed)
Access Code: [email or web address]  URL: [email or web address]  Date: 10/15/2017  Prepared by: Elly Modena   Exercises  . Supine Figure 4 Piriformis Stretch - 3 reps - 2 sets - 30 hold - 1x daily - 7x weekly  . Supine Lower Trunk Rotation - 15 reps - 2 sets - 1x daily - 7x weekly     [facility] [address], [place], [place] [address] Phone # [phone] Fax [phone]

## 2017-10-15 NOTE — Therapy (Signed)
[facility] [address] [place], [place], [address] Phone: [phone]   Fax:  [phone]  Physical Therapy Treatment/re-evaluation  Patient Details  Name: Lisa Perkins MRN: [id number] Date of Birth: 10-Nov-1965 Referring Provider: Shanon Ace, MD    Encounter Date: 10/15/2017  PT End of Session - 10/15/17 1029    Visit Number  19    Date for PT Re-Evaluation  11/29/17    Authorization Time Period  09/20/17 to 11/29/17    Authorization - Visit Number  19    Authorization - Number of Visits  60    PT Start Time  0802    PT Stop Time  1735    PT Time Calculation (min)  40 min    Activity Tolerance  Patient tolerated treatment well;No increased pain    Behavior During Therapy  WFL for tasks assessed/performed       Past Medical History:  Diagnosis Date  . Abnormal Pap smear of cervix    just a repeat done  . ALLERGIC RHINITIS 11/07/2007  . Asthma as child  . BENIGN NEOPLASM OF SKIN SITE UNSPECIFIED 07/25/2009  . DEPRESSION 11/07/2007  . Eczema   . GERD 11/07/2007   LPR - laryngopharyngeal reflux  . 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 TONSILLECTOMY AND ADENOIDECTOMY    . WISDOM TOOTH EXTRACTION      There were no vitals filed for this visit.  Subjective Assessment - 10/15/17 0805    Subjective  Pt reports that her neck is doing much better, she feels it is atleast 80% improved. She will still have some soreness in her neck with activity. She arrives with new order for her low back. This has been bothering her for years. She had little disc space between L5/S1 and was told this would eventually fuse together. 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 Her low back pain is primarily central to her low back. She denies and numbness and tingling. She had PT many years ago and it helped alot.     Limitations  Other (comment);Standing   bending over    Patient Stated Goals  neck, shoulders, and upper back is the worst    Currently in Pain?  Yes    Pain Score  5     Pain Location  Back    Pain Orientation  Right;Left;Lower    Pain Descriptors / Indicators  Aching    Pain Type  Chronic pain    Pain Radiating Towards  none     Pain Onset  More than a month ago    Aggravating Factors   alot of sitting, standing    Pain Relieving Factors  laying on either side, heat every evening          [facility] PT Assessment - 10/15/17 0001      Assessment   Medical Diagnosis  M62.838 (ICD-10-CM) - Muscle spasms of neck    Referring Provider  Shanon Ace, MD     Onset Date/Surgical Date  --   flare up 2-3 months ago   Hand Dominance  Right    Prior Therapy  Yes      Precautions   Precautions  None      Restrictions   Weight Bearing Restrictions  No      Balance Screen   Has the  patient fallen in the past 6 months  No    Has the patient had a decrease in activity level because of a fear of falling?   No    Is the patient reluctant to leave their home because of a fear of falling?   No      Home Film/video editor residence    Living Arrangements  Spouse/significant other      Prior Function   Level of Independence  Independent    Vocation  Full time employment    Vocation Requirements  traveling [place]   Overall Cognitive Status  Within Functional Limits for tasks assessed      Observation/Other Assessments   Focus on Therapeutic Outcomes (FOTO)   25% limited (neck)      ROM / Strength   AROM / PROM / Strength  PROM      AROM   Overall AROM Comments  thoracic rotation 45 deg Lt and Rt     Cervical Flexion  60    Cervical Extension  70    Cervical - Right Side Bend  45    Cervical - Left Side Bend  45    Cervical - Right Rotation  50    Cervical - Left Rotation  60    Lumbar Flexion  decreased lumbar curvature, pain coming  back up    Lumbar Extension  75% limited, pain end range     Lumbar - Right Side Bend  --    Lumbar - Left Side Bend  --    Lumbar - Right Rotation  limited, pain end range     Lumbar - Left Rotation  limited, pain end range       PROM   Overall PROM Comments  Rt and Lt hip IR strength; Rt and Lt hip IR 20 deg       Strength   Overall Strength Comments  shoulder flexion, abduction 5/5 MMT, shoulder ER 4/5 MMT      Palpation   Spinal mobility  hypomobile lumbar spine, tender along L3/L4     Palpation comment  --      Ambulation/Gait   Gait Pattern  Within Functional Limits                   [facility] Adult PT Treatment/Exercise - 10/15/17 0001      Exercises   Exercises  Lumbar      Neck Exercises: Seated   Other Seated Exercise  attempted seated lumbar extension MWM x5 reps, unable without pain       Lumbar Exercises: Stretches   Piriformis Stretch  2 reps;30 seconds    Piriformis Stretch Limitations  supine       Lumbar Exercises: Supine   Other Supine Lumbar Exercises  low trunk rotation x5 reps each direction, HEP demo       Manual Therapy   Joint Mobilization  Grade III-IV CPAs T8 to S1 x2 bouts     Soft tissue mobilization  STM lumbar parspinals             PT Education - 10/15/17 1029    Education Details  re-evaluation findings/POC; updated HEP and decrease neck/shoulder to 3-4 days a week     Person(s) Educated  Patient    Methods  Explanation;Handout;Verbal cues    Comprehension  Verbalized understanding;Returned demonstration       PT Short Term Goals - 10/15/17  1030      PT SHORT TERM GOAL #1   Title  pt will be ind with initial HEP    Time  3    Period  Weeks    Status  Achieved      PT SHORT TERM GOAL #2   Title  The patient will have improved cervical sidebending to 40 degrees and rotation bilaterally with minimal increase in pain    Baseline  60 deg rotation, 35 deg lateral flexion (+) pain    Time  3    Period  Weeks     Status  Partially Met      PT SHORT TERM GOAL #3   Title  The patient will report a 25% improvement in pain with using the computer at work    Baseline  80% improved    Time  3    Period  Weeks    Status  Achieved      PT SHORT TERM GOAL #4   Title  Pt will demo improved understand of proper lifting mechanics, evident by her ability to lift a small box from the floor x5 reps without the need for cuing from PT.     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     Baseline  80% improved neck    Status  Revised    Target Date  11/29/17      PT LONG TERM GOAL #3   Title  The patient will have deep cervical muscle endurance needed for maintaining better posture while working and driving for at least one hour at a time.    Baseline  Pt has good posture awareness throughout her sessions and has made necessary adjustments to her computer set up.     Time  6    Period  Weeks    Status  Achieved      PT LONG TERM GOAL #4   Title  Pt will demo improved trunk stability and strength, evident by her ability to complete deadbug exercise x5 reps with LE/UE extended without increase in lumbar lordosis or low back pain.     Time  6    Period  Weeks    Status  New    Target Date  11/29/17      PT LONG  TERM GOAL #5   Title  Pt will have 5/5 MMT  of the LEs to improve her safety and decrease strain on her low back with daily activity.    Time  6    Period  Weeks    Status  New    Target Date  11/29/17            Plan - 10/15/17 1030    Clinical Impression Statement  Pt has done well with her neck rehab, reporting atleast 80% improvement over all in her neck ROM, strength and activity tolerance. She has met nearly all of her short and long term goals for this. She has pain free cervical ROM, improved posture awareness, and has been consistently completing her exercise progressions over the past several weeks. Pt is agreeable to transition over to rehab for her low back. This is a chronic issue, and she has had good improvements from PT in the past. She demonstrates limitations in low thoracic and lumbar segmental mobility, with hinge in the lumbar spine during extension. In addition, she has poor abdominal strength and limitations in B hip strength and flexibility which is contributing to the strain on her low back throughout the day. Pt would benefit from an extension of skilled PT to address her limitations in lumbar ROM, LE strength, flexibility and abdominal strength to decrease her pain with daily activity.     Rehab Potential  Excellent    PT Frequency  2x / week    PT Duration  6 weeks    PT Treatment/Interventions  ADLs/Self Care Home Management;Biofeedback;Cryotherapy;Electrical Stimulation;Moist Heat;Ultrasound;Therapeutic activities;Therapeutic exercise;Neuromuscular re-education;Patient/family education;Manual techniques;Passive range of motion;Taping    PT Next Visit Plan  lumbar mobilization/lumbar mobility exercises; hip rotation mobility; introduce supine abdominal strengthening    PT Home Exercise Plan  Access Code: [email or web address]; [email or web address]    Consulted and Agree with Plan of Care  Patient       Patient will benefit from skilled therapeutic intervention in order to improve the  following deficits and impairments:  Pain, Increased fascial restricitons, Increased muscle spasms, Postural dysfunction, Decreased strength, Decreased range of motion  Visit Diagnosis: Chronic low back pain, unspecified back pain laterality, with sciatica presence unspecified  Muscle spasm of back  Muscle weakness (generalized)     Problem List Patient Active Problem List   Diagnosis Date Noted  . Medication management 06/10/2017  . Anxiety 01/31/2017  . Bipolar disorder ([place]) 01/31/2017  . Depressive disorder 01/31/2017  . Iron deficiency anemia 05/11/2015  . Allergic sinusitis 06/05/2013  . Muscle spasms of neck trapezious 04/22/2012  . GERD (gastroesophageal reflux disease) 04/22/2012  . Drug interaction 04/22/2012  . BENIGN NEOPLASM OF SKIN SITE UNSPECIFIED 07/25/2009  . DEPRESSION 11/07/2007  . ALLERGIC RHINITIS 11/07/2007  . 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 GERD 11/07/2007    10:44 AM,10/15/17 Sherol Dade PT, DPT [facility] [address], [place], [place], [address] Phone: [phone]   Fax:  [phone]  Name: Lisa Perkins MRN: [id number] Date of Birth: February 27, 1965

## 2017-10-16 ENCOUNTER — Encounter: Payer: BLUE CROSS/BLUE SHIELD | Admitting: Physical Therapy

## 2017-10-18 ENCOUNTER — Encounter: Payer: Self-pay | Admitting: Physical Therapy

## 2017-10-18 ENCOUNTER — Ambulatory Visit: Payer: BLUE CROSS/BLUE SHIELD | Admitting: Physical Therapy

## 2017-10-18 DIAGNOSIS — M62830 Muscle spasm of back: Secondary | ICD-10-CM

## 2017-10-18 DIAGNOSIS — M62838 Other muscle spasm: Secondary | ICD-10-CM

## 2017-10-18 DIAGNOSIS — M545 Low back pain: Principal | ICD-10-CM

## 2017-10-18 DIAGNOSIS — M542 Cervicalgia: Secondary | ICD-10-CM

## 2017-10-18 DIAGNOSIS — M6281 Muscle weakness (generalized): Secondary | ICD-10-CM

## 2017-10-18 DIAGNOSIS — G8929 Other chronic pain: Secondary | ICD-10-CM

## 2017-10-18 NOTE — Therapy (Signed)
[facility] [address] [place], [place], [address] Phone: [phone]   Fax:  [phone]  Physical Therapy Treatment  Patient Details  Name: Lisa Perkins MRN: [id number] Date of Birth: Jun 18, 1965 Referring Provider: Shanon Ace, MD    Encounter Date: 10/18/2017  PT End of Session - 10/18/17 0815    Visit Number  20    Date for PT Re-Evaluation  11/29/17    Authorization Time Period  09/20/17 to 11/29/17    Authorization - Visit Number  20    Authorization - Number of Visits  60    PT Start Time  0801    PT Stop Time  0841    PT Time Calculation (min)  40 min    Activity Tolerance  Patient tolerated treatment well;No increased pain    Behavior During Therapy  WFL for tasks assessed/performed       Past Medical History:  Diagnosis Date  . Abnormal Pap smear of cervix    just a repeat done  . ALLERGIC RHINITIS 11/07/2007  . Asthma as child  . BENIGN NEOPLASM OF SKIN SITE UNSPECIFIED 07/25/2009  . DEPRESSION 11/07/2007  . Eczema   . GERD 11/07/2007   LPR - laryngopharyngeal reflux  . 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 TONSILLECTOMY AND ADENOIDECTOMY    . WISDOM TOOTH EXTRACTION      There were no vitals filed for this visit.  Subjective Assessment - 10/18/17 0805    Subjective  Pt reports that her exercises are going well. She thinks her hips are a little sore from the stretching. She has had no issues with the exercises.     Limitations  Other (comment);Standing   bending over    Patient Stated Goals  neck, shoulders, and upper back is the worst    Pain Onset  More than a month ago                       [facility] Adult PT Treatment/Exercise - 10/18/17 0001      Exercises   Exercises  Lumbar      Lumbar Exercises: Stretches   Other Lumbar Stretch Exercise  B butterfly stretch with gentle overpressure 10x10 sec hold     Other Lumbar  Stretch Exercise  Standing Rt and Lt hip IR/ER with LE on mat table and gentle overpressure      Lumbar Exercises: Standing   Other Standing Lumbar Exercises  Lt/Rt hip IR/ER with LE on mat table x10 reps (yellow TB)      Lumbar Exercises: Supine   Clam  10 reps    Clam Limitations  red TB with abdominal brace    Heel Slides  15 reps    Heel Slides Limitations  increased difficulty with RLE end range extension, heavy cuing required              PT Education - 10/18/17 0839    Education Details  technique with therex; updated HEP    Person(s) Educated  Patient    Methods  Explanation;Handout;Verbal cues    Comprehension  Returned demonstration;Verbalized understanding       PT Short Term Goals - 10/15/17 1030      PT SHORT TERM GOAL #1   Title  pt will be ind with initial HEP    Time  3    Period  Weeks    Status  Achieved  PT SHORT TERM GOAL #2   Title  The patient will have improved cervical sidebending to 40 degrees and rotation bilaterally with minimal increase in pain    Baseline  60 deg rotation, 35 deg lateral flexion (+) pain    Time  3    Period  Weeks    Status  Partially Met      PT SHORT TERM GOAL #3   Title  The patient will report a 25% improvement in pain with using the computer at work    Baseline  80% improved    Time  3    Period  Weeks    Status  Achieved      PT SHORT TERM GOAL #4   Title  Pt will demo improved understand of proper lifting mechanics, evident by her ability to lift a small box from the floor x5 reps without the need for cuing from PT.     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     Baseline  80% improved neck    Status  Revised    Target Date  11/29/17      PT LONG TERM GOAL #3   Title  The patient will have deep cervical muscle endurance needed for maintaining better posture while working and driving for at least one hour at a time.    Baseline  Pt has good posture awareness throughout her sessions and has made necessary adjustments to her computer set up.     Time  6    Period  Weeks    Status  Achieved      PT LONG TERM GOAL #4   Title  Pt will demo improved trunk stability and strength, evident by her ability to complete deadbug exercise x5 reps with LE/UE extended without increase in lumbar lordosis or low back pain.     Time  6    Period  Weeks    Status  New    Target Date  11/29/17      PT LONG TERM GOAL #5   Title  Pt will have 5/5 MMT of the LEs to improve her safety and decrease strain on her low back with daily activity.    Time  6    Period  Weeks    Status  New    Target Date  11/29/17  Plan - 10/18/17 0841    Clinical Impression Statement  Today's session focused on progressing HEP to improve hip strength and deep abdominal activation. Pt does have hip muscle fatigue and initially had difficulty with properly activating her deep abdominals. End of session she demonstrated good understanding with HEP additions and reported no increase in low back pain.     Rehab Potential  Excellent    PT Frequency  2x / week    PT Duration  6 weeks    PT Treatment/Interventions  ADLs/Self Care Home Management;Biofeedback;Cryotherapy;Electrical Stimulation;Moist  Heat;Ultrasound;Therapeutic activities;Therapeutic exercise;Neuromuscular re-education;Patient/family education;Manual techniques;Passive range of motion;Taping    PT Next Visit Plan  lumbar mobilization/lumbar mobility exercises; hip rotation mobility; progress supine abdominal strengthening    PT Home Exercise Plan  Access Code: [email or web address]; [email or web address]    Consulted and Agree with Plan of Care  Patient       Patient will benefit from skilled therapeutic intervention in order to improve the following deficits and impairments:  Pain, Increased fascial restricitons, Increased muscle spasms, Postural dysfunction, Decreased strength, Decreased range of motion  Visit Diagnosis: Chronic low back pain, unspecified back pain laterality, with sciatica presence unspecified  Muscle spasm of back  Muscle weakness (generalized)  Cervicalgia  Other muscle spasm     Problem List Patient Active Problem List   Diagnosis Date Noted  . Medication management 06/10/2017  . Anxiety 01/31/2017  . Bipolar disorder ([place]) 01/31/2017  . Depressive disorder 01/31/2017  . Iron deficiency anemia 05/11/2015  . Allergic sinusitis 06/05/2013  . Muscle spasms of neck trapezious 04/22/2012  . GERD (gastroesophageal reflux disease) 04/22/2012  . Drug interaction 04/22/2012  . BENIGN NEOPLASM OF SKIN SITE UNSPECIFIED 07/25/2009  . DEPRESSION 11/07/2007  . ALLERGIC RHINITIS 11/07/2007  . 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 GERD 11/07/2007    8:44 AM,10/18/17 Sherol Dade PT, DPT [facility] [address] [place], [place], [address] Phone: [phone]   Fax:  [phone]  Name: Lisa Perkins MRN: [id number] Date of Birth: 1965-08-04

## 2017-10-24 ENCOUNTER — Ambulatory Visit: Payer: BLUE CROSS/BLUE SHIELD | Attending: Internal Medicine

## 2017-10-24 DIAGNOSIS — M62838 Other muscle spasm: Secondary | ICD-10-CM | POA: Insufficient documentation

## 2017-10-24 DIAGNOSIS — M62830 Muscle spasm of back: Secondary | ICD-10-CM | POA: Diagnosis present

## 2017-10-24 DIAGNOSIS — M542 Cervicalgia: Secondary | ICD-10-CM | POA: Diagnosis present

## 2017-10-24 DIAGNOSIS — M545 Low back pain: Secondary | ICD-10-CM | POA: Diagnosis present

## 2017-10-24 DIAGNOSIS — M6281 Muscle weakness (generalized): Secondary | ICD-10-CM | POA: Diagnosis present

## 2017-10-24 DIAGNOSIS — G8929 Other chronic pain: Secondary | ICD-10-CM | POA: Diagnosis present

## 2017-10-24 NOTE — Patient Instructions (Addendum)
   Lifting Principles  .Maintain proper posture and head alignment. .Slide object as close as possible before lifting. .Move obstacles out of the way. .Test before lifting; ask for help if too heavy. .Tighten stomach muscles without holding breath. .Use smooth movements; do not jerk. .Use legs to do the work, and pivot with feet. .Distribute the work load symmetrically and close to the center of trunk. .Push instead of pull whenever possible.   Squat down and hold basket close to stand. Use leg muscles to do the work.    Avoid twisting or bending back. Pivot around using foot movements, and bend at knees if needed when reaching for articles.        Getting Into / Out of Bed   Lower self to lie down on one side by raising legs and lowering head at the same time. Use arms to assist moving without twisting. Bend both knees to roll onto back if desired. To sit up, start from lying on side, and use same move-ments in reverse. Keep trunk aligned with legs.    Shift weight from front foot to back foot as item is lifted off shelf.    When leaning forward to pick object up from floor, extend one leg out behind. Keep back straight. Hold onto a sturdy support with other hand.      Sit upright, head facing forward. Try using a roll to support lower back. Keep shoulders relaxed, and avoid rounded back. Keep hips level with knees. Avoid crossing legs for long periods.  Seated hamstring stretch 3x20 seconds  sidelying clam with band: x10   [facility] [address] [place], [place] [address] Phone # [phone] Fax [phone]

## 2017-10-24 NOTE — Therapy (Signed)
[facility] [address], [place], [place], [address] Phone: [phone]   Fax:  [phone]  Physical Therapy Treatment  Patient Details  Name: Lisa Perkins MRN: [id number] Date of Birth: 1965/05/09 Referring Provider: Shanon Ace, MD    Encounter Date: 10/24/2017  PT End of Session - 10/24/17 0844    Visit Number  21    Date for PT Re-Evaluation  11/29/17    Authorization Time Period  09/20/17 to 11/29/17    Authorization - Visit Number  21    Authorization - Number of Visits  60    PT Start Time  0801    PT Stop Time  0846    PT Time Calculation (min)  45 min    Activity Tolerance  Patient tolerated treatment well;No increased pain    Behavior During Therapy  WFL for tasks assessed/performed       Past Medical History:  Diagnosis Date  . Abnormal Pap smear of cervix    just a repeat done  . ALLERGIC RHINITIS 11/07/2007  . Asthma as child  . BENIGN NEOPLASM OF SKIN SITE UNSPECIFIED 07/25/2009  . DEPRESSION 11/07/2007  . Eczema   . GERD 11/07/2007   LPR - laryngopharyngeal reflux  . 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 TONSILLECTOMY AND ADENOIDECTOMY    . WISDOM TOOTH EXTRACTION      There were no vitals filed for this visit.  Subjective Assessment - 10/24/17 0807    Subjective  I have been working on activating my core.  I did yoga over the weekend.      Currently in Pain?  Yes    Pain Score  3     Pain Location  Back    Pain Orientation  Right;Left;Lower    Pain Descriptors / Indicators  Aching    Pain Onset  More than a month ago    Pain Frequency  Intermittent    Aggravating Factors   sitting, standing, bending over and trying to get back up    Pain Relieving Factors  heat, lying on side                       [facility] Adult PT Treatment/Exercise - 10/24/17 0001      Exercises   Exercises  Knee/Hip;Lumbar      Lumbar Exercises:  Stretches   Active Hamstring Stretch  Left;Right;2 reps;20 seconds   seated and supine with strap     Lumbar Exercises: Supine   Clam  20 reps    Clam Limitations  red TB with abdominal brace      Knee/Hip Exercises: Stretches   Active Hamstring Stretch  Both;3 reps;20 seconds      Knee/Hip Exercises: Aerobic   Nustep  Level 2 x 8 minutes    PT present to discuss progress     Knee/Hip Exercises: Sidelying   Clams  x15 with red band      Manual Therapy   Joint Mobilization  Grade III-IV CPAs T8 to S1 x2 bouts              PT Education - 10/24/17 0837    Education Details  body mechanics education, seated hamstring stretch, sidelying clam     Person(s) Educated  Patient    Methods  Explanation;Demonstration    Comprehension  Verbalized understanding;Returned demonstration       PT Short Term Goals -  10/24/17 0844      PT SHORT TERM GOAL #4   Title  Pt will demo improved understand of proper lifting mechanics, evident by her ability to lift a small box from the floor x5 reps without the need for cuing from PT.     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     Baseline  80% improved neck    Status  Revised    Target Date  11/29/17      PT LONG TERM GOAL #3   Title  The patient will have deep cervical muscle endurance needed for maintaining better posture while working and driving for at least one hour at a time.    Baseline  Pt has good posture awareness throughout her sessions and has made necessary adjustments to her computer set up.     Time  6    Period  Weeks    Status   Achieved      PT LONG TERM GOAL #4   Title  Pt will demo improved trunk stability and strength, evident by her ability to complete deadbug exercise x5 reps with LE/UE extended without increase in lumbar lordosis or low back pain.     Time  6    Period  Weeks    Status  New    Target Date  11/29/17      PT LONG TERM GOAL #5   Title  Pt will have 5/5 MMT of the LEs to improve her safety and decrease strain on her low back with daily activity.    Time  6    Period  Weeks    Status  New    Target Date  11/29/17            Plan - 10/24/17 0824    Clinical Impression Statement  Pt continues to work on building HEP for hip flexibility and hip/core strength.  Pt has received body mechanics education to improve movement with daily tasks.  Pt required verbal and tactile cues for technique with core exercises.  Pt will continue to benefit from skilled PT to address chronic LBP and hip stiffness.      Rehab Potential  Excellent    PT Frequency  2x / week    PT Duration  6 weeks    PT Treatment/Interventions  ADLs/Self Care Home Management;Biofeedback;Cryotherapy;Electrical Stimulation;Moist Heat;Ultrasound;Therapeutic activities;Therapeutic exercise;Neuromuscular re-education;Patient/family education;Manual techniques;Passive range of motion;Taping    PT Next Visit Plan  lumbar mobilization/lumbar mobility exercises; hip rotation mobility; progress supine abdominal strengthening    PT Home Exercise Plan  Access Code: [email or web address]; [email or web address]    Consulted and Agree with Plan of Care  Patient       Patient will benefit from skilled therapeutic intervention in order to improve the following deficits and impairments:  Pain, Increased fascial restricitons, Increased muscle spasms, Postural dysfunction, Decreased strength, Decreased range of  motion  Visit Diagnosis: Chronic low back pain, unspecified back pain laterality, with sciatica presence unspecified  Muscle spasm of back  Muscle weakness  (generalized)     Problem List Patient Active Problem List   Diagnosis Date Noted  . Medication management 06/10/2017  . Anxiety 01/31/2017  . Bipolar disorder ([place]) 01/31/2017  . Depressive disorder 01/31/2017  . Iron deficiency anemia 05/11/2015  . Allergic sinusitis 06/05/2013  . Muscle spasms of neck trapezious 04/22/2012  . GERD (gastroesophageal reflux disease) 04/22/2012  . Drug interaction 04/22/2012  . BENIGN NEOPLASM OF SKIN SITE UNSPECIFIED 07/25/2009  . DEPRESSION 11/07/2007  . ALLERGIC RHINITIS 11/07/2007  . 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 GERD 11/07/2007    Sigurd Sos, PT 10/24/17 8:50 AM  [facility] [address], [place], [place], [address] Phone: [phone]   Fax:  [phone]  Name: Lisa Perkins MRN: [id number] Date of Birth: 17-May-1965

## 2017-10-29 ENCOUNTER — Ambulatory Visit: Payer: BLUE CROSS/BLUE SHIELD

## 2017-10-29 DIAGNOSIS — M62838 Other muscle spasm: Secondary | ICD-10-CM

## 2017-10-29 DIAGNOSIS — M6281 Muscle weakness (generalized): Secondary | ICD-10-CM

## 2017-10-29 DIAGNOSIS — M62830 Muscle spasm of back: Secondary | ICD-10-CM

## 2017-10-29 DIAGNOSIS — M542 Cervicalgia: Secondary | ICD-10-CM

## 2017-10-29 DIAGNOSIS — M545 Low back pain: Principal | ICD-10-CM

## 2017-10-29 DIAGNOSIS — G8929 Other chronic pain: Secondary | ICD-10-CM

## 2017-10-29 NOTE — Therapy (Signed)
[facility] [address] [place], [place], [address] Phone: [phone]   Fax:  [phone]  Physical Therapy Treatment  Patient Details  Name: Lisa Perkins MRN: [id number] Date of Birth: October 01, 1965 Referring Provider: Shanon Ace, MD    Encounter Date: 10/29/2017  PT End of Session - 10/29/17 0931    Visit Number  22    Date for PT Re-Evaluation  11/29/17    Authorization - Visit Number  22    Authorization - Number of Visits  60    PT Start Time  4665    PT Stop Time  9935    PT Time Calculation (min)  44 min    Activity Tolerance  Patient tolerated treatment well;No increased pain    Behavior During Therapy  WFL for tasks assessed/performed       Past Medical History:  Diagnosis Date  . Abnormal Pap smear of cervix    just a repeat done  . ALLERGIC RHINITIS 11/07/2007  . Asthma as child  . BENIGN NEOPLASM OF SKIN SITE UNSPECIFIED 07/25/2009  . DEPRESSION 11/07/2007  . Eczema   . GERD 11/07/2007   LPR - laryngopharyngeal reflux  . 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 TONSILLECTOMY AND ADENOIDECTOMY    . WISDOM TOOTH EXTRACTION      There were no vitals filed for this visit.  Subjective Assessment - 10/29/17 0850    Subjective  I am feeling good.  I got a new tablet for work.      Currently in Pain?  Yes    Pain Score  3     Pain Location  Back    Pain Orientation  Left;Right;Lower    Pain Descriptors / Indicators  Aching    Pain Type  Chronic pain    Pain Onset  More than a month ago    Pain Frequency  Intermittent    Aggravating Factors   sitting, standing, bending over     Pain Relieving Factors  heat, lying on side.                       [facility] Adult PT Treatment/Exercise - 10/29/17 0001      Exercises   Exercises  Knee/Hip;Lumbar      Lumbar Exercises: Sidelying   Other Sidelying Lumbar Exercises  open book stretch x15  minutes      Knee/Hip Exercises: Sidelying   Clams  x15 with red band      Manual Therapy   Joint Mobilization  Grade III-IV CPAs T8 to S1 x2 bouts              PT Education - 10/29/17 0905    Education Details  how to use tablet for work tasks with improved Programmer, systems) Educated  Patient    Methods  Explanation;Demonstration    Comprehension  Verbalized understanding;Returned demonstration       PT Short Term Goals - 10/24/17 0844      PT SHORT TERM GOAL #4   Title  Pt will demo improved understand of proper lifting mechanics, evident by her ability to lift a small box from the floor x5 reps without the need for cuing from PT.     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     Baseline  80% improved neck    Status  Revised    Target Date  11/29/17      PT LONG TERM GOAL #3   Title  The patient will have deep cervical muscle endurance needed for maintaining better posture while working and driving for at least one hour at a time.    Baseline  Pt has good posture awareness throughout her sessions and has made necessary adjustments to her computer set up.     Time  6    Period  Weeks    Status  Achieved      PT LONG TERM GOAL #4   Title  Pt will demo improved trunk stability and strength, evident by her ability to complete deadbug exercise x5 reps with LE/UE extended without increase in lumbar lordosis or low back pain.     Time  6    Period  Weeks    Status  New    Target Date  11/29/17      PT LONG TERM GOAL #5   Title  Pt will have 5/5 MMT of the LEs to improve her  safety and decrease strain on her low back with daily activity.    Time  6    Period  Weeks    Status  New    Target Date  11/29/17            Plan - 10/29/17 0915    Clinical Impression Statement   Pt continues to work on building HEP for hip flexibility and hip/core strength.  Pt has received body mechanics education to improve use of work tablet.  Pt is activating her core with movement ~25% of the time and requires frequent cues during treatment. Pt with fair abdominal activation and continues to work on this with HEP.  PT with reduced segmental mobility with PA mobs T8-S1 and reports reduced symptoms after manual therapy today.  Pt will continue to benefit from skilled PT to address chronic LBP and hip stiffness.    Rehab Potential  Excellent    PT Frequency  2x / week    PT Duration  6 weeks    PT Treatment/Interventions  ADLs/Self Care Home Management;Biofeedback;Cryotherapy;Electrical Stimulation;Moist Heat;Ultrasound;Therapeutic activities;Therapeutic exercise;Neuromuscular re-education;Patient/family education;Manual techniques;Passive range of motion;Taping    PT Next Visit Plan  lumbar mobilization/lumbar mobility exercises; hip rotation mobility; progress supine abdominal strengthening    PT Home Exercise Plan  Access Code: [email or web address]; [email or web address]    Consulted and Agree with Plan of Care  Patient       Patient will benefit from skilled therapeutic intervention in order to improve the following deficits and impairments:  Pain, Increased fascial restricitons, Increased muscle spasms, Postural dysfunction, Decreased strength, Decreased range of motion  Visit Diagnosis: Chronic low back pain, unspecified back pain laterality, with sciatica presence unspecified  Muscle spasm of back  Muscle weakness (generalized)  Cervicalgia  Other muscle spasm     Problem List Patient Active Problem List  Diagnosis Date Noted  . Medication management 06/10/2017  . Anxiety  01/31/2017  . Bipolar disorder ([place]) 01/31/2017  . Depressive disorder 01/31/2017  . Iron deficiency anemia 05/11/2015  . Allergic sinusitis 06/05/2013  . Muscle spasms of neck trapezious 04/22/2012  . GERD (gastroesophageal reflux disease) 04/22/2012  . Drug interaction 04/22/2012  . BENIGN NEOPLASM OF SKIN SITE UNSPECIFIED 07/25/2009  . DEPRESSION 11/07/2007  . ALLERGIC RHINITIS 11/07/2007  . 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 GERD 11/07/2007     Sigurd Sos, PT 10/29/17 9:33 AM  [facility] [address] [place], [place], [address] Phone: [phone]   Fax:  [phone]  Name: KELLIANNE EK MRN: [id number] Date of Birth: February 12, 1966

## 2017-11-01 ENCOUNTER — Ambulatory Visit: Payer: BLUE CROSS/BLUE SHIELD | Admitting: Physical Therapy

## 2017-11-01 ENCOUNTER — Encounter: Payer: Self-pay | Admitting: Physical Therapy

## 2017-11-01 DIAGNOSIS — M542 Cervicalgia: Secondary | ICD-10-CM

## 2017-11-01 DIAGNOSIS — M545 Low back pain: Principal | ICD-10-CM

## 2017-11-01 DIAGNOSIS — G8929 Other chronic pain: Secondary | ICD-10-CM

## 2017-11-01 DIAGNOSIS — M6281 Muscle weakness (generalized): Secondary | ICD-10-CM

## 2017-11-01 DIAGNOSIS — M62838 Other muscle spasm: Secondary | ICD-10-CM

## 2017-11-01 DIAGNOSIS — M62830 Muscle spasm of back: Secondary | ICD-10-CM

## 2017-11-01 NOTE — Therapy (Signed)
[facility] [address] [place], [place], [address] Phone: [phone]   Fax:  [phone]  Physical Therapy Treatment  Patient Details  Name: Lisa Perkins MRN: [id number] Date of Birth: [birth year] Referring Provider: Shanon Ace, MD    Encounter Date: 11/01/2017  PT End of Session - 11/01/17 0841    Visit Number  23    Date for PT Re-Evaluation  11/29/17    Authorization - Visit Number  23    Authorization - Number of Visits  60    PT Start Time  0800    PT Stop Time  7619    PT Time Calculation (min)  39 min    Activity Tolerance  Patient tolerated treatment well;No increased pain    Behavior During Therapy  WFL for tasks assessed/performed       Past Medical History:  Diagnosis Date  . Abnormal Pap smear of cervix    just a repeat done  . ALLERGIC RHINITIS 11/07/2007  . Asthma as child  . BENIGN NEOPLASM OF SKIN SITE UNSPECIFIED 07/25/2009  . DEPRESSION 11/07/2007  . Eczema   . GERD 11/07/2007   LPR - laryngopharyngeal reflux  . 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 TONSILLECTOMY AND ADENOIDECTOMY    . WISDOM TOOTH EXTRACTION      There were no vitals filed for this visit.  Subjective Assessment - 11/01/17 0801    Subjective  Pt reports that she is still trying to complete her exercises. The upper back is good, but her low back will still bother her some.     Currently in Pain?  Yes    Pain Score  3     Pain Location  Back    Pain Orientation  Lower;Right;Left    Pain Descriptors / Indicators  Aching    Pain Type  Chronic pain    Pain Radiating Towards  none     Pain Onset  More than a month ago    Pain Frequency  Intermittent    Aggravating Factors   sitting, standing, bending over     Pain Relieving Factors  heat, lying on side                       [facility] Adult PT Treatment/Exercise - 11/01/17 0001      Self-Care   Self-Care   Lifting    Lifting  squat and half kneel to stand for impoved stability and mechanics during the day      Exercises   Exercises  Lumbar      Lumbar Exercises: Stretches   Other Lumbar Stretch Exercise  lumbar rotation stretch seated in chair x10 sec each direction       Lumbar Exercises: Supine   Ab Set  5 reps;3 seconds    Bridge  10 reps    Straight Leg Raise  5 reps    Straight Leg Raises Limitations  abdominal brace and BUE pressdown    Other Supine Lumbar Exercises  Bent knee 90/90 with alternating heel tap onto 4" box x10 reps, heavy cuing to decrease "doming" of abdominals     Other Supine Lumbar Exercises  Bent knee 90/90 with hip abduction x10 reps each (PT initially providing tactile assistance for improved technique)       Lumbar Exercises: Sidelying   Clam  Right;10 reps    Clam Limitations  with UE press into  table       Manual Therapy   Joint Mobilization  Grade III Lt and Rt L4/L5 rotation mobilization x3 bouts each direction              PT Education - 11/01/17 0841    Education Details  technique with therex; lifting mechanics    Person(s) Educated  Patient    Methods  Explanation;Demonstration;Verbal cues    Comprehension  Returned demonstration;Verbalized understanding       PT Short Term Goals - 10/24/17 0844      PT SHORT TERM GOAL #4   Title  Pt will demo improved understand of proper lifting mechanics, evident by her ability to lift a small box from the floor x5 reps without the need for cuing from PT.     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      Baseline  80% improved neck    Status  Revised    Target Date  11/29/17      PT LONG TERM GOAL #3   Title  The patient will have deep cervical muscle endurance needed for maintaining better posture while working and driving for at least one hour at a time.    Baseline  Pt has good posture awareness throughout her sessions and has made necessary adjustments to her computer set up.     Time  6    Period  Weeks    Status  Achieved      PT LONG TERM GOAL #4   Title  Pt will demo improved trunk stability and strength, evident by her ability to complete deadbug exercise x5 reps with LE/UE extended without increase in lumbar lordosis or low back pain.     Time  6    Period  Weeks    Status  New    Target Date  11/29/17      PT LONG TERM GOAL #5   Title  Pt will have 5/5 MMT of the LEs to improve her safety and decrease strain on her low back with daily activity.    Time  6    Period  Weeks    Status  New    Target Date  11/29/17            Plan - 11/01/17 6378    Clinical Impression Statement  Pt continues to report dull ache in low back throughout the day. Session focused on progressing pt's deep abdominal strength and endurance. She has difficulty with maintaining rotation stability to the Rt more than the Lt, but she was able to complete today's exercise with intermittent therapist cuing.  Ended session with rotation mobilization of the lumbar spine and pt reported resolved pain end of session.  She verbalized understanding of self-care topics and HEP adjustments made this session.     Rehab Potential  Excellent    PT Frequency  2x / week    PT Duration  6 weeks    PT Treatment/Interventions  ADLs/Self Care Home Management;Biofeedback;Cryotherapy;Electrical Stimulation;Moist Heat;Ultrasound;Therapeutic activities;Therapeutic exercise;Neuromuscular re-education;Patient/family education;Manual techniques;Passive range of motion;Taping    PT Next Visit Plan  d/n lumbar spine; lumbar  mobilization/lumbar mobility exercises; hip rotation mobility; progress supine abdominal strengthening    PT Home Exercise Plan  Access Code: [email or web address]; [email or web address]    Consulted and Agree with Plan of Care  Patient       Patient will benefit from skilled therapeutic intervention in order to improve the following deficits and impairments:  Pain, Increased fascial restricitons, Increased muscle spasms, Postural dysfunction, Decreased strength, Decreased range of motion  Visit Diagnosis: Chronic low back pain, unspecified back pain laterality, with sciatica presence unspecified  Muscle spasm of back  Muscle weakness (generalized)  Cervicalgia  Other muscle spasm     Problem List Patient Active Problem List   Diagnosis Date Noted  . Medication management 06/10/2017  . Anxiety 01/31/2017  . Bipolar disorder ([place]) 01/31/2017  . Depressive disorder 01/31/2017  . Iron deficiency anemia 05/11/2015  . Allergic sinusitis 06/05/2013  . Muscle spasms of neck trapezious 04/22/2012  . GERD (gastroesophageal reflux disease) 04/22/2012  . Drug interaction 04/22/2012  . BENIGN NEOPLASM OF SKIN SITE UNSPECIFIED 07/25/2009  . DEPRESSION 11/07/2007  . ALLERGIC RHINITIS 11/07/2007  . 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 GERD 11/07/2007    8:45 AM,11/01/17 Sherol Dade PT, DPT [facility] [address] [place], [place], [address] Phone: [phone]   Fax:  [phone]  Name: Lisa Perkins MRN: [id number] Date of Birth: 09/02/1965

## 2017-11-05 ENCOUNTER — Encounter: Payer: Self-pay | Admitting: Physical Therapy

## 2017-11-05 ENCOUNTER — Ambulatory Visit: Payer: BLUE CROSS/BLUE SHIELD | Admitting: Physical Therapy

## 2017-11-05 DIAGNOSIS — M542 Cervicalgia: Secondary | ICD-10-CM

## 2017-11-05 DIAGNOSIS — M545 Low back pain: Secondary | ICD-10-CM | POA: Diagnosis not present

## 2017-11-05 DIAGNOSIS — M62830 Muscle spasm of back: Secondary | ICD-10-CM

## 2017-11-05 DIAGNOSIS — M6281 Muscle weakness (generalized): Secondary | ICD-10-CM

## 2017-11-05 DIAGNOSIS — M62838 Other muscle spasm: Secondary | ICD-10-CM

## 2017-11-05 DIAGNOSIS — G8929 Other chronic pain: Secondary | ICD-10-CM

## 2017-11-05 NOTE — Therapy (Signed)
[facility] [address] [place], [place], [address] Phone: [phone]   Fax:  [phone]  Physical Therapy Treatment  Patient Details  Name: Lisa Perkins MRN: [id number] Date of Birth: 09-28-1965 Referring Provider: Shanon Ace, MD    Encounter Date: 11/05/2017  PT End of Session - 11/05/17 0806    Visit Number  24    Date for PT Re-Evaluation  11/29/17    Authorization - Visit Number  24    Authorization - Number of Visits  60    PT Start Time  0800    PT Stop Time  8366    PT Time Calculation (min)  44 min    Activity Tolerance  Patient tolerated treatment well;No increased pain    Behavior During Therapy  WFL for tasks assessed/performed       Past Medical History:  Diagnosis Date  . Abnormal Pap smear of cervix    just a repeat done  . ALLERGIC RHINITIS 11/07/2007  . Asthma as child  . BENIGN NEOPLASM OF SKIN SITE UNSPECIFIED 07/25/2009  . DEPRESSION 11/07/2007  . Eczema   . GERD 11/07/2007   LPR - laryngopharyngeal reflux  . 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 TONSILLECTOMY AND ADENOIDECTOMY    . WISDOM TOOTH EXTRACTION      There were no vitals filed for this visit.  Subjective Assessment - 11/05/17 0802    Subjective  Pt reports that things are going well. She has been continuing to work on her HEP which is still difficult. Pain is a dull ache.     Currently in Pain?  Yes    Pain Score  4     Pain Location  Back    Pain Orientation  Right;Left;Lower    Pain Descriptors / Indicators  Aching    Pain Type  Chronic pain    Pain Radiating Towards  none     Pain Onset  More than a month ago    Pain Frequency  Intermittent    Aggravating Factors   alot of activity     Pain Relieving Factors  heat, laying on side                       [facility] Adult PT Treatment/Exercise - 11/05/17 0001      Exercises   Exercises  Lumbar      Lumbar Exercises: Seated   Other Seated Lumbar Exercises  abdominal brace with BUE press in foam roll and breathing coordination x15 reps       Lumbar Exercises: Supine   Ab Set  5 reps;Other (comment)   x10 sec hold    Other Supine Lumbar Exercises  hooklying BUE pressdown to 90 deg with LE march x10 reps each, x5 reps on Lt with heavy tactile cuing      Other Supine Lumbar Exercises  hooklying alternating UE flexion with yellow TB x10 reps (abdominal brace)      Manual Therapy   Joint Mobilization  Grade III Lt and Rt L4/L5 rotation mobilization x3 bouts each direction     Soft tissue mobilization  STM lumbar paraspinals       Trigger Point Dry Needling - 11/05/17 0845    Consent Given?  Yes    Education Handout Provided  No   verbal information provided   Muscles Treated Lower Body  --   L4, L5, S1 multifidi  Lt/Rt (+) twitch response          PT Education - 11/05/17 0846    Education Details  dry needling info; technique with therex    Person(s) Educated  Patient    Methods  Explanation;Verbal cues;Tactile cues    Comprehension  Verbalized understanding;Returned demonstration       PT Short Term Goals - 10/24/17 0844      PT SHORT TERM GOAL #4   Title  Pt will demo improved understand of proper lifting mechanics, evident by her ability to lift a small box from the floor x5 reps without the need for cuing from PT.     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     Baseline  80% improved neck    Status  Revised    Target Date  11/29/17      PT LONG TERM GOAL #3   Title  The  patient will have deep cervical muscle endurance needed for maintaining better posture while working and driving for at least one hour at a time.    Baseline  Pt has good posture awareness throughout her sessions and has made necessary adjustments to her computer set up.     Time  6    Period  Weeks    Status  Achieved      PT LONG TERM GOAL #4   Title  Pt will demo improved trunk stability and strength, evident by her ability to complete deadbug exercise x5 reps with LE/UE extended without increase in lumbar lordosis or low back pain.     Time  6    Period  Weeks    Status  New    Target Date  11/29/17      PT LONG TERM GOAL #5   Title  Pt will have 5/5 MMT of the LEs to improve her safety and decrease strain on her low back with daily activity.    Time  6    Period  Weeks    Status  New    Target Date  11/29/17            Plan - 11/05/17 0847    Clinical Impression Statement  Pt responds well to manual treatment from last session. She was agreeable to dry needling treatment and there was a noted twitch response in the lumbar multifidi with this. Pt was able to activate her deep abdominals with continued need for heavy cuing and breathing education. She demonstrated understanding of seated breathing/abdominal activation and plans to work on this at home. End of session, pt reported resolved low back discomfort. Will continue with current POC.     Rehab Potential  Excellent    PT Frequency  2x / week    PT Duration  6 weeks    PT Treatment/Interventions  ADLs/Self Care Home Management;Biofeedback;Cryotherapy;Electrical Stimulation;Moist Heat;Ultrasound;Therapeutic activities;Therapeutic exercise;Neuromuscular re-education;Patient/family education;Manual techniques;Passive  range of motion;Taping    PT Next Visit Plan  f/u on d/n lumbar spine; lumbar mobilization/lumbar mobility exercises for pain control; hip rotation mobility; progress supine abdominal strengthening    PT Home  Exercise Plan  Access Code: [email or web address]; [email or web address]    Consulted and Agree with Plan of Care  Patient       Patient will benefit from skilled therapeutic intervention in order to improve the following deficits and impairments:  Pain, Increased fascial restricitons, Increased muscle spasms, Postural dysfunction, Decreased strength, Decreased range of motion  Visit Diagnosis: Chronic low back pain, unspecified back pain laterality, with sciatica presence unspecified  Muscle spasm of back  Muscle weakness (generalized)  Cervicalgia  Other muscle spasm     Problem List Patient Active Problem List   Diagnosis Date Noted  . Medication management 06/10/2017  . Anxiety 01/31/2017  . Bipolar disorder ([place]) 01/31/2017  . Depressive disorder 01/31/2017  . Iron deficiency anemia 05/11/2015  . Allergic sinusitis 06/05/2013  . Muscle spasms of neck trapezious 04/22/2012  . GERD (gastroesophageal reflux disease) 04/22/2012  . Drug interaction 04/22/2012  . BENIGN NEOPLASM OF SKIN SITE UNSPECIFIED 07/25/2009  . DEPRESSION 11/07/2007  . ALLERGIC RHINITIS 11/07/2007  . 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 GERD 11/07/2007   8:56 AM,11/05/17 Sherol Dade PT, DPT [facility] [address] [place], [place], [address] Phone: [phone]   Fax:  [phone]  Name: Lisa Perkins MRN: [id number] Date of Birth: 09/06/1965

## 2017-11-07 ENCOUNTER — Ambulatory Visit: Payer: BLUE CROSS/BLUE SHIELD

## 2017-11-12 ENCOUNTER — Encounter: Payer: Self-pay | Admitting: Physical Therapy

## 2017-11-12 ENCOUNTER — Ambulatory Visit: Payer: BLUE CROSS/BLUE SHIELD | Admitting: Physical Therapy

## 2017-11-12 DIAGNOSIS — G8929 Other chronic pain: Secondary | ICD-10-CM

## 2017-11-12 DIAGNOSIS — M545 Low back pain: Secondary | ICD-10-CM | POA: Diagnosis not present

## 2017-11-12 DIAGNOSIS — M6281 Muscle weakness (generalized): Secondary | ICD-10-CM

## 2017-11-12 DIAGNOSIS — M62830 Muscle spasm of back: Secondary | ICD-10-CM

## 2017-11-12 NOTE — Therapy (Signed)
[facility] [address] [place], [place], [address] Phone: [phone]   Fax:  [phone]  Physical Therapy Treatment  Patient Details  Name: Lisa Perkins MRN: [id number] Date of Birth: [birth year] Referring Provider: Shanon Ace, MD    Encounter Date: 11/12/2017  PT End of Session - 11/12/17 0811    Visit Number  25    Date for PT Re-Evaluation  11/29/17    Authorization Time Period  09/20/17 to 11/29/17    Authorization - Visit Number  26    Authorization - Number of Visits  60    PT Start Time  0800    PT Stop Time  0840    PT Time Calculation (min)  40 min    Activity Tolerance  Patient tolerated treatment well;No increased pain    Behavior During Therapy  WFL for tasks assessed/performed       Past Medical History:  Diagnosis Date  . Abnormal Pap smear of cervix    just a repeat done  . ALLERGIC RHINITIS 11/07/2007  . Asthma as child  . BENIGN NEOPLASM OF SKIN SITE UNSPECIFIED 07/25/2009  . DEPRESSION 11/07/2007  . Eczema   . GERD 11/07/2007   LPR - laryngopharyngeal reflux  . 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 TONSILLECTOMY AND ADENOIDECTOMY    . WISDOM TOOTH EXTRACTION      There were no vitals filed for this visit.  Subjective Assessment - 11/12/17 0809    Subjective  I am feeling well. Patient reports no difference from last visit.     Patient Stated Goals  neck, shoulders, and upper back is the worst    Currently in Pain?  Yes    Pain Score  4     Pain Location  Back    Pain Orientation  Right;Left;Lower    Pain Descriptors / Indicators  Aching    Pain Type  Chronic pain    Pain Radiating Towards  none    Pain Onset  More than a month ago    Pain Frequency  Intermittent    Aggravating Factors   alot activity    Pain Relieving Factors  heat, laying on side    Multiple Pain Sites  No         [facility] PT Assessment - 11/12/17 0001      AROM   Lumbar Flexion  decreased lumbar curvature, pain coming back up    Lumbar Extension  50% limited with pain                   [facility] Adult PT Treatment/Exercise - 11/12/17 0001      Lumbar Exercises: Seated   Other Seated Lumbar Exercises  abdominal brace with BUE press in foam roll and breathing coordination x15 reps       Lumbar Exercises: Supine   Ab Set  5 reps;Other (comment)   x10 sec hold ; on foam roll   Bridge  10 reps    Straight Leg Raise  5 reps   each side, tactile cues for lower abdominals   Straight Leg Raises Limitations  abdominal brace and BUE pressdown    Other Supine Lumbar Exercises  hooklying BUE pressdown to 90 deg with LE march x20 reps each,     Other Supine Lumbar Exercises  hooklying bilateral UE flexion with yellow TB x20 reps (abdominal brace)      Lumbar Exercises: Sidelying  Clam  Right;10 reps   both sides   Clam Limitations  with UE press into table       Manual Therapy   Manual Therapy  Joint mobilization    Joint Mobilization  P-A and rotational mobilization to T8-L5 in prone grae III               PT Short Term Goals - 10/24/17 0844      PT SHORT TERM GOAL #4   Title  Pt will demo improved understand of proper lifting mechanics, evident by her ability to lift a small box from the floor x5 reps without the need for cuing from PT.     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     Baseline  80% improved neck    Status  Revised    Target Date  11/29/17      PT LONG TERM GOAL #3   Title  The patient will have deep cervical muscle  endurance needed for maintaining better posture while working and driving for at least one hour at a time.    Baseline  Pt has good posture awareness throughout her sessions and has made necessary adjustments to her computer set up.     Time  6    Period  Weeks    Status  Achieved      PT LONG TERM GOAL #4   Title  Pt will demo improved trunk stability and strength, evident by her ability to complete deadbug exercise x5 reps with LE/UE extended without increase in lumbar lordosis or low back pain.     Time  6    Period  Weeks    Status  New    Target Date  11/29/17      PT LONG TERM GOAL #5   Title  Pt will have 5/5 MMT of the LEs to improve her safety and decrease strain on her low back with daily activity.    Time  6    Period  Weeks    Status  New    Target Date  11/29/17            Plan - 11/12/17 0840    Clinical Impression Statement  Patient had increased lumbar extension by 25%. Patient was able to perform her exercise program with increased core and hip control with increased reptitions.  Patient had improved mobility of lumbar and lower thoracic after mobilization.  Patient did not feel a change after last session of dry needling.  Patient will benefit from skilled therapy to improve lumbar thoracic mobility with core strength with tactile and verbal cues.     Rehab Potential  Excellent    PT Frequency  2x / week    PT Duration  6 weeks    PT Treatment/Interventions  ADLs/Self Care Home Management;Biofeedback;Cryotherapy;Electrical Stimulation;Moist Heat;Ultrasound;Therapeutic activities;Therapeutic exercise;Neuromuscular re-education;Patient/family education;Manual techniques;Passive range of  motion;Taping    PT Next Visit Plan  lumbar mobilization/lumbar mobility exercises for pain control especially at T10-L1 area; hip rotation mobility; progress supine abdominal strengthening    PT Home Exercise Plan  Access Code: [email or web address]; [email or web address]    Recommended Other Services   initial and 2 renewals were signed by MD    Consulted and Agree with Plan of Care  Patient       Patient will benefit from skilled therapeutic intervention in order to improve the following deficits and impairments:  Pain, Increased fascial restricitons, Increased muscle spasms, Postural dysfunction, Decreased strength, Decreased range of motion  Visit Diagnosis: Chronic low back pain, unspecified back pain laterality, with sciatica presence unspecified  Muscle spasm of back  Muscle weakness (generalized)     Problem List Patient Active Problem List   Diagnosis Date Noted  . Medication management 06/10/2017  . Anxiety 01/31/2017  . Bipolar disorder ([place]) 01/31/2017  . Depressive disorder 01/31/2017  . Iron deficiency anemia 05/11/2015  . Allergic sinusitis 06/05/2013  . Muscle spasms of neck trapezious 04/22/2012  . GERD (gastroesophageal reflux disease) 04/22/2012  . Drug interaction 04/22/2012  . BENIGN NEOPLASM OF SKIN SITE UNSPECIFIED 07/25/2009  . DEPRESSION 11/07/2007  . ALLERGIC RHINITIS 11/07/2007  . 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 GERD 11/07/2007    Earlie Counts, PT 11/12/17 8:44 AM   [facility] [address], [place], [place], [address] Phone: [phone]   Fax:  [phone]  Name: NIRALI MAGOUIRK MRN: [id number] Date of Birth: 28-Mar-1965

## 2017-11-14 ENCOUNTER — Ambulatory Visit: Payer: BLUE CROSS/BLUE SHIELD | Admitting: Physical Therapy

## 2017-11-14 ENCOUNTER — Encounter: Payer: Self-pay | Admitting: Physical Therapy

## 2017-11-14 DIAGNOSIS — M542 Cervicalgia: Secondary | ICD-10-CM

## 2017-11-14 DIAGNOSIS — M545 Low back pain: Principal | ICD-10-CM

## 2017-11-14 DIAGNOSIS — M62830 Muscle spasm of back: Secondary | ICD-10-CM

## 2017-11-14 DIAGNOSIS — M62838 Other muscle spasm: Secondary | ICD-10-CM

## 2017-11-14 DIAGNOSIS — G8929 Other chronic pain: Secondary | ICD-10-CM

## 2017-11-14 DIAGNOSIS — M6281 Muscle weakness (generalized): Secondary | ICD-10-CM

## 2017-11-14 NOTE — Therapy (Signed)
[facility] [address], [place], [place], [address] Phone: [phone]   Fax:  [phone]  Physical Therapy Treatment  Patient Details  Name: Lisa Perkins MRN: [id number] Date of Birth: [birth year] Referring Provider: Shanon Ace, MD    Encounter Date: 11/14/2017  PT End of Session - 11/14/17 0917    Visit Number  26    Date for PT Re-Evaluation  11/29/17    Authorization Time Period  09/20/17 to 11/29/17    Authorization - Visit Number  26    Authorization - Number of Visits  60    PT Start Time  0800    PT Stop Time  0845    PT Time Calculation (min)  45 min    Activity Tolerance  Patient tolerated treatment well;No increased pain    Behavior During Therapy  WFL for tasks assessed/performed       Past Medical History:  Diagnosis Date  . Abnormal Pap smear of cervix    just a repeat done  . ALLERGIC RHINITIS 11/07/2007  . Asthma as child  . BENIGN NEOPLASM OF SKIN SITE UNSPECIFIED 07/25/2009  . DEPRESSION 11/07/2007  . Eczema   . GERD 11/07/2007   LPR - laryngopharyngeal reflux  . 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 TONSILLECTOMY AND ADENOIDECTOMY    . WISDOM TOOTH EXTRACTION      There were no vitals filed for this visit.  Subjective Assessment - 11/14/17 0802    Subjective  Pt reports that things are going well. She has just mild ache in her low back this morning.     Patient Stated Goals  neck, shoulders, and upper back is the worst    Currently in Pain?  Yes    Pain Score  4     Pain Location  Back    Pain Orientation  Right;Left;Lower    Pain Descriptors / Indicators  Aching    Pain Type  Chronic pain    Pain Radiating Towards  none     Pain Onset  More than a month ago    Pain Frequency  Intermittent    Aggravating Factors   alot of activity and prolonged sitting     Pain Relieving Factors  heat, laying on side                        [facility] Adult PT Treatment/Exercise - 11/14/17 0001      Exercises   Exercises  Lumbar      Neck Exercises: Machines for Strengthening   UBE (Upper Arm Bike)  L2 x2 min forward/backward sitting on green physioball for trunk stabilization      Lumbar Exercises: Seated   Other Seated Lumbar Exercises  hip hinge with BUE support on green physioball x15 reps (heavy cuing for proper technique)       Lumbar Exercises: Supine   Dead Bug  10 reps    Dead Bug Limitations  bent knee     Other Supine Lumbar Exercises  low trunk rotation x10 reps each       Manual Therapy   Joint Mobilization  Grade III-IV Lt rotational mobilization x3 bouts, CPAs T6 to S1 grade III-IV x1 bout        manual treatment:  runner stretch with Lt and Rt hip IR, contract relax stretch x5 reps each side     PT Education - 11/14/17  0916    Education Details  addition of low trunk rotation to HEP    Person(s) Educated  Patient    Methods  Explanation;Demonstration;Verbal cues;Handout    Comprehension  Verbalized understanding;Returned demonstration       PT Short Term Goals - 10/24/17 0844      PT SHORT TERM GOAL #4   Title  Pt will demo improved understand of proper lifting mechanics, evident by her ability to lift a small box from the floor x5 reps without the need for cuing from PT.     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     Baseline  80% improved neck    Status  Revised    Target Date  11/29/17      PT LONG TERM GOAL #3   Title  The patient will have deep cervical muscle  endurance needed for maintaining better posture while working and driving for at least one hour at a time.    Baseline  Pt has good posture awareness throughout her sessions and has made necessary adjustments to her computer set up.     Time  6    Period  Weeks    Status  Achieved      PT LONG TERM GOAL #4   Title  Pt will demo improved trunk stability and strength, evident by her ability to complete deadbug exercise x5 reps with LE/UE extended without increase in lumbar lordosis or low back pain.     Time  6    Period  Weeks    Status  New    Target Date  11/29/17      PT LONG TERM GOAL #5   Title  Pt will have 5/5 MMT of the LEs to improve her safety and decrease strain on her low back with daily activity.    Time  6    Period  Weeks    Status  New    Target Date  11/29/17            Plan - 11/14/17 0917    Clinical Impression Statement  Pt feels improved trunk strength and awareness throughout the day. She does continue to have hypomobility in the thoracic and lumbar spine which was addressed with manual treatment. Pt reports resolved low back pain following joint mobilization and therapist added rotation stretch for home to further maintain this progress. Pt was able to demonstrate proper glute activation during hip hinge activity and reported minimal low back fatigue during this exercise. Will continue with current POC.     Rehab Potential  Excellent    PT Frequency  2x / week    PT Duration  6 weeks    PT Treatment/Interventions  ADLs/Self Care Home Management;Biofeedback;Cryotherapy;Electrical Stimulation;Moist Heat;Ultrasound;Therapeutic activities;Therapeutic exercise;Neuromuscular re-education;Patient/family education;Manual techniques;Passive range of motion;Taping    PT Next Visit Plan  foam roll mobility; lumbar mobilization/lumbar mobility  exercises for pain control especially at T10-L1 area; hip rotation mobility; progress supine abdominal strengthening    PT Home  Exercise Plan  Access Code: [email or web address]; [email or web address]    Consulted and Agree with Plan of Care  Patient       Patient will benefit from skilled therapeutic intervention in order to improve the following deficits and impairments:  Pain, Increased fascial restricitons, Increased muscle spasms, Postural dysfunction, Decreased strength, Decreased range of motion  Visit Diagnosis: Chronic low back pain, unspecified back pain laterality, with sciatica presence unspecified  Muscle spasm of back  Muscle weakness (generalized)  Cervicalgia  Other muscle spasm     Problem List Patient Active Problem List   Diagnosis Date Noted  . Medication management 06/10/2017  . Anxiety 01/31/2017  . Bipolar disorder ([place]) 01/31/2017  . Depressive disorder 01/31/2017  . Iron deficiency anemia 05/11/2015  . Allergic sinusitis 06/05/2013  . Muscle spasms of neck trapezious 04/22/2012  . GERD (gastroesophageal reflux disease) 04/22/2012  . Drug interaction 04/22/2012  . BENIGN NEOPLASM OF SKIN SITE UNSPECIFIED 07/25/2009  . DEPRESSION 11/07/2007  . ALLERGIC RHINITIS 11/07/2007  . 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 GERD 11/07/2007     9:31 AM,11/14/17 Sherol Dade PT, DPT [place] [facility] [address], [place], [place], [address] Phone: [phone]   Fax:  [phone]  Name: Lisa Perkins MRN: [id number] Date of Birth: 02/12/1966

## 2017-11-19 ENCOUNTER — Ambulatory Visit: Payer: BLUE CROSS/BLUE SHIELD | Attending: Internal Medicine | Admitting: Physical Therapy

## 2017-11-19 ENCOUNTER — Encounter: Payer: Self-pay | Admitting: Physical Therapy

## 2017-11-19 DIAGNOSIS — M6281 Muscle weakness (generalized): Secondary | ICD-10-CM | POA: Diagnosis present

## 2017-11-19 DIAGNOSIS — M542 Cervicalgia: Secondary | ICD-10-CM

## 2017-11-19 DIAGNOSIS — G8929 Other chronic pain: Secondary | ICD-10-CM | POA: Insufficient documentation

## 2017-11-19 DIAGNOSIS — M62838 Other muscle spasm: Secondary | ICD-10-CM | POA: Insufficient documentation

## 2017-11-19 DIAGNOSIS — M62830 Muscle spasm of back: Secondary | ICD-10-CM

## 2017-11-19 DIAGNOSIS — M545 Low back pain: Secondary | ICD-10-CM | POA: Insufficient documentation

## 2017-11-19 NOTE — Therapy (Signed)
[facility] [address], [place], [place], [address] Phone: [phone]   Fax:  [phone]  Physical Therapy Treatment  Patient Details  Name: Lisa Perkins MRN: [id number] Date of Birth: 02-17-1966 Referring Provider (PT): Shanon Ace, MD    Encounter Date: 11/19/2017  PT End of Session - 11/19/17 0835    Visit Number  27    Date for PT Re-Evaluation  11/29/17    Authorization Time Period  09/20/17 to 11/29/17    Authorization - Visit Number  27    Authorization - Number of Visits  60    PT Start Time  0800    PT Stop Time  0840    PT Time Calculation (min)  40 min    Activity Tolerance  Patient tolerated treatment well;No increased pain    Behavior During Therapy  WFL for tasks assessed/performed       Past Medical History:  Diagnosis Date  . Abnormal Pap smear of cervix    just a repeat done  . ALLERGIC RHINITIS 11/07/2007  . Asthma as child  . BENIGN NEOPLASM OF SKIN SITE UNSPECIFIED 07/25/2009  . DEPRESSION 11/07/2007  . Eczema   . GERD 11/07/2007   LPR - laryngopharyngeal reflux  . 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 TONSILLECTOMY AND ADENOIDECTOMY    . WISDOM TOOTH EXTRACTION      There were no vitals filed for this visit.  Subjective Assessment - 11/19/17 0812    Subjective  Pt states that she was able to drive in the car for up to 4 hours following her last session. Things continue to go well.     Patient Stated Goals  neck, shoulders, and upper back is the worst    Currently in Pain?  Yes    Pain Score  4     Pain Location  Back    Pain Orientation  Right;Left;Lower    Pain Descriptors / Indicators  Aching    Pain Type  Chronic pain    Pain Radiating Towards  none     Pain Onset  More than a month ago    Pain Frequency  Intermittent    Aggravating Factors   prolonged sitting    Pain Relieving Factors  heat, laying on side    Multiple Pain  Sites  No                       [facility] Adult PT Treatment/Exercise - 11/19/17 0001      Lumbar Exercises: Standing   Other Standing Lumbar Exercises  BLE hip hinge with glute press into wall holding 3# dumbbells x10 reps; freestanding hip hinge with SCP for posture cues x10 reps; hip hinge with green TB resistance x10 reps; BLE squat and UE tap on 8" box x10 reps       Lumbar Exercises: Supine   Bridge  10 reps;Limitations    Bridge Limitations  x2 sets, LE propped on 8" box     Other Supine Lumbar Exercises  foam rolling extension over soft foam roll              PT Education - 11/19/17 0832    Education Details  technique with lifting    Person(s) Educated  Patient    Methods  Explanation;Tactile cues;Verbal cues    Comprehension  Verbalized understanding;Returned demonstration       PT Short Term  Goals - 10/24/17 0844      PT SHORT TERM GOAL #4   Title  Pt will demo improved understand of proper lifting mechanics, evident by her ability to lift a small box from the floor x5 reps without the need for cuing from PT.     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     Baseline  80% improved neck    Status  Revised    Target Date  11/29/17      PT LONG TERM GOAL #3   Title  The patient will have deep cervical muscle endurance needed for maintaining better posture while working and driving for at least one hour at a time.    Baseline  Pt has good posture awareness throughout her sessions and has made necessary adjustments to her computer set up.     Time  6    Period  Weeks     Status  Achieved      PT LONG TERM GOAL #4   Title  Pt will demo improved trunk stability and strength, evident by her ability to complete deadbug exercise x5 reps with LE/UE extended without increase in lumbar lordosis or low back pain.     Time  6    Period  Weeks    Status  New    Target Date  11/29/17      PT LONG TERM GOAL #5   Title  Pt will have 5/5 MMT of the LEs to improve her safety and decrease strain on her low back with daily activity.    Time  6    Period  Weeks    Status  New    Target Date  11/29/17            Plan - 11/19/17 0843    Clinical Impression Statement  Pt is doing well, reporting atleast 4 hours of pain free driving following her last session. Focused today on therex to improve glute strength and lumbar/thoracic mobility. Therapist also instructed pt on proper lifting mechanics and pt was able to do this with heavy verbal and tactile cuing initially. Pt was able to properly activate her glutes during this and reported minimal low back fatigue following this. Will continue with current POC moving forward.    Rehab Potential  Excellent    PT Frequency  2x / week    PT Duration  6 weeks    PT Treatment/Interventions  ADLs/Self Care Home Management;Biofeedback;Cryotherapy;Electrical Stimulation;Moist Heat;Ultrasound;Therapeutic activities;Therapeutic exercise;Neuromuscular re-education;Patient/family education;Manual techniques;Passive range of motion;Taping    PT Next Visit Plan  f/u on foam roll and lifting mechanics; glute strength; possible progression of trunk strength to sitting; lumbar mobilization/lumbar mobility exercises for pain control especially at T10-L1 area; hip rotation mobility; progress supine abdominal strengthening    PT Home Exercise Plan  Access Code: [email or web address]; [email or web address]    Consulted and Agree  with Plan of Care  Patient       Patient will benefit from skilled therapeutic intervention in order to improve the following deficits  and impairments:  Pain, Increased fascial restricitons, Increased muscle spasms, Postural dysfunction, Decreased strength, Decreased range of motion  Visit Diagnosis: Muscle spasm of back  Muscle weakness (generalized)  Cervicalgia  Other muscle spasm     Problem List Patient Active Problem List   Diagnosis Date Noted  . Medication management 06/10/2017  . Anxiety 01/31/2017  . Bipolar disorder ([place]) 01/31/2017  . Depressive disorder 01/31/2017  . Iron deficiency anemia 05/11/2015  . Allergic sinusitis 06/05/2013  . Muscle spasms of neck trapezious 04/22/2012  . GERD (gastroesophageal reflux disease) 04/22/2012  . Drug interaction 04/22/2012  . BENIGN NEOPLASM OF SKIN SITE UNSPECIFIED 07/25/2009  . DEPRESSION 11/07/2007  . ALLERGIC RHINITIS 11/07/2007  . 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 GERD 11/07/2007     8:49 AM,11/19/17 Sherol Dade PT, DPT [facility] [address], [place], [place], [address] Phone: [phone]   Fax:  [phone]  Name: BRITINEY BLAHNIK MRN: [id number] Date of Birth: May 14, 1965

## 2017-11-19 NOTE — Patient Instructions (Signed)
Access Code: [email or web address]  URL: [email or web address]  Date: 11/19/2017  Prepared by: Sherol Dade   Exercises  Single Arm Doorway Pec Stretch at 90 Degrees Abduction - 3 reps - 1 sets - 30 sec hold - 1x daily - 7x weekly  Standing Bent Over Single Arm Scapular Row with Table Support - 10 reps - 3 sets - 1x daily - 7x weekly  Sidelying Shoulder External Rotation - 10 reps - 2 sets - 1x daily - 7x weekly  Seated Serratus Press with Anchored Resistance - 15 reps - 3 sets - 1x daily - 7x weekly  Sidelying Shoulder Flexion 15 Degrees - 10 reps - 2 sets - 1x daily - 7x weekly  Supine Shoulder Flexion with Resistance - 15-20 reps - 2 sets - 1x daily - 7x weekly  Seated Piriformis Stretch - 3 sets - 30 hold - 1x daily - 7x weekly  Hooklying Isometric Hip Flexion - 10 reps - 2 sets - 1x daily - 7x weekly  Hooklying Sequential Leg March and Lower - 10 reps - 2 sets - 1x daily - 7x weekly  Bent Knee Fallouts - 10 reps - 2 sets - 1x daily - 7x weekly  Supine Lower Trunk Rotation - 10 reps - 1x daily - 7x weekly  Supine Bridge on Wall - 10 reps - 2 sets - 1x daily - 7x weekly    [facility] [address] [place], [place] [address] Phone # [phone] Fax [phone]

## 2017-11-21 ENCOUNTER — Encounter: Payer: Self-pay | Admitting: Physical Therapy

## 2017-11-21 ENCOUNTER — Ambulatory Visit: Payer: BLUE CROSS/BLUE SHIELD | Admitting: Physical Therapy

## 2017-11-21 DIAGNOSIS — M542 Cervicalgia: Secondary | ICD-10-CM

## 2017-11-21 DIAGNOSIS — M62838 Other muscle spasm: Secondary | ICD-10-CM

## 2017-11-21 DIAGNOSIS — M6281 Muscle weakness (generalized): Secondary | ICD-10-CM

## 2017-11-21 DIAGNOSIS — M62830 Muscle spasm of back: Secondary | ICD-10-CM | POA: Diagnosis not present

## 2017-11-21 NOTE — Therapy (Signed)
[facility] [address] [place], [place], [address] Phone: [phone]   Fax:  [phone]  Physical Therapy Treatment  Patient Details  Name: Lisa Perkins MRN: [id number] Date of Birth: August 22, 1965 Referring Provider (PT): Shanon Ace, MD    Encounter Date: 11/21/2017  PT End of Session - 11/21/17 0840    Visit Number  28    Date for PT Re-Evaluation  11/29/17    Authorization Time Period  09/20/17 to 11/29/17    Authorization - Visit Number  28    Authorization - Number of Visits  60    PT Start Time  0802    PT Stop Time  0845    PT Time Calculation (min)  43 min    Activity Tolerance  Patient tolerated treatment well;No increased pain    Behavior During Therapy  WFL for tasks assessed/performed       Past Medical History:  Diagnosis Date  . Abnormal Pap smear of cervix    just a repeat done  . ALLERGIC RHINITIS 11/07/2007  . Asthma as child  . BENIGN NEOPLASM OF SKIN SITE UNSPECIFIED 07/25/2009  . DEPRESSION 11/07/2007  . Eczema   . GERD 11/07/2007   LPR - laryngopharyngeal reflux  . 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 TONSILLECTOMY AND ADENOIDECTOMY    . WISDOM TOOTH EXTRACTION      There were no vitals filed for this visit.  Subjective Assessment - 11/21/17 0814    Subjective  Pt reports that her back felt better than it has in years following her last session. She still has some discomfort from new movements and has some questions regarding her HEP.     Patient Stated Goals  neck, shoulders, and upper back is the worst    Currently in Pain?  Yes    Pain Score  4     Pain Location  --   low back    Pain Onset  More than a month ago                       [facility] Adult PT Treatment/Exercise - 11/21/17 0001      Exercises   Exercises  Lumbar      Lumbar Exercises: Standing   Shoulder Extension  Strengthening;Both;10 reps    Shoulder Extension Limitations  x2 sets, 1st with green TB 2nd with blue TB    Other Standing Lumbar Exercises  Standing hip IE/ER with green TB x10 reps each     Other Standing Lumbar Exercises  Standing RNT closed chain DF with red TB 5x10 sec each      Lumbar Exercises: Supine   Straight Leg Raise  10 reps    Straight Leg Raises Limitations  BUE press and abdominal activation    Other Supine Lumbar Exercises  attempted 90/90 alternating LE lower with towe in various positions without decrease in discomfort; bent knee 90/90 hold x15 sec for 4 reps              PT Education - 11/21/17 0838    Education Details  technique with therex; adjustments to HEP technique for decreased pain    Person(s) Educated  Patient    Methods  Explanation    Comprehension  Verbalized understanding       PT Short Term Goals - 10/24/17 0844      PT SHORT TERM GOAL #4  Title  Pt will demo improved understand of proper lifting mechanics, evident by her ability to lift a small box from the floor x5 reps without the need for cuing from PT.     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     Baseline  80% improved neck    Status  Revised    Target Date  11/29/17      PT LONG TERM GOAL #3   Title  The patient will have deep cervical muscle endurance needed for maintaining better posture while working and driving for at least one hour at a time.    Baseline  Pt has good posture awareness throughout her sessions and has made necessary adjustments to her computer set up.     Time  6    Period  Weeks    Status  Achieved       PT LONG TERM GOAL #4   Title  Pt will demo improved trunk stability and strength, evident by her ability to complete deadbug exercise x5 reps with LE/UE extended without increase in lumbar lordosis or low back pain.     Time  6    Period  Weeks    Status  New    Target Date  11/29/17      PT LONG TERM GOAL #5   Title  Pt will have 5/5 MMT of the LEs to improve her safety and decrease strain on her low back with daily activity.    Time  6    Period  Weeks    Status  New    Target Date  11/29/17            Plan - 11/21/17 0840    Clinical Impression Statement  Pt has noted good improvements in back discomfort and mobility with new addition of foam roll exercise at home. Therapist was able to correct technique with supine abdominal bracing to ensure proper form and decrease in back pain was achieved. Will follow up on lifting technique next session to ensure that there was carry over with this. Ended session without reports of increase in pain.     Rehab Potential  Excellent    PT Frequency  2x / week    PT Duration  6 weeks    PT Treatment/Interventions  ADLs/Self Care Home Management;Biofeedback;Cryotherapy;Electrical Stimulation;Moist Heat;Ultrasound;Therapeutic activities;Therapeutic exercise;Neuromuscular re-education;Patient/family education;Manual techniques;Passive range of motion;Taping    PT Next Visit Plan  f/u on foam roll and lifting mechanics; separate HEP into flexibility/strength; glute strength; possible progression of trunk strength to sitting; lumbar mobilization/lumbar mobility exercises for pain control especially at T10-L1 area; hip rotation mobility; progress supine abdominal strengthening    PT Home Exercise Plan  Access Code: [email or web address]; [email or web address]    Consulted and Agree with Plan of Care  Patient       Patient will benefit from skilled therapeutic intervention in  order to improve the following deficits and impairments:  Pain, Increased fascial  restricitons, Increased muscle spasms, Postural dysfunction, Decreased strength, Decreased range of motion  Visit Diagnosis: Muscle spasm of back  Muscle weakness (generalized)  Cervicalgia  Other muscle spasm     Problem List Patient Active Problem List   Diagnosis Date Noted  . Medication management 06/10/2017  . Anxiety 01/31/2017  . Bipolar disorder ([place]) 01/31/2017  . Depressive disorder 01/31/2017  . Iron deficiency anemia 05/11/2015  . Allergic sinusitis 06/05/2013  . Muscle spasms of neck trapezious 04/22/2012  . GERD (gastroesophageal reflux disease) 04/22/2012  . Drug interaction 04/22/2012  . BENIGN NEOPLASM OF SKIN SITE UNSPECIFIED 07/25/2009  . DEPRESSION 11/07/2007  . ALLERGIC RHINITIS 11/07/2007  . 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 GERD 11/07/2007     8:51 AM,11/21/17 Sherol Dade PT, DPT [place] [facility] [address], [place], [place], [address] Phone: [phone]   Fax:  [phone]  Name: LORRINDA RAMSTAD MRN: [id number] Date of Birth: 07-07-1965

## 2017-11-26 ENCOUNTER — Encounter: Payer: Self-pay | Admitting: Physical Therapy

## 2017-11-26 ENCOUNTER — Ambulatory Visit: Payer: BLUE CROSS/BLUE SHIELD | Admitting: Physical Therapy

## 2017-11-26 DIAGNOSIS — M6281 Muscle weakness (generalized): Secondary | ICD-10-CM

## 2017-11-26 DIAGNOSIS — M545 Low back pain, unspecified: Secondary | ICD-10-CM

## 2017-11-26 DIAGNOSIS — G8929 Other chronic pain: Secondary | ICD-10-CM

## 2017-11-26 DIAGNOSIS — M62830 Muscle spasm of back: Secondary | ICD-10-CM | POA: Diagnosis not present

## 2017-11-26 DIAGNOSIS — M62838 Other muscle spasm: Secondary | ICD-10-CM

## 2017-11-26 DIAGNOSIS — M542 Cervicalgia: Secondary | ICD-10-CM

## 2017-11-26 NOTE — Patient Instructions (Addendum)
Access Code: [email or web address]  URL: [email or web address]  Date: 11/26/2017  Prepared by: Sherol Dade   Exercises  Single Arm Doorway Pec Stretch at 90 Degrees Abduction - 3 reps - 1 sets - 30 sec hold - 1x daily - 7x weekly  Standing Bent Over Single Arm Scapular Row with Table Support - 10 reps - 3 sets - 1x daily - 7x weekly  Sidelying Shoulder External Rotation - 10 reps - 2 sets - 1x daily - 7x weekly  Seated Serratus Press with Anchored Resistance - 15 reps - 3 sets - 1x daily - 7x weekly  Sidelying Shoulder Flexion 15 Degrees - 10 reps - 2 sets - 1x daily - 7x weekly  Supine Shoulder Flexion with Resistance - 15-20 reps - 2 sets - 1x daily - 7x weekly   Access Code: [email or web address]  URL: [email or web address]  Date: 11/26/2017  Prepared by: Sherol Dade   Exercises  Clamshell with Resistance - 10 reps - 2 sets - 1x daily - 7x weekly  Supine Bridge on Wall - 10-15 reps - 2 sets - 1x daily - 7x weekly  Standing Diagonal Lift with Anchored Resistance - 10 reps - 1-2 sets - 1x daily - 7x weekly  Supine March with Alternating Leg Lifts - 10 reps - 1-2 sets - 1x daily - 7x weekly  Supine Bent Leg Lift with Knee Extension - 10 reps - 1-2 sets - 1x daily - 7x weekly

## 2017-11-26 NOTE — Therapy (Signed)
[facility] [address] [place], [place], [address] Phone: [phone]   Fax:  [phone]  Physical Therapy Treatment/re-evaluation  Patient Details  Name: Lisa Perkins MRN: [id number] Date of Birth: Mar 22, 1965 Referring Provider (PT): Shanon Ace, MD    Encounter Date: 11/26/2017  PT End of Session - 11/26/17 0848    Visit Number  29    Date for PT Re-Evaluation  04/02/18    Authorization Time Period  11/30/17 to 04/02/18    Authorization - Visit Number  29    Authorization - Number of Visits  60    PT Start Time  0801    PT Stop Time  0845    PT Time Calculation (min)  44 min    Activity Tolerance  Patient tolerated treatment well;No increased pain    Behavior During Therapy  WFL for tasks assessed/performed       Past Medical History:  Diagnosis Date  . Abnormal Pap smear of cervix    just a repeat done  . ALLERGIC RHINITIS 11/07/2007  . Asthma as child  . BENIGN NEOPLASM OF SKIN SITE UNSPECIFIED 07/25/2009  . DEPRESSION 11/07/2007  . Eczema   . GERD 11/07/2007   LPR - laryngopharyngeal reflux  . 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 TONSILLECTOMY AND ADENOIDECTOMY    . WISDOM TOOTH EXTRACTION      There were no vitals filed for this visit.  Subjective Assessment - 11/26/17 0808    Subjective  Pt reports that her pain is improving. She has been trying to do her foam roll but needs to purchase a softer one from the store. No other complaints at this time.    Patient Stated Goals  neck, shoulders, and upper back is the worst    Currently in Pain?  Yes    Pain Score  3     Pain Location  --   across low back    Pain Orientation  Right;Left;Lower    Pain Descriptors / Indicators  Aching    Pain Type  Chronic pain    Pain Radiating Towards  none     Pain Onset  More than a month ago    Pain Frequency  Intermittent    Aggravating Factors   prolonged  sitting     Pain Relieving Factors  heat, laying on side         [facility] PT Assessment - 11/26/17 0001      Assessment   Medical Diagnosis  M62.838 (ICD-10-CM) - Muscle spasms of neck    Referring Provider (PT)  Shanon Ace, MD     Onset Date/Surgical Date  --   20 years of low back pain    Hand Dominance  Right    Prior Therapy  Yes      Precautions   Precautions  None      Restrictions   Weight Bearing Restrictions  No      Home Environment   Living Environment  Private residence    Living Arrangements  Spouse/significant other      Prior Function   Level of Independence  Independent    Vocation  Full time employment    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      Cognition   Overall Cognitive Status  Within Functional Limits for tasks assessed      Observation/Other Assessments   Focus on Therapeutic Outcomes (FOTO)  25% limited (neck)      AROM   Overall AROM Comments  --    Cervical Flexion  --    Cervical Extension  --    Cervical - Right Side Bend  --    Cervical - Left Side Bend  --    Cervical - Right Rotation  --    Cervical - Left Rotation  --    Lumbar Flexion  decreased lumbar curvature, pain coming back up    Lumbar Extension  75% limited, mild discomfort end range     Lumbar - Right Rotation  limited, pain end range     Lumbar - Left Rotation  limited, pain end range       PROM   Overall PROM Comments   Rt and Lt hip IR 20 deg       Strength   Overall Strength Comments  B hip abduction and extension 4/5 MMT      Palpation   Spinal mobility  hypomobile lumbar spine, tender along L3/L4       Ambulation/Gait   Gait Pattern  Within Functional Limits                   [facility] Adult PT Treatment/Exercise - 11/26/17 0001      Lumbar Exercises: Supine   Other Supine Lumbar Exercises  self foam roll extension mid and low thoracic spine (soft foam roll)     Other Supine Lumbar Exercises bent knee 90/90 with hip abduction 5 deg  x7 reps each with tactile cues;  supine diagonal lifts each direction with yellow TB x10 reps each, x5 reps initially with proper activation and breathing cues     Knee/Hip Exercises: Sidelying   Clams  2x10 reps with green TB each LE              PT Education - 11/26/17 0858    Education Details  progress and POC moving forward; updated and reviewed HEP    Person(s) Educated  Patient    Methods  Explanation;Handout;Verbal cues    Comprehension  Verbalized understanding;Returned demonstration       PT Short Term Goals - 11/26/17 0849      PT SHORT TERM GOAL #1   Title  pt will be ind with initial HEP    Time  3    Period  Weeks    Status  Achieved      PT SHORT TERM GOAL #2   Title  The patient will have improved cervical sidebending to 40 degrees and rotation bilaterally with minimal increase in pain    Baseline  pain free cervical ROM    Time  3    Period  Weeks    Status  Achieved      PT SHORT TERM GOAL #3   Title  The patient will report a 25% improvement in pain with using the computer at work    Baseline  80% improved neck, 25% improved low back    Time  3    Period  Weeks    Status  Achieved      PT SHORT TERM GOAL #4   Title  Pt will demo improved understand of proper lifting mechanics, evident by her ability to lift a small box from the floor x5 reps without the need for cuing from PT.     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     Baseline  80% improved neck, 25% low back    Status  Revised      PT LONG TERM GOAL #3   Title  The patient will have deep cervical muscle endurance needed for maintaining better posture while working and driving for at least one hour at a time.    Baseline  Pt has good posture awareness throughout her sessions and has made necessary adjustments to her computer set up.     Time  6    Period  Weeks    Status  Achieved      PT LONG TERM GOAL #4   Title  Pt will demo improved trunk stability and strength, evident by her ability to complete deadbug exercise x5 reps with LE/UE extended without increase in lumbar lordosis or low back pain.     Time  6    Period  Weeks    Status  Achieved      PT LONG TERM GOAL #5   Title  Pt will have 5/5 MMT of the LEs to improve her safety and decrease strain on her low back with daily activity.    Time  12    Period  Weeks    Status  New    Target Date  02/20/18      Additional Long Term Goals   Additional Long Term Goals  Yes      PT LONG TERM GOAL #6   Title  Pt will demo improved trunk strength and endurance evident by her ability to complete quadruped UE/LE reach x5 reps each side without LOB or excessive trunk rotation.    Time  4    Period  Months    Status  New    Target Date  04/01/17            Plan - 11/26/17 2423    Clinical Impression Statement  Pt has made steady progress towards her goals, with atleast 25% improvement in her low back pain from the start of treatment. She no longer has neck pain/stiffness with her daily activity. She has improved BLE strength to 4/5 MMT and her trunk strength/control has improved as well. This is evident by reports of up to 4 hours of pain  free driving following her sessions and her increasing ability to maintain proper abdominal activation during exercise progressions. She has consistently been completing her HEP and demonstrates improvements  in lifting mechanics, however her limitations in spine mobility, trunk strength and endurance make it difficult to progress strengthening exercises on a weekly basis. Pt would benefit from continued skilled PT on a monthly basis to ensure that she had adequate time to improve her strength and to allow the therapist to progress her HEP in a safe/adequate manner. She would also benefit from skilled PT intervention to improve spine mobility and decrease pain as needed. Pt was agreeable with this and demonstrated understanding of HEP updates made this session.     Rehab Potential  Excellent    PT Frequency  Monthy    PT Duration  Other (comment)   4 months   PT Treatment/Interventions  ADLs/Self Care Home Management;Biofeedback;Cryotherapy;Electrical Stimulation;Moist Heat;Ultrasound;Therapeutic activities;Therapeutic exercise;Neuromuscular re-education;Patient/family education;Manual techniques;Passive range of motion;Taping    PT Next Visit Plan  addition of trunk/LE flexibility program; f/u on foam roll and lifting mechanics; glute strength; possible progression of trunk strength to sitting; lumbar mobilization/lumbar mobility exercises for pain control especially at T10-L1 area; hip rotation mobility; progress supine abdominal strengthening    PT Home Exercise Plan  UE: [id number]; blackburn 6s; trunk/LE strength: [id number]     Consulted and Agree with Plan of Care  Patient       Patient will benefit from skilled therapeutic intervention in order to improve the following deficits and impairments:  Pain, Increased fascial restricitons, Increased muscle spasms, Postural dysfunction, Decreased strength, Decreased range of motion  Visit Diagnosis: Muscle spasm of back  Muscle weakness  (generalized)  Cervicalgia  Other muscle spasm     Problem List Patient Active Problem List   Diagnosis Date Noted  . Medication management 06/10/2017  . Anxiety 01/31/2017  . Bipolar disorder ([place]) 01/31/2017  . Depressive disorder 01/31/2017  . Iron deficiency anemia 05/11/2015  . Allergic sinusitis 06/05/2013  . Muscle spasms of neck trapezious 04/22/2012  . GERD (gastroesophageal reflux disease) 04/22/2012  . Drug interaction 04/22/2012  . BENIGN NEOPLASM OF SKIN SITE UNSPECIFIED 07/25/2009  . DEPRESSION 11/07/2007  . ALLERGIC RHINITIS 11/07/2007  . 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 GERD 11/07/2007    9:08 AM,11/26/17 Sherol Dade PT, DPT [place] [facility] [address] [place], [place], [address] Phone: [phone]   Fax:  [phone]  Name: Lisa Perkins MRN: [id number] Date of Birth: Aug 21, 1965

## 2017-11-28 ENCOUNTER — Encounter: Payer: Self-pay | Admitting: Physical Therapy

## 2017-11-28 ENCOUNTER — Ambulatory Visit: Payer: BLUE CROSS/BLUE SHIELD | Admitting: Physical Therapy

## 2017-11-28 DIAGNOSIS — M545 Low back pain: Secondary | ICD-10-CM

## 2017-11-28 DIAGNOSIS — M62830 Muscle spasm of back: Secondary | ICD-10-CM | POA: Diagnosis not present

## 2017-11-28 DIAGNOSIS — G8929 Other chronic pain: Secondary | ICD-10-CM

## 2017-11-28 DIAGNOSIS — M542 Cervicalgia: Secondary | ICD-10-CM

## 2017-11-28 DIAGNOSIS — M6281 Muscle weakness (generalized): Secondary | ICD-10-CM

## 2017-11-28 NOTE — Therapy (Signed)
[facility] [address] [place], [place], [address] Phone: [phone]   Fax:  [phone]  Physical Therapy Treatment  Patient Details  Name: Lisa Perkins MRN: [id number] Date of Birth: 07/13/1965 Referring Provider (PT): Shanon Ace, MD    Encounter Date: 11/28/2017  PT End of Session - 11/28/17 0842    Visit Number  30    Date for PT Re-Evaluation  04/02/18    Authorization Time Period  11/30/17 to 04/02/18    Authorization - Visit Number  30    Authorization - Number of Visits  60    PT Start Time  0800    PT Stop Time  0840    PT Time Calculation (min)  40 min    Activity Tolerance  Patient tolerated treatment well;No increased pain    Behavior During Therapy  WFL for tasks assessed/performed       Past Medical History:  Diagnosis Date  . Abnormal Pap smear of cervix    just a repeat done  . ALLERGIC RHINITIS 11/07/2007  . Asthma as child  . BENIGN NEOPLASM OF SKIN SITE UNSPECIFIED 07/25/2009  . DEPRESSION 11/07/2007  . Eczema   . GERD 11/07/2007   LPR - laryngopharyngeal reflux  . 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 TONSILLECTOMY AND ADENOIDECTOMY    . WISDOM TOOTH EXTRACTION      There were no vitals filed for this visit.  Subjective Assessment - 11/28/17 0802    Subjective  Pt reports that she had 2 good days following her last session. She has just a dull ache currently.     Patient Stated Goals  neck, shoulders, and upper back is the worst    Currently in Pain?  Yes    Pain Score  3     Pain Location  --   across low back    Pain Orientation  Right;Left    Pain Descriptors / Indicators  Aching    Pain Type  Chronic pain    Pain Radiating Towards  none     Pain Onset  More than a month ago    Pain Frequency  Intermittent    Aggravating Factors   prolonged sitting and lifting     Pain Relieving Factors  heat, laying on side                        [facility] Adult PT Treatment/Exercise - 11/28/17 0001      Exercises   Exercises  Lumbar    Other Exercises   UE partial roll prone to supine with pillow underneath hip x5 reps each; partial roll prone to supine with LE leading x5 reps each (tactile/verbal cues required)       Lumbar Exercises: Stretches   Hip Flexor Stretch  2 reps;Left;Right;30 seconds    Hip Flexor Stretch Limitations  supine       Lumbar Exercises: Seated   Other Seated Lumbar Exercises  anterior/posterior pelvic tilts x10 reps       Lumbar Exercises: Supine   Other Supine Lumbar Exercises  low trunk rotation x7 reps Lt/Rt LE on red physioball       Lumbar Exercises: Quadruped   [place]  10 reps      Manual Therapy   Joint Mobilization  Grade III CPAs T4 to S1, x1 bout  PT Education - 11/28/17 0847    Education Details  updated HEP; technique with therex    Person(s) Educated  Patient    Methods  Explanation    Comprehension  Verbalized understanding       PT Short Term Goals - 11/26/17 0849      PT SHORT TERM GOAL #1   Title  pt will be ind with initial HEP    Time  3    Period  Weeks    Status  Achieved      PT SHORT TERM GOAL #2   Title  The patient will have improved cervical sidebending to 40 degrees and rotation bilaterally with minimal increase in pain    Baseline  pain free cervical ROM    Time  3    Period  Weeks    Status  Achieved      PT SHORT TERM GOAL #3   Title  The patient will report a 25% improvement in pain with using the computer at work    Baseline  80% improved neck, 25% improved low back    Time  3    Period  Weeks    Status  Achieved      PT SHORT TERM GOAL #4   Title  Pt will demo improved understand of proper lifting mechanics, evident by her ability to lift a small box from the floor x5 reps without the need for cuing from PT.     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     Baseline  80% improved neck, 25% low back    Status  Revised      PT LONG TERM GOAL #3   Title  The patient will have deep cervical muscle endurance needed for maintaining better posture while working and driving for at least one hour at a time.    Baseline  Pt has good posture awareness throughout her sessions and has made necessary adjustments to her computer set up.     Time  6  Period  Weeks    Status  Achieved      PT LONG TERM GOAL #4   Title  Pt will demo improved trunk stability and strength, evident by her ability to complete deadbug exercise x5 reps with LE/UE extended without increase in lumbar lordosis or low back pain.     Time  6    Period  Weeks    Status  Achieved      PT LONG TERM GOAL #5   Title  Pt will have 5/5 MMT of the LEs to improve her safety and decrease strain on her low back with daily activity.    Time  12    Period  Weeks    Status  New    Target Date  02/20/18      Additional Long Term Goals   Additional Long Term Goals  Yes      PT  LONG TERM GOAL #6   Title  Pt will demo improved trunk strength and endurance evident by her ability to complete quadruped UE/LE reach x5 reps each side without LOB or excessive trunk rotation.    Time  4    Period  Months    Status  New    Target Date  04/01/17            Plan - 11/28/17 0835    Clinical Impression Statement  Pt reports 2 days nearly pain free following last session. Continued this visit with HEP updates to include hip and lumbar mobility/control. Pt had significant difficulty with prone to supine rolling, requiring heavy cues for proper abdominal activation. She demonstrated good understanding of HEP updates and plans to continue with HEP adherence and strength gains, and she will follow up in approximately 4 weeks to allow for therapist monitoring of progress and updates needed to improve abdominal endurance, spine mobility/control and LE strength.    Rehab Potential  Excellent    PT Frequency  Monthy    PT Duration  Other (comment)   4 months   PT Treatment/Interventions  ADLs/Self Care Home Management;Biofeedback;Cryotherapy;Electrical Stimulation;Moist Heat;Ultrasound;Therapeutic activities;Therapeutic exercise;Neuromuscular re-education;Patient/family education;Manual techniques;Passive range of motion;Taping    PT Next Visit Plan  f/u on foam roll and lifting mechanics; seated trunk strength progression; glute strength    PT Home Exercise Plan  UE: [id number]; blackburn 6s; trunk/LE strength: [id number]; flexibility [id number]    Consulted and Agree with Plan of Care  Patient       Patient will benefit from skilled therapeutic intervention in order to improve the following deficits and impairments:  Pain, Increased fascial restricitons, Increased muscle spasms, Postural dysfunction, Decreased strength, Decreased range of motion  Visit Diagnosis: Muscle spasm of back  Muscle weakness (generalized)  Chronic low back pain, unspecified back pain laterality,  unspecified whether sciatica present  Cervicalgia     Problem List Patient Active Problem List   Diagnosis Date Noted  . Medication management 06/10/2017  . Anxiety 01/31/2017  . Bipolar disorder ([place]) 01/31/2017  . Depressive disorder 01/31/2017  . Iron deficiency anemia 05/11/2015  . Allergic sinusitis 06/05/2013  . Muscle spasms of neck trapezious 04/22/2012  . GERD (gastroesophageal reflux disease) 04/22/2012  . Drug interaction 04/22/2012  . BENIGN NEOPLASM OF SKIN SITE UNSPECIFIED 07/25/2009  . DEPRESSION 11/07/2007  . ALLERGIC RHINITIS 11/07/2007  . 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 GERD 11/07/2007    8:49 AM,11/28/17 Sherol Dade PT, DPT [place] [facility] [address], [place], [place], [address] Phone: [phone]   Fax:  [phone]  Name: Lisa Perkins MRN: [id number] Date of Birth: [birth year]

## 2017-12-11 ENCOUNTER — Telehealth: Payer: Self-pay | Admitting: *Deleted

## 2017-12-11 DIAGNOSIS — R7989 Other specified abnormal findings of blood chemistry: Secondary | ICD-10-CM

## 2017-12-11 DIAGNOSIS — Z79899 Other long term (current) drug therapy: Secondary | ICD-10-CM

## 2017-12-11 NOTE — Telephone Encounter (Signed)
Per last lab results, the patient was advised to return in 1 year (around 05/2018) for repeat labs.   Notes recorded by Burnis Medin, MD on 06/10/2017 at 6:54 PM EDT cholesterol favorable ldl borderline Rest normal but  Kidney function based in creatinine is  Borderline low based on age .  Should check yearly and avoid regular use of nsaids  Pt is wanting to know if she can go ahead and have these done now. Please advise Dr Regis Bill, thanks.

## 2017-12-11 NOTE — Telephone Encounter (Signed)
Copied from [place] [id number]. Topic: Appointment Scheduling - Scheduling Inquiry for Clinic >> Dec 09, 2017 10:22 AM Burchel, Abbi R wrote: Pt requesting to have repeat labs scheduled for Kidney function.  Pt states they were "borderline" at her physical 6 mo ago.  Please advise.    [phone]

## 2017-12-13 NOTE — Telephone Encounter (Signed)
Pt aware that labs have been entered and she can get these when ready. Pt aware that she will need to have a lab appt to get this done. Pt will call back when ready. Nothing further needed.

## 2017-12-13 NOTE — Telephone Encounter (Signed)
Orders place for bmp and  urine protein and ua

## 2017-12-18 ENCOUNTER — Other Ambulatory Visit (INDEPENDENT_AMBULATORY_CARE_PROVIDER_SITE_OTHER): Payer: BLUE CROSS/BLUE SHIELD

## 2017-12-18 DIAGNOSIS — R7989 Other specified abnormal findings of blood chemistry: Secondary | ICD-10-CM

## 2017-12-18 DIAGNOSIS — Z79899 Other long term (current) drug therapy: Secondary | ICD-10-CM | POA: Diagnosis not present

## 2017-12-18 LAB — POC URINALSYSI DIPSTICK (AUTOMATED)
Bilirubin, UA: NEGATIVE
Blood, UA: NEGATIVE
Glucose, UA: NEGATIVE
Ketones, UA: NEGATIVE
Leukocytes, UA: NEGATIVE
Nitrite, UA: NEGATIVE
Protein, UA: NEGATIVE
Spec Grav, UA: 1.01 (ref 1.010–1.025)
Urobilinogen, UA: 0.2 E.U./dL
pH, UA: 6.5 (ref 5.0–8.0)

## 2017-12-18 LAB — BASIC METABOLIC PANEL
BUN: 9 mg/dL (ref 6–23)
CO2: 30 mEq/L (ref 19–32)
Calcium: 9.9 mg/dL (ref 8.4–10.5)
Chloride: 99 mEq/L (ref 96–112)
Creatinine, Ser: 1.09 mg/dL (ref 0.40–1.20)
GFR: 56.41 mL/min — ABNORMAL LOW (ref >60.00)
Glucose, Bld: 88 mg/dL (ref 70–99)
Potassium: 4.6 mEq/L (ref 3.5–5.1)
Sodium: 136 mEq/L (ref 135–145)

## 2017-12-19 ENCOUNTER — Telehealth: Payer: Self-pay | Admitting: Internal Medicine

## 2017-12-19 DIAGNOSIS — R7989 Other specified abnormal findings of blood chemistry: Secondary | ICD-10-CM

## 2017-12-19 LAB — PROTEIN / CREATININE RATIO, URINE
Creatinine, Urine: 30 mg/dL (ref 20–275)
Total Protein, Urine: <4 mg/dL — ABNORMAL LOW (ref 5–24)

## 2017-12-19 NOTE — Telephone Encounter (Signed)
Left message on machine for patient returning her call 

## 2017-12-19 NOTE — Telephone Encounter (Signed)
Copied from [place]. Topic: Quick Communication - See Telephone Encounter >> Dec 19, 2017  9:20 AM Ahmed Prima L wrote: CRM for notification. See Telephone encounter for: 12/19/17.  Patient is calling for her results from yesterday 10/30, patient would like a call back

## 2017-12-19 NOTE — Telephone Encounter (Signed)
Patient is aware of lab results.  Patient would like to know if she should contact a nephrologist concerning her kidney function test?

## 2017-12-20 ENCOUNTER — Telehealth: Payer: Self-pay | Admitting: *Deleted

## 2017-12-20 NOTE — Telephone Encounter (Signed)
Left message on machine for patient to return our call CRM 

## 2017-12-20 NOTE — Telephone Encounter (Signed)
I would do a 24 hour urine for creatinine clearance  First and refer only if abnormal  The creatinine    Is a surrogate marker    Please order if she agrees

## 2017-12-20 NOTE — Telephone Encounter (Signed)
Please advise Dr Regis Bill, thanks.   Notes recorded by Burnis Medin, MD on 06/10/2017 at 6:54 PM EDT cholesterol favorable ldl borderline Rest normal but  Kidney function based in creatinine is  Borderline low based on age . Should check yearly and avoid regular use of nsaids  Due for Annual 05/2018

## 2017-12-20 NOTE — Telephone Encounter (Signed)
See TE 10/31 Nothing further needed.

## 2017-12-20 NOTE — Telephone Encounter (Signed)
I though I already answered this     Order  a 24 hour creatinine clearance and then go from there .  We can do ov after testing is done and decide if further evalluation is needed  If abnormal  24 hours urine we can do a renal ultrasound .

## 2017-12-20 NOTE — Telephone Encounter (Signed)
Pt called in returning a call from the [place] office, Dr. Velora Mediate message.    I read her the message from Dr. Regis Bill dated Nov. 1, 2019 at 8:02 AM:  I would do a wr hour urine for creatinine clearance.   First and refer only if abnormal.  The creatinine is a surrogate marker.   Please order if she agrees.  She agrees to the 24 hour urine collection.  She is requesting to do it over the weekend so she will not have to miss work.   She can bring the urine into the office Monday morning.  You can leave a detailed, confidential voicemail per pt.   That way you will not be playing phone tag.     I routed this note to Dr. Velora Mediate nurse pool.

## 2017-12-20 NOTE — Telephone Encounter (Signed)
Patient is aware and lab ordered.

## 2017-12-24 ENCOUNTER — Encounter: Payer: Self-pay | Admitting: Physical Therapy

## 2017-12-24 ENCOUNTER — Ambulatory Visit: Payer: BLUE CROSS/BLUE SHIELD | Attending: Internal Medicine | Admitting: Physical Therapy

## 2017-12-24 DIAGNOSIS — M6281 Muscle weakness (generalized): Secondary | ICD-10-CM | POA: Insufficient documentation

## 2017-12-24 DIAGNOSIS — M545 Low back pain: Secondary | ICD-10-CM | POA: Insufficient documentation

## 2017-12-24 DIAGNOSIS — G8929 Other chronic pain: Secondary | ICD-10-CM | POA: Diagnosis present

## 2017-12-24 DIAGNOSIS — M62830 Muscle spasm of back: Secondary | ICD-10-CM | POA: Insufficient documentation

## 2017-12-24 LAB — CREATININE, URINE, 24 HOUR: Creatinine, 24H Ur: 0.89 g/(24.h) (ref 0.50–2.15)

## 2017-12-24 NOTE — Therapy (Signed)
[facility] [address] [place], [place], [address] Phone: [phone]   Fax:  [phone]  Physical Therapy Treatment  Patient Details  Name: Lisa Perkins MRN: [id number] Date of Birth: [birth year] Referring Provider (PT): Shanon Ace, MD    Encounter Date: 12/24/2017  PT End of Session - 12/24/17 0844    Visit Number  31    Date for PT Re-Evaluation  04/02/18    Authorization Time Period  11/30/17 to 04/02/18    Authorization - Visit Number  31    Authorization - Number of Visits  60    PT Start Time  0801    PT Stop Time  3536    PT Time Calculation (min)  43 min    Activity Tolerance  Patient tolerated treatment well;No increased pain    Behavior During Therapy  WFL for tasks assessed/performed       Past Medical History:  Diagnosis Date  . Abnormal Pap smear of cervix    just a repeat done  . ALLERGIC RHINITIS 11/07/2007  . Asthma as child  . BENIGN NEOPLASM OF SKIN SITE UNSPECIFIED 07/25/2009  . DEPRESSION 11/07/2007  . Eczema   . GERD 11/07/2007   LPR - laryngopharyngeal reflux  . 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 TONSILLECTOMY AND ADENOIDECTOMY    . WISDOM TOOTH EXTRACTION      There were no vitals filed for this visit.  Subjective Assessment - 12/24/17 0804    Subjective  Pt states that things are going well. She is completing her HEP regularly, but these are still difficult for her.     Patient Stated Goals  neck, shoulders, and upper back is the worst    Currently in Pain?  Yes    Pain Score  3     Pain Location  Back    Pain Orientation  Right    Pain Descriptors / Indicators  Aching    Pain Type  Chronic pain    Pain Radiating Towards  none     Pain Onset  More than a month ago    Pain Frequency  Intermittent    Aggravating Factors   prolonged sitting and lifting     Pain Relieving Factors  heat, laying on side                        [facility] Adult PT Treatment/Exercise - 12/24/17 0001      Exercises   Exercises  Lumbar      Neck Exercises: Machines for Strengthening   UBE (Upper Arm Bike)  L2 x2 min forward/backward sitting on green physioball to promote trunk control      Lumbar Exercises: Machines for Strengthening   Other Lumbar Machine Exercise  lat pulldown with 25# x10 reps, #30 x10 reps       Lumbar Exercises: Sidelying   Other Sidelying Lumbar Exercises  hip abduction x15 reps each       Lumbar Exercises: Quadruped   Madcat/Old Horse  10 reps    Straight Leg Raise  10 reps    Straight Leg Raises Limitations  tactile cues    Other Quadruped Lumbar Exercises  quadruped firehydrant x5 reps each without resistance, x5 reps each with yellow TB              PT Education - 12/24/17 0840    Education Details  technique with  therex; updated HEP    Person(s) Educated  Patient    Methods  Explanation;Handout;Verbal cues    Comprehension  Returned demonstration;Verbalized understanding       PT Short Term Goals - 11/26/17 0849      PT SHORT TERM GOAL #1   Title  pt will be ind with initial HEP    Time  3    Period  Weeks    Status  Achieved      PT SHORT TERM GOAL #2   Title  The patient will have improved cervical sidebending to 40 degrees and rotation bilaterally with minimal increase in pain    Baseline  pain free cervical ROM    Time  3    Period  Weeks    Status  Achieved      PT SHORT TERM GOAL #3   Title  The patient will report a 25% improvement in pain with using the computer at work    Baseline  80% improved neck, 25% improved low back    Time  3    Period  Weeks    Status  Achieved      PT SHORT TERM GOAL #4   Title  Pt will demo improved understand of proper lifting mechanics, evident by her ability to lift a small box from the floor x5 reps without the need for cuing from PT.     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     Baseline  80% improved neck, 25% low back    Status  Revised      PT LONG TERM GOAL #3   Title  The patient will have deep cervical muscle endurance needed for maintaining better posture while working and driving for at least one hour at a time.    Baseline  Pt has good posture awareness throughout her sessions and has made necessary adjustments to her computer set up.     Time  6    Period  Weeks    Status  Achieved  PT LONG TERM GOAL #4   Title  Pt will demo improved trunk stability and strength, evident by her ability to complete deadbug exercise x5 reps with LE/UE extended without increase in lumbar lordosis or low back pain.     Time  6    Period  Weeks    Status  Achieved      PT LONG TERM GOAL #5   Title  Pt will have 5/5 MMT of the LEs to improve her safety and decrease strain on her low back with daily activity.    Time  12    Period  Weeks    Status  New    Target Date  02/20/18      Additional Long Term Goals   Additional Long Term Goals  Yes       PT LONG TERM GOAL #6   Title  Pt will demo improved trunk strength and endurance evident by her ability to complete quadruped UE/LE reach x5 reps each side without LOB or excessive trunk rotation.    Time  4    Period  Months    Status  New    Target Date  04/01/17            Plan - 12/24/17 0847    Clinical Impression Statement  Pt is doing well, having completed her HEP regularly since her last session. She demonstrated improved ability to control lumbo-pelvic movement in quadruped. She was also able to complete hip extension without significant trunk lean, although she had more difficulty on the Lt due to glute med weakness. This did improve with tactile/verbal cues from the therapist. Pt was able to complete all of today's exercises with exacerbation of low back pain and her HEP was updated to reflect improvements in trunk strength/control. Will continue with current POC to progress trunk/LE strength as able.    Rehab Potential  Excellent    PT Frequency  Monthy    PT Duration  Other (comment)   4 months   PT Treatment/Interventions  ADLs/Self Care Home Management;Biofeedback;Cryotherapy;Electrical Stimulation;Moist Heat;Ultrasound;Therapeutic activities;Therapeutic exercise;Neuromuscular re-education;Patient/family education;Manual techniques;Passive range of motion;Taping    PT Next Visit Plan  progress more quadruped (introduce UE/LE); seated trunk strength progression and trial standing hip flexion RNT; glute strength    PT Home Exercise Plan  UE: [id number]; blackburn 6s; trunk/LE strength: [id number]; flexibility [id number]    Consulted and Agree with Plan of Care  Patient       Patient will benefit from skilled therapeutic intervention in order to improve the following deficits and impairments:  Pain, Increased fascial restricitons, Increased muscle spasms, Postural dysfunction, Decreased strength, Decreased range of motion  Visit Diagnosis: Muscle spasm of back  Muscle  weakness (generalized)  Chronic low back pain, unspecified back pain laterality, unspecified whether sciatica present     Problem List Patient Active Problem List   Diagnosis Date Noted  . Medication management 06/10/2017  . Anxiety 01/31/2017  . Bipolar disorder ([place]) 01/31/2017  . Depressive disorder 01/31/2017  . Iron deficiency anemia 05/11/2015  . Allergic sinusitis 06/05/2013  . Muscle spasms of neck trapezious 04/22/2012  . GERD (gastroesophageal reflux disease) 04/22/2012  . Drug interaction 04/22/2012  . BENIGN NEOPLASM OF SKIN SITE UNSPECIFIED 07/25/2009  . DEPRESSION 11/07/2007  . ALLERGIC RHINITIS 11/07/2007  . 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 GERD 11/07/2007     9:09 AM,12/24/17 Sherol Dade PT, DPT [facility] [address], [place], [place], [address] Phone: [phone]   Fax:  [phone]  Name: Lisa Perkins MRN: [id number] Date of Birth: 1965-08-02

## 2017-12-25 ENCOUNTER — Telehealth: Payer: Self-pay | Admitting: Internal Medicine

## 2017-12-25 NOTE — Telephone Encounter (Signed)
Left message on machine for patient that there should not be a charge per [place].

## 2017-12-25 NOTE — Telephone Encounter (Signed)
Will send to rachel as she and Jacquelynn Cree were working on this this morning to correct the issue.

## 2017-12-25 NOTE — Telephone Encounter (Signed)
Lisa Perkins mentioned a no charge test OR reimbursement since this was error on our office.

## 2017-12-25 NOTE — Telephone Encounter (Signed)
Patient called about the message left on her answering machine per result notes on 12/25/17. She asks was the wrong test ordered initially. I advised that the 24 hour Urine Creatine was originally ordered and it was supposed to be 24 hour Urine Creatinine Clearance. She asks what is the difference. I advised that the 24 hour Creatinine Clearance is with blood and urine. She says the lab technician mentioned about drawing blood, but says the orders were different. She says "Creatinine test was normal in MyChart results, so is the blood work necessary to look at the kidney function at this point because I will have to pay twice for the labs." I advised this question will be sent to Dr. Regis Bill and someone will call back with her recommendation, she verbalized understanding.

## 2017-12-30 NOTE — Progress Notes (Signed)
Chief Complaint  Patient presents with  . Follow-up    Discuss labs and kidney function.    HPI: Lisa Perkins 52 y.o. come in for revoew pf ;ab tests   Has had a borderline creatinine   And  Persist  With gfr 58 - 60 range   Had 24 hours urine  .  Has been on nsaids for years once a day for back aleve or advil type meds but has since stopped .  Using PT to help  bp has always been good but  has been googling   And is  anxious ( has ocd tendencies)    So wants to review  Lab results   ROS: See pertinent positives and negatives per HPI.  Past Medical History:  Diagnosis Date  . Abnormal Pap smear of cervix    just a repeat done  . ALLERGIC RHINITIS 11/07/2007  . Asthma as child  . BENIGN NEOPLASM OF SKIN SITE UNSPECIFIED 07/25/2009  . DEPRESSION 11/07/2007  . Eczema   . GERD 11/07/2007   LPR - laryngopharyngeal reflux  . 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 Endometriosis Mother   . Hypertension Mother   . Allergic rhinitis Mother   . Food Allergy Mother        shrimp, egg white  . Diabetes Maternal Grandmother   . Hypertension Maternal Grandmother   . Stroke Maternal Grandmother   . COPD Father   . Dementia Father   . Allergic rhinitis Father   . Asthma Father   . Hypertension Maternal Grandfather   . Stroke Maternal Grandfather   . Stroke Paternal Grandmother   . Stroke Paternal Grandfather   . Diabetes Maternal Uncle   . Breast cancer Paternal Aunt 81       bilateral mastectomy - survivor  . Angioedema Neg Hx   . Eczema Neg Hx   . Urticaria Neg Hx     Social History   Socioeconomic History  . Marital status: Married    Spouse name: Not on file  . Number of children: Not on file  . Years of education: Not on file  . Highest education level: Not on file  Occupational History  . Not on file  Social Needs  . Financial resource strain: Not on file  . Food insecurity:    Worry: Not on file    Inability: Not on file    . Transportation needs:    Medical: Not on file    Non-medical: Not on file  Tobacco Use  . Smoking status: Never Smoker  . Smokeless tobacco: Never Used  Substance and Sexual Activity  . Alcohol use: Yes    Alcohol/week: 0.0 standard drinks    Comment: 1 drink per month  . Drug use: No  . Sexual activity: Not Currently    Partners: Male    Comment: husband vasectomy  Lifestyle  . Physical activity:    Days per week: Not on file    Minutes per session: Not on file  . Stress: Not on file  Relationships  . Social connections:    Talks on phone: Not on file    Gets together: Not on file    Attends religious service: Not on file    Active member of club or organization: Not on file    Attends meetings of clubs or organizations: Not on file    Relationship status: Not on file  Other Topics  Concern  . 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    Non smoker     Outpatient Medications Prior to Visit  Medication Sig Dispense Refill  . albuterol (VENTOLIN HFA) 108 (90 Base) MCG/ACT inhaler Inhale 2 puffs into the lungs every 6 (six) hours as needed for wheezing or shortness of breath. 1 Inhaler 0  . citalopram (CELEXA) 20 MG tablet Take 30 mg by mouth daily.    . clonazePAM (KLONOPIN) 0.5 MG tablet Take 0.5 mg by mouth at bedtime as needed.     Marland Kitchen ibuprofen (ADVIL,MOTRIN) 200 MG tablet Take 200 mg by mouth every 6 (six) hours as needed.    . lamoTRIgine (LAMICTAL) 150 MG tablet Take 1 tablet by mouth daily.    . lansoprazole (PREVACID) 30 MG capsule     . olopatadine (PATANOL) 0.1 % ophthalmic solution PLACE 1 DROP INTO BOTH EYES 2 (TWO) TIMES DAILY. 5 mL 2  . Pediatric Multiple Vit-C-FA (FLINSTONES GUMMIES OMEGA-3 DHA PO) Take by mouth.    . polyethylene glycol (MIRALAX / GLYCOLAX) packet Take 17 g by mouth daily.     No facility-administered medications prior to visit.      EXAM:  BP (!) 140/42 (BP Location: Right Arm, Patient  Position: Sitting, Cuff Size: Normal)   Pulse 91   Temp 98.4 F (36.9 C) (Oral)   Wt 145 lb 9.6 oz (66 kg)   BMI 24.04 kg/m   Body mass index is 24.04 kg/m.  GENERAL: vitals reviewed and listed above, alert, oriented, appears well hydrated and in no acute distress HEENT: atraumatic, conjunctiva  clear, no obvious abnormalities on inspection of external nose and ears PSYCH: pleasant and cooperative, no obvious depression or anxiety mild anxiety appropriate   ?s    Pleasant coherent  Lab Results  Component Value Date   WBC 4.9 06/10/2017   HGB 12.6 06/10/2017   HCT 37.1 06/10/2017   PLT 308.0 06/10/2017   GLUCOSE 88 12/18/2017   CHOL 183 06/10/2017   TRIG 58.0 06/10/2017   HDL 67.10 06/10/2017   LDLCALC 104 (H) 06/10/2017   ALT 11 06/10/2017   AST 13 06/10/2017   NA 136 12/18/2017   K 4.6 12/18/2017   CL 99 12/18/2017   CREATININE 1.04 (H) 12/25/2017   BUN 9 12/18/2017   CO2 30 12/18/2017   TSH 2.11 06/10/2017   BP Readings from Last 3 Encounters:  12/31/17 (!) 140/42  08/06/17 114/64  06/10/17 98/66    ASSESSMENT AND PLAN:  Discussed the following assessment and plan:  Elevated serum creatinine Cr clearance  773( nl 88 and above )   Neg protein ua on spot urine and nl ds ua other .  No hx vasculitis disease  . Agree NO nsaids  ocass tylenol if needed   Asks about prevacid but helps her a good bit    Will order renal US  assuming  Will be  Normal .   bp has been fine and up today felt from anxiety but indeed needs to be clarified with caution ( she will get her husband to check bp to avoid  Anxiety   obsessive thinking )   Will do a renal referral consult   As to opinion  About other preventable causes or other .  -Patient advised to return or notify health care team  if  new concerns arise.  Patient Instructions   Take blood pressure readings  twice a day for 5 days . And record and you can send in but if causes anxiety don't do .      You will be contacted  about   Renal ultrasound .Marland Kitchen xpect to be ok .   Can do a nephrology consult about other interventions.and assessments   Would stay  on the prevadcid for now  Because is is helping and not sure is harmful but can ask nephrologist  about this also.   But stay off all nsaids,  as you are doing.       Standley Brooking. Brockton Mckesson M.D.

## 2017-12-31 ENCOUNTER — Ambulatory Visit (INDEPENDENT_AMBULATORY_CARE_PROVIDER_SITE_OTHER): Payer: BLUE CROSS/BLUE SHIELD | Admitting: Internal Medicine

## 2017-12-31 ENCOUNTER — Encounter: Payer: Self-pay | Admitting: Internal Medicine

## 2017-12-31 VITALS — BP 140/42 | HR 91 | Temp 98.4°F | Wt 145.6 lb

## 2017-12-31 DIAGNOSIS — R030 Elevated blood-pressure reading, without diagnosis of hypertension: Secondary | ICD-10-CM

## 2017-12-31 DIAGNOSIS — R7989 Other specified abnormal findings of blood chemistry: Secondary | ICD-10-CM | POA: Diagnosis not present

## 2017-12-31 DIAGNOSIS — R944 Abnormal results of kidney function studies: Secondary | ICD-10-CM

## 2017-12-31 LAB — CREATININE CLEARANCE, URINE, 24 HOUR
Creatinine Clearance: 72 mL/min — ABNORMAL LOW (ref 88–128)
Creatinine, 24H Ur: 1071 mg/24 hr (ref 800–1800)
Creatinine, Ser: 1.04 mg/dL — ABNORMAL HIGH (ref 0.57–1.00)
Creatinine, Urine: 71.4 mg/dL
GFR calc Af Amer: 72 mL/min/{1.73_m2} (ref >59)
GFR calc non Af Amer: 63 mL/min/{1.73_m2} (ref >59)

## 2017-12-31 NOTE — Patient Instructions (Addendum)
  Take blood pressure readings twice a day for 5 days . And record and you can send in but if causes anxiety don't do .      You will be contacted about   Renal ultrasound .Marland Kitchen xpect to be ok .   Can do a nephrology consult about other interventions.and assessments   Would stay  on the prevadcid for now  Because is is helping and not sure is harmful but can ask nephrologist  about this also.   But stay off all nsaids,  as you are doing.

## 2018-01-01 ENCOUNTER — Ambulatory Visit: Payer: BLUE CROSS/BLUE SHIELD | Admitting: Family Medicine

## 2018-01-02 ENCOUNTER — Ambulatory Visit
Admission: RE | Admit: 2018-01-02 | Discharge: 2018-01-02 | Disposition: A | Discharge status: Home / Self Care | Payer: BLUE CROSS/BLUE SHIELD | Source: Ambulatory Visit | Attending: Internal Medicine | Admitting: Internal Medicine

## 2018-01-02 DIAGNOSIS — R944 Abnormal results of kidney function studies: Secondary | ICD-10-CM

## 2018-01-02 DIAGNOSIS — R7989 Other specified abnormal findings of blood chemistry: Secondary | ICD-10-CM

## 2018-01-03 ENCOUNTER — Ambulatory Visit: Payer: BLUE CROSS/BLUE SHIELD | Admitting: Family Medicine

## 2018-01-07 NOTE — Telephone Encounter (Signed)
Please advise Dr Panosh, thanks.   

## 2018-01-15 ENCOUNTER — Ambulatory Visit: Payer: BLUE CROSS/BLUE SHIELD | Admitting: Internal Medicine

## 2018-01-21 ENCOUNTER — Encounter: Payer: Self-pay | Admitting: Physical Therapy

## 2018-01-21 ENCOUNTER — Ambulatory Visit: Payer: BLUE CROSS/BLUE SHIELD | Attending: Internal Medicine | Admitting: Physical Therapy

## 2018-01-21 DIAGNOSIS — G8929 Other chronic pain: Secondary | ICD-10-CM | POA: Insufficient documentation

## 2018-01-21 DIAGNOSIS — M545 Low back pain, unspecified: Secondary | ICD-10-CM

## 2018-01-21 DIAGNOSIS — M62830 Muscle spasm of back: Secondary | ICD-10-CM | POA: Diagnosis present

## 2018-01-21 DIAGNOSIS — M6281 Muscle weakness (generalized): Secondary | ICD-10-CM | POA: Insufficient documentation

## 2018-01-21 DIAGNOSIS — M62838 Other muscle spasm: Secondary | ICD-10-CM | POA: Diagnosis present

## 2018-01-21 DIAGNOSIS — M542 Cervicalgia: Secondary | ICD-10-CM | POA: Insufficient documentation

## 2018-01-21 NOTE — Patient Instructions (Signed)
Access Code: [email or web address]  URL: [email or web address]  Date: 01/21/2018  Prepared by: Sherol Dade   Exercises  Supine Bridge on Wall - 10-15 reps - 2 sets - 1x daily - 7x weekly  Supine March with Alternating Leg Lifts - 10 reps - 1-2 sets - 1x daily - 7x weekly  Supine Bent Leg Lift with Knee Extension - 10 reps - 1-2 sets - 1x daily - 7x weekly  Quadruped Hip Abduction with Resistance Loop - 5-10 reps - 3 sets - 1x daily - 7x weekly  Bird Dog - 10 reps - 1-2 sets - 1x daily - 7x weekly  Seated Trunk Rotation with Anchored Resistance - 10-15 reps - 1-2 sets - 1x daily - 7x weekly    [facility] [address] [place], [place] [address] Phone # [phone] Fax [phone]

## 2018-01-21 NOTE — Therapy (Signed)
[facility] [address], [place], [place], [address] Phone: [phone]   Fax:  [phone]  Physical Therapy Treatment  Patient Details  Name: Lisa Perkins MRN: [id number] Date of Birth: 1965-12-17 Referring Provider (PT): Shanon Ace, MD    Encounter Date: 01/21/2018  PT End of Session - 01/21/18 0846    Visit Number  31    Date for PT Re-Evaluation  04/02/18    Authorization Time Period  11/30/17 to 04/02/18    Authorization - Visit Number  43    Authorization - Number of Visits  60    PT Start Time  0801    PT Stop Time  4315    PT Time Calculation (min)  43 min    Activity Tolerance  Patient tolerated treatment well;No increased pain    Behavior During Therapy  WFL for tasks assessed/performed       Past Medical History:  Diagnosis Date  . Abnormal Pap smear of cervix    just a repeat done  . ALLERGIC RHINITIS 11/07/2007  . Asthma as child  . BENIGN NEOPLASM OF SKIN SITE UNSPECIFIED 07/25/2009  . DEPRESSION 11/07/2007  . Eczema   . GERD 11/07/2007   LPR - laryngopharyngeal reflux  . 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 TONSILLECTOMY AND ADENOIDECTOMY    . WISDOM TOOTH EXTRACTION      There were no vitals filed for this visit.  Subjective Assessment - 01/21/18 0806    Subjective  Pt reports that things are good. She didn't have too much of an issue with her low back during her traveling.     Patient Stated Goals  neck, shoulders, and upper back is the worst    Currently in Pain?  No/denies    Pain Onset  More than a month ago                       [facility] Adult PT Treatment/Exercise - 01/21/18 0001      Exercises   Exercises  Lumbar      Lumbar Exercises: Standing   Other Standing Lumbar Exercises  standing RNT hip abduction with red TB and hip flexion from 8" box x10 reps each      Lumbar Exercises: Seated   Other Seated Lumbar  Exercises  trunk rotation with green TB and elbows extended x10 reps each direction      Lumbar Exercises: Sidelying   Hip Abduction  15 reps;Both;Weights    Hip Abduction Weights (lbs)  2    Hip Abduction Limitations  abdominal brace with initial verbal cue only       Lumbar Exercises: Quadruped   Straight Leg Raise  5 reps    Straight Leg Raises Limitations  ball on low back for cues    Opposite Arm/Leg Raise  5 reps;Left arm/Right leg;Right arm/Left leg             PT Education - 01/21/18 0843    Education Details  technique with therex; updated HEP    Person(s) Educated  Patient    Methods  Explanation;Handout;Verbal cues    Comprehension  Verbalized understanding;Returned demonstration       PT Short Term Goals - 11/26/17 0849      PT SHORT TERM GOAL #1   Title  pt will be ind with initial HEP    Time  3    Period  Weeks    Status  Achieved      PT SHORT TERM GOAL #2   Title  The patient will have improved cervical sidebending to 40 degrees and rotation bilaterally with minimal increase in pain    Baseline  pain free cervical ROM    Time  3    Period  Weeks    Status  Achieved      PT SHORT TERM GOAL #3   Title  The patient will report a 25% improvement in pain with using the computer at work    Baseline  80% improved neck, 25% improved low back    Time  3    Period  Weeks    Status  Achieved      PT SHORT TERM GOAL #4   Title  Pt will demo improved understand of proper lifting mechanics, evident by her ability to lift a small box from the floor x5 reps without the need for cuing from PT.     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     Baseline  80% improved neck, 25% low back    Status  Revised      PT LONG TERM GOAL #3   Title  The patient will have deep cervical muscle endurance needed for maintaining better posture while working and driving for at least one hour at a time.    Baseline  Pt has good posture awareness throughout her sessions and has made necessary adjustments to her computer set up.     Time  6    Period  Weeks    Status  Achieved      PT LONG TERM GOAL #4   Title  Pt will demo improved trunk stability and strength, evident by her ability to complete deadbug exercise x5 reps with LE/UE extended without increase in lumbar lordosis or low back pain.     Time  6    Period  Weeks    Status  Achieved      PT LONG TERM  GOAL #5   Title  Pt will have 5/5 MMT of the LEs to improve her safety and decrease strain on her low back with daily activity.    Time  12    Period  Weeks    Status  New    Target Date  02/20/18      Additional Long Term Goals   Additional Long Term Goals  Yes      PT LONG TERM GOAL #6   Title  Pt will demo improved trunk strength and endurance evident by her ability to complete quadruped UE/LE reach x5 reps each side without LOB or excessive trunk rotation.    Time  4    Period  Months    Status  New    Target Date  04/01/17            Plan -  01/21/18 0846    Clinical Impression Statement  Pt is making steady progress towards improved trunk strength and control. She was able to progress quadruped UE and LE reach without significant trunk rotation from last session. Introduced therex to target obliques. She did have reported muscle fatigue and weakness with this. Pt's HEP was updated and she demonstrated good understanding of proper muscle activation and technique with all additions. Will follow up with pt HEP adherence next session and plan for possible d/c at next appointment.     Rehab Potential  Excellent    PT Frequency  Monthy    PT Duration  Other (comment)   4 months   PT Treatment/Interventions  ADLs/Self Care Home Management;Biofeedback;Cryotherapy;Electrical Stimulation;Moist Heat;Ultrasound;Therapeutic activities;Therapeutic exercise;Neuromuscular re-education;Patient/family education;Manual techniques;Passive range of motion;Taping    PT Next Visit Plan  f/u on quad LE/UE; standing hip flexion RNT; glute strength progression    PT Home Exercise Plan  UE: [id number]; blackburn 6s; trunk/LE strength: [id number]; flexibility [id number]    Consulted and Agree with Plan of Care  Patient       Patient will benefit from skilled therapeutic intervention in order to improve the following deficits and impairments:  Pain, Increased fascial restricitons, Increased muscle spasms, Postural dysfunction, Decreased strength, Decreased range of motion  Visit Diagnosis: Muscle spasm of back  Muscle weakness (generalized)  Chronic low back pain, unspecified back pain laterality, unspecified whether sciatica present  Cervicalgia  Other muscle spasm     Problem List Patient Active Problem List   Diagnosis Date Noted  . Medication management 06/10/2017  . Anxiety 01/31/2017  . Bipolar disorder ([place]) 01/31/2017  . Depressive disorder 01/31/2017  . Iron deficiency anemia 05/11/2015  . Allergic sinusitis 06/05/2013  . Muscle spasms of  neck trapezious 04/22/2012  . GERD (gastroesophageal reflux disease) 04/22/2012  . Drug interaction 04/22/2012  . BENIGN NEOPLASM OF SKIN SITE UNSPECIFIED 07/25/2009  . DEPRESSION 11/07/2007  . ALLERGIC RHINITIS 11/07/2007  . 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 GERD 11/07/2007     8:54 AM,01/21/18 Sherol Dade PT, DPT [place] [facility] [address] [place], [place], [address] Phone: [phone]   Fax:  [phone]  Name: Lisa Perkins MRN: [id number] Date of Birth: [birth year]

## 2018-01-22 LAB — HEPATIC FUNCTION PANEL
ALT: 7 (ref 7–35)
AST: 12 — AB (ref 13–35)
Alkaline Phosphatase: 55 (ref 25–125)

## 2018-01-22 LAB — BASIC METABOLIC PANEL
BUN: 11 (ref 4–21)
Creatinine: 1.1 (ref 0.5–1.1)
Glucose: 100
Potassium: 4.7 (ref 3.4–5.3)
Sodium: 137 (ref 137–147)

## 2018-01-22 NOTE — Telephone Encounter (Signed)
Please advise Dr Panosh, thanks.   

## 2018-02-03 ENCOUNTER — Other Ambulatory Visit (HOSPITAL_COMMUNITY)
Admission: RE | Admit: 2018-02-03 | Discharge: 2018-02-03 | Disposition: A | Discharge status: Home / Self Care | Payer: BLUE CROSS/BLUE SHIELD | Source: Ambulatory Visit | Attending: Obstetrics and Gynecology | Admitting: Obstetrics and Gynecology

## 2018-02-03 ENCOUNTER — Ambulatory Visit (INDEPENDENT_AMBULATORY_CARE_PROVIDER_SITE_OTHER): Payer: BLUE CROSS/BLUE SHIELD | Admitting: Obstetrics and Gynecology

## 2018-02-03 ENCOUNTER — Other Ambulatory Visit: Payer: Self-pay

## 2018-02-03 ENCOUNTER — Encounter: Payer: Self-pay | Admitting: Obstetrics and Gynecology

## 2018-02-03 VITALS — BP 102/60 | HR 76 | Resp 18 | Ht 66.0 in | Wt 144.6 lb

## 2018-02-03 DIAGNOSIS — N951 Menopausal and female climacteric states: Secondary | ICD-10-CM

## 2018-02-03 DIAGNOSIS — Z01419 Encounter for gynecological examination (general) (routine) without abnormal findings: Secondary | ICD-10-CM | POA: Diagnosis present

## 2018-02-03 NOTE — Progress Notes (Signed)
52 y.o. G33P0000 Married Caucasian female here for annual exam.    States she is perimenopausal.  Had pelvic US and benign EMB in December 2018, which were normal and benign.  Did take Provera and did not have emotional changes, so she did not take any of this in follow up.   Last menses was July 2019.  No spotting since then.   Saw nephrology due to decreased GFR and scarring on her kidneys noted on a renal ultrasound.   Wants vit B12 and vit D levels checked.   PCP:  Shanon Ace, MD   Patient's last menstrual period was 09/02/2017 (exact date).     Period Pattern: (!) Irregular     Sexually active: No.  The current method of family planning is abstinence.    Exercising: Yes.    Elliptical Smoker:  no  Health Maintenance: Pap: 10-22-14 Neg:Neg HR HPV, 08-10-11 Neg History of abnormal Pap:  no MMG:  07-20-17 3D Neg/density C/BiRads1 Colonoscopy:  12-29-14 normal;next 10 years BMD:   n/a  Result  n/a TDaP: 2011 Gardasil:   no HIV:no Hep C:no Screening Labs:  Hb today: PCP   reports that she has never smoked. She has never used smokeless tobacco. She reports previous alcohol use. She reports that she does not use drugs.  Past Medical History:  Diagnosis Date  . Abnormal Pap smear of cervix    just a repeat done  . ALLERGIC RHINITIS 11/07/2007  . Asthma as child  . BENIGN NEOPLASM OF SKIN SITE UNSPECIFIED 07/25/2009  . DEPRESSION 11/07/2007  . Eczema   . GERD 11/07/2007   LPR - laryngopharyngeal reflux  . 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 TONSILLECTOMY AND ADENOIDECTOMY    . WISDOM TOOTH EXTRACTION      Current Outpatient Medications  Medication Sig Dispense Refill  . albuterol (VENTOLIN HFA) 108 (90 Base) MCG/ACT inhaler Inhale 2 puffs into the lungs every 6 (six) hours as needed for wheezing or shortness of breath. 1 Inhaler 0  . citalopram (CELEXA) 20 MG tablet Take 30 mg by mouth daily.    . clonazePAM (KLONOPIN) 0.5 MG  tablet Take 0.5 mg by mouth at bedtime as needed.     . lamoTRIgine (LAMICTAL) 150 MG tablet Take 1 tablet by mouth daily.    . lansoprazole (PREVACID) 30 MG capsule     . olopatadine (PATANOL) 0.1 % ophthalmic solution PLACE 1 DROP INTO BOTH EYES 2 (TWO) TIMES DAILY. 5 mL 2  . Pediatric Multiple Vit-C-FA (FLINSTONES GUMMIES OMEGA-3 DHA PO) Take by mouth.    . polyethylene glycol (MIRALAX / GLYCOLAX) packet Take 17 g by mouth daily.     No current facility-administered medications for this visit.     Family History  Problem Relation Age of Onset  . Endometriosis Mother   . Hypertension Mother   . Allergic rhinitis Mother   . Food Allergy Mother        shrimp, egg white  . Diabetes Maternal Grandmother   . Hypertension Maternal Grandmother   . Stroke Maternal Grandmother   . COPD Father   . Dementia Father   . Allergic rhinitis Father   . Asthma Father   . Hypertension Maternal Grandfather   . Stroke Maternal Grandfather   . Stroke Paternal Grandmother   . Stroke Paternal Grandfather   . Diabetes Maternal Uncle   . Breast cancer Paternal Aunt 72       bilateral  mastectomy - survivor  . Angioedema Neg Hx   . Eczema Neg Hx   . Urticaria Neg Hx     Review of Systems  All other systems reviewed and are negative.   Exam:   BP 102/60 (BP Location: Right Arm, Patient Position: Sitting, Cuff Size: Normal)   Pulse 76   Resp 18   Ht 5\' 6"  (1.676 m)   Wt 144 lb 9.6 oz (65.6 kg)   LMP 09/02/2017 (Exact Date)   BMI 23.34 kg/m     General appearance: alert, cooperative and appears stated age Head: Normocephalic, without obvious abnormality, atraumatic Neck: no adenopathy, supple, symmetrical, trachea midline and thyroid normal to inspection and palpation Lungs: clear to auscultation bilaterally Breasts: normal appearance, no masses or tenderness, No nipple retraction or dimpling, No nipple discharge or bleeding, No axillary or supraclavicular adenopathy Heart: regular rate and  rhythm Abdomen: soft, non-tender; no masses, no organomegaly Extremities: extremities normal, atraumatic, no cyanosis or edema Skin: Skin color, texture, turgor normal. No rashes or lesions Lymph nodes: Cervical, supraclavicular, and axillary nodes normal. No abnormal inguinal nodes palpated Neurologic: Grossly normal  Pelvic: External genitalia:  no lesions              Urethra:  normal appearing urethra with no masses, tenderness or lesions              Bartholins and Skenes: normal                 Vagina: normal appearing vagina with normal color and discharge, no lesions              Cervix: no lesions              Pap taken: Yes.   Bimanual Exam:  Uterus:  normal size, contour, position, consistency, mobility, non-tender              Adnexa: no mass, fullness, tenderness              Rectal exam: Yes.  .  Confirms.              Anus:  normal sphincter tone, no lesions  Chaperone was present for exam.  Assessment:   Well woman visit with normal exam. Perimenopausal female. Anxiety.  Abnormal vaginal bleeding resolved.  Intolerance to Provera.   Plan: Mammogram screening. Recommended self breast awareness. Pap and HR HPV as above. Guidelines for Calcium, Vitamin D, regular exercise program including cardiovascular and weight bearing exercise. [place]ner, E2.  Consider Prometrium 200 mg x 12 days.  Will also check Vit D and B12. Follow up annually and prn.   After visit summary provided.

## 2018-02-03 NOTE — Patient Instructions (Signed)

## 2018-02-04 ENCOUNTER — Ambulatory Visit: Payer: BLUE CROSS/BLUE SHIELD | Admitting: Obstetrics and Gynecology

## 2018-02-04 LAB — ESTRADIOL: Estradiol: 47 pg/mL

## 2018-02-04 LAB — VITAMIN B12: Vitamin B-12: 448 pg/mL (ref 232–1245)

## 2018-02-04 LAB — VITAMIN D 25 HYDROXY (VIT D DEFICIENCY, FRACTURES): Vit D, 25-Hydroxy: 34.5 ng/mL (ref 30.0–100.0)

## 2018-02-04 LAB — FOLLICLE STIMULATING HORMONE: FSH: 101.6 m[IU]/mL

## 2018-02-05 ENCOUNTER — Ambulatory Visit: Payer: BLUE CROSS/BLUE SHIELD | Admitting: Certified Nurse Midwife

## 2018-02-05 LAB — CYTOLOGY - PAP
Diagnosis: NEGATIVE
HPV: NOT DETECTED

## 2018-02-18 ENCOUNTER — Encounter: Payer: Self-pay | Admitting: Physical Therapy

## 2018-02-18 ENCOUNTER — Ambulatory Visit: Payer: BLUE CROSS/BLUE SHIELD | Admitting: Physical Therapy

## 2018-02-18 ENCOUNTER — Ambulatory Visit: Payer: BLUE CROSS/BLUE SHIELD | Admitting: Certified Nurse Midwife

## 2018-02-18 DIAGNOSIS — M62830 Muscle spasm of back: Secondary | ICD-10-CM

## 2018-02-18 DIAGNOSIS — M545 Low back pain: Secondary | ICD-10-CM

## 2018-02-18 DIAGNOSIS — M6281 Muscle weakness (generalized): Secondary | ICD-10-CM

## 2018-02-18 DIAGNOSIS — G8929 Other chronic pain: Secondary | ICD-10-CM

## 2018-02-18 NOTE — Therapy (Signed)
[facility] [address], [place], [place], [address] Phone: [phone]   Fax:  [phone]  Physical Therapy Treatment/Discharge  Patient Details  Name: Lisa Perkins MRN: [id number] Date of Birth: 04-27-1965 Referring Provider (PT): Shanon Ace, MD    Encounter Date: 02/18/2018  PT End of Session - 02/18/18 0854    Visit Number  33    Date for PT Re-Evaluation  04/02/18    Authorization Time Period  11/30/17 to 04/02/18    Authorization - Visit Number  33    Authorization - Number of Visits  60    PT Start Time  0801    PT Stop Time  0845    PT Time Calculation (min)  44 min    Activity Tolerance  Patient tolerated treatment well;No increased pain    Behavior During Therapy  WFL for tasks assessed/performed       Past Medical History:  Diagnosis Date  . Abnormal Pap smear of cervix    just a repeat done  . ALLERGIC RHINITIS 11/07/2007  . Asthma as child  . BENIGN NEOPLASM OF SKIN SITE UNSPECIFIED 07/25/2009  . DEPRESSION 11/07/2007  . Eczema   . GERD 11/07/2007   LPR - laryngopharyngeal reflux  . 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 TONSILLECTOMY AND ADENOIDECTOMY    . WISDOM TOOTH EXTRACTION      There were no vitals filed for this visit.  Subjective Assessment - 02/18/18 0802    Subjective  Pt reports that things are going well. She has been working on her HEP and has a tough time keeping her back flat. She went to a chiropractor who told her she has scoliosis. She had a lot of driving back and forth from [place] over the past week which made her back a little sore.     Patient Stated Goals  neck, shoulders, and upper back is the worst    Currently in Pain?  Yes   no rating given   Pain Onset  More than a month ago         [facility] PT Assessment - 02/18/18 0001      Assessment   Medical Diagnosis  M62.838 (ICD-10-CM) - Muscle spasms of neck    Referring Provider (PT)  Shanon Ace, MD     Onset Date/Surgical Date  --   20 years of low back pain    Hand Dominance  Right    Prior Therapy  Yes      Precautions   Precautions  None      Restrictions   Weight Bearing Restrictions  No      Home Environment   Living Environment  Private residence    Living Arrangements  Spouse/significant other      Prior Function   Level of Independence  Independent    Vocation  Full time employment    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      Cognition   Overall Cognitive Status  Within Functional Limits for tasks assessed      Observation/Other Assessments   Focus on Therapeutic Outcomes (FOTO)   25% limited (neck)      AROM   Lumbar Flexion  decreased lumbar curvature, pain coming back up    Lumbar Extension  75% limited, mild discomfort end range     Lumbar - Right Rotation  limited, pain end range     Lumbar -  Left Rotation  limited, pain end range       PROM   Overall PROM Comments  Rt and Lt hip 40 deg IR      Strength   Overall Strength Comments  B hip abduction and extension 5/5 MMT       Palpation   Spinal mobility  hypomobile lumbar spine, tender along L3/L4       Ambulation/Gait   Gait Pattern  Within Functional Limits                   [facility] Adult PT Treatment/Exercise - 02/18/18 0001      Lumbar Exercises: Standing   Other Standing Lumbar Exercises  tandem pallof press x5 reps each direction with Lt and Rt LE forward using red TB      Lumbar Exercises: Supine   Other Supine Lumbar Exercises  dead bug x5 reps each without back support      Lumbar Exercises: Quadruped   Opposite Arm/Leg Raise  5 reps;Left arm/Right leg;Right arm/Left leg    Opposite Arm/Leg Raise Limitations  mirror feedback              PT Education - 02/18/18 0853    Education Details  updated HEP; reviewed goals and importance of continued HEP adherence    Person(s) Educated  Patient    Methods   Explanation;Verbal cues;Demonstration;Handout    Comprehension  Verbalized understanding;Returned demonstration       PT Short Term Goals - 02/18/18 0820      PT SHORT TERM GOAL #1   Title  pt will be ind with initial HEP    Time  3    Period  Weeks    Status  Achieved      PT SHORT TERM GOAL #2   Title  The patient will have improved cervical sidebending to 40 degrees and rotation bilaterally with minimal increase in pain    Baseline  pain free cervical ROM    Time  3    Period  Weeks    Status  Achieved      PT SHORT TERM GOAL #3   Title  The patient will report a 25% improvement in pain with using the computer at work    Baseline  80% improved neck, 25% improved low back    Time  3    Period  Weeks    Status  Achieved      PT SHORT TERM GOAL #4   Title  Pt will demo improved understand of proper lifting mechanics, evident by her ability to lift a small box from the floor x5 reps without the need for cuing from PT.     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     Baseline  80% improved neck, 25% low back    Status  Revised      PT LONG TERM GOAL #3   Title  The patient will have deep cervical  muscle endurance needed for maintaining better posture while working and driving for at least one hour at a time.    Baseline  Pt has good posture awareness throughout her sessions and has made necessary adjustments to her computer set up.     Time  6    Period  Weeks    Status  Achieved      PT LONG TERM GOAL #4   Title  Pt will demo improved trunk stability and strength, evident by her ability to complete deadbug exercise x5 reps with LE/UE extended without increase in lumbar lordosis or low back pain.     Baseline  able to complete without pain as long as low back is supported    Time  6    Period  Weeks    Status  Achieved      PT LONG TERM GOAL #5   Title  Pt will have 5/5 MMT of the LEs to improve her safety and decrease strain on her low back with daily activity.    Time  12    Period  Weeks    Status  New      PT LONG TERM GOAL #6   Title  Pt will demo improved trunk strength and endurance evident by her ability to complete quadruped UE/LE reach x5 reps each side without LOB or excessive trunk rotation.    Time  4    Period  Months    Status  New            Plan - 02/18/18 0854    Clinical Impression Statement  Pt was discharged this visit having met nearly all of her short and long term goals. She is trying to complete her HEP regularly, although sometimes does not get to her stretches as much as she would like. Her hip strength is full and pain free and her hip internal rotation has increased by 20 deg. This has helped with her mechanics during lifting, as she demonstrated no compensatory patterns of excessive trunk flexion or extension during today's reassessment. Pt's trunk strength and muscle activations has significantly improved as well and she is now able to complete quadruped and other standing exercises without excessive trunk flexion and with minimal to no cues from the therapist for correction of technique. Pt is pleased with her progress so far and should  continue to see strenght, flexibility and neuromuscular control gains moving forward as long as she continues to complete her advanced HEP reviewed today's session.     Rehab Potential  Excellent    PT Frequency  Monthy    PT Duration  Other (comment)   4 months   PT Treatment/Interventions  ADLs/Self Care Home Management;Biofeedback;Cryotherapy;Electrical Stimulation;Moist Heat;Ultrasound;Therapeutic activities;Therapeutic exercise;Neuromuscular re-education;Patient/family education;Manual techniques;Passive range of motion;Taping    PT Next Visit Plan  d/c home with HEP    PT Home  Exercise Plan  UE: [id number]; blackburn 6s; trunk/LE strength: [id number]; flexibility [id number]    Consulted and Agree with Plan of Care  Patient       Patient will benefit from skilled therapeutic intervention in order to improve the following deficits and impairments:  Pain, Increased fascial restricitons, Increased muscle spasms, Postural dysfunction, Decreased strength, Decreased range of motion  Visit Diagnosis: Muscle spasm of back  Muscle weakness (generalized)  Chronic low back pain, unspecified back pain laterality, unspecified whether sciatica present    PHYSICAL THERAPY DISCHARGE SUMMARY  Visits from Start of Care: 33  Current functional level related to goals / functional outcomes: See above for more details    Remaining deficits: See above for more details    Education / Equipment: See above for more details   Plan: Patient agrees to discharge.  Patient goals were partially met. Patient is being discharged due to being pleased with the current functional level.  ?????        Problem List Patient Active Problem List   Diagnosis Date Noted  . Medication management 06/10/2017  . Anxiety 01/31/2017  . Bipolar disorder ([place]) 01/31/2017  . Depressive disorder 01/31/2017  . Iron deficiency anemia 05/11/2015  . Allergic sinusitis 06/05/2013  . Muscle spasms of neck trapezious  04/22/2012  . GERD (gastroesophageal reflux disease) 04/22/2012  . Drug interaction 04/22/2012  . BENIGN NEOPLASM OF SKIN SITE UNSPECIFIED 07/25/2009  . DEPRESSION 11/07/2007  . ALLERGIC RHINITIS 11/07/2007  . 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 GERD 11/07/2007    10:03 AM,02/18/18 Sherol Dade PT, DPT [place] [facility] [address], [place], [place], [address] Phone: [phone]   Fax:  [phone]  Name: Lisa Perkins MRN: [id number] Date of Birth: 12/17/1965

## 2018-03-18 ENCOUNTER — Encounter: Payer: BLUE CROSS/BLUE SHIELD | Admitting: Physical Therapy

## 2018-03-26 NOTE — Progress Notes (Signed)
Chief Complaint  Patient presents with  . Follow-up    HPI: Lisa Perkins 53 y.o. 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 Scoliosis    .   Went to [facility]   And did x ray s   oder therapy scolis  Technique . Would like rx for specialized technique in [place]   Is doing more  piilates   And core .   fyi has seen renal and stable  Follow   No intervention x healthy diet  ROS: See pertinent positives and negatives per HPI.  Past Medical History:  Diagnosis Date  . Abnormal Pap smear of cervix    just a repeat done  . ALLERGIC RHINITIS 11/07/2007  . Asthma as child  . BENIGN NEOPLASM OF SKIN SITE UNSPECIFIED 07/25/2009  . DEPRESSION 11/07/2007  . Eczema   . GERD 11/07/2007   LPR - laryngopharyngeal reflux  . 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 Endometriosis Mother   . Hypertension Mother   . Allergic rhinitis Mother   . Food Allergy Mother        shrimp, egg white  . Diabetes Maternal Grandmother   . Hypertension Maternal Grandmother   . Stroke Maternal Grandmother   . COPD Father   . Dementia Father   . Allergic rhinitis Father   . Asthma Father   . Hypertension Maternal Grandfather   . Stroke Maternal Grandfather   . Stroke Paternal Grandmother   . Stroke Paternal Grandfather   . Diabetes Maternal Uncle   . Breast cancer Paternal Aunt 29       bilateral mastectomy - survivor  . Angioedema Neg Hx   . Eczema Neg Hx   . Urticaria Neg Hx     Social History   Socioeconomic History  . Marital status: Married    Spouse name: Not on file  . Number of children: Not on file  . Years of education: Not on file  . Highest education level: Not on file  Occupational History  . Not on file  Social Needs  . Financial resource strain: Not on file  . Food insecurity:    Worry: Not on  file    Inability: Not on file  . Transportation needs:    Medical: Not on file    Non-medical: Not on file  Tobacco Use  . Smoking status: Never Smoker  . Smokeless tobacco: Never Used  Substance and Sexual Activity  . Alcohol use: Not Currently    Alcohol/week: 0.0 standard drinks  . Drug use: No  . Sexual activity: Not Currently    Partners: Male    Birth control/protection: Abstinence    Comment: husband vasectomy/husband with Pyronis  Lifestyle  . Physical activity:    Days per week: Not on file    Minutes per session: Not on file  . Stress: Not on file  Relationships  . Social connections:    Talks on phone: Not on file    Gets together: Not on file    Attends religious service: Not on file    Active member of club or organization: Not on file    Attends meetings of clubs or organizations: Not on file    Relationship  status: Not on file  Other Topics Concern  . 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    Non smoker     Outpatient Medications Prior to Visit  Medication Sig Dispense Refill  . albuterol (VENTOLIN HFA) 108 (90 Base) MCG/ACT inhaler Inhale 2 puffs into the lungs every 6 (six) hours as needed for wheezing or shortness of breath. 1 Inhaler 0  . citalopram (CELEXA) 20 MG tablet Take 30 mg by mouth daily.    . clonazePAM (KLONOPIN) 0.5 MG tablet Take 0.5 mg by mouth at bedtime as needed.     . lamoTRIgine (LAMICTAL) 150 MG tablet Take 1 tablet by mouth daily.    . lansoprazole (PREVACID) 30 MG capsule     . olopatadine (PATANOL) 0.1 % ophthalmic solution PLACE 1 DROP INTO BOTH EYES 2 (TWO) TIMES DAILY. 5 mL 2  . Pediatric Multiple Vit-C-FA (FLINSTONES GUMMIES OMEGA-3 DHA PO) Take by mouth.    . polyethylene glycol (MIRALAX / GLYCOLAX) packet Take 17 g by mouth daily.     No facility-administered medications prior to visit.      EXAM:  BP 118/70 (BP Location: Left Arm, Patient Position: Sitting, Cuff  Size: Normal)   Pulse 93   Temp 98.3 F (36.8 C) (Oral)   Ht 5' 6.5" (1.689 m)   Wt 144 lb 9.6 oz (65.6 kg)   SpO2 95%   BMI 22.99 kg/m   Body mass index is 22.99 kg/m.  GENERAL: vitals reviewed and listed above, alert, oriented, appears well hydrated and in no acute distress HEENT: atraumatic, conjunctiva  clear, no obvious abnormalities on inspection of external nose and ears NECK: no obvious masses on inspection palpation   has mild dright  righ hump  And kyphosis   No pint tenderness  MS: moves all extremities without noticeable focal  abnormality PSYCH: pleasant and cooperative, no obvious depression or anxiety  BP Readings from Last 3 Encounters:  03/27/18 118/70  02/03/18 102/60  12/31/17 (!) 140/42    ASSESSMENT AND PLAN:  Discussed the following assessment and plan:  Scoliosis (and kyphoscoliosis), idiopathic  Back pain, unspecified back location, unspecified back pain laterality, unspecified chronicity Agree with  Specialized PT    Order hand written   Counseled  Discussion.  -Patient advised to return or notify health care team  if  new concerns arise.  Patient Instructions  I agree with continued    Exercises and scoliosis  Scoliosis  Scoliosis is a condition in which the spine curves sideways. Normally, the spine does not curve side-to-side (laterally). With scoliosis, the spine may curve to the left, to the right, or in both directions. The curve of the spine is measured by angles in degrees. Scoliosis can affect people at any age, but it is more common among children and adolescents. What are the causes? The cause of scoliosis is not always known. It may be caused by:  A birth defect.  A disease that can cause problems in the muscles or imbalance of the body, such as cerebral palsy or muscular dystrophy. What are the signs or symptoms? This condition may not cause any symptoms. If you do have symptoms, they may include:  Leaning to one side.  Sunken  chest and uneven shoulders.  One side of the body being different or larger than the other side (asymmetry).  An abnormal curve in the back.  Pain, which may limit physical activity.  Shortness of breath.  Bowel or bladder control problems, such as not knowing when you have to go. This can be a sign of nerve damage. How is this diagnosed? This condition is diagnosed based on:  Your medical history.  Your symptoms.  A physical exam. This may include: ? Examining your nerves, muscles, and reflexes (neurological exam). ? Testing the movement of your spine (range of motion study).  Imaging tests, such as: ? X-rays. ? MRI. How is this treated? Treatment for this condition depends on the severity of the symptoms. Treatment may include:  Observation to make sure that your scoliosis does not get worse (progress). You may need to have regular visits with your health care provider.  A back brace to prevent scoliosis from progressing. This may be needed during times of fast growth (growth spurts), such as during adolescence.  Medicine to help relieve pain.  Physical therapy.  Surgery. Follow these instructions at home: If you have a brace:  Wear the brace as told by your health care provider. Remove it only as told by your health care provider.  Loosen the brace if your fingers or toes tingle, become numb, or turn cold and blue.  Keep the brace clean.  If the brace is not waterproof: ? Do not let it get wet. ? Cover it with a watertight covering when you take a bath or a shower. General instructions  Take over-the-counter and prescription medicines only as told by your health care provider.  Donot drive or use heavy machinery while taking prescription pain medicine.  If physical therapy was prescribed, do exercises as instructed.  Before starting any new sports or physical activities, ask your health care provider whether they are safe for you.  Keep all follow-up  visits as told by your health care provider. This is important. Contact a health care provider if you have:  Problems with your back brace, such as skin irritation or discomfort.  Back pain that does not get better with medicine. Get help right away if:  Your legs feel weak.  You cannot move your legs.  You cannot control when you urinate or pass stool (loss of bladder or bowel control). Summary  Scoliosis is a condition of having a spine that curves sideways. The spine may curve to the left, to the right, or in both directions.  This condition may be caused by birth defects or diseases that affect muscles and body balance.  Follow your health care provider's instructions about wearing a brace, doing physical activities, and keeping follow-up visits. This information is not intended to replace advice given to you by your health care provider. Make sure you discuss any questions you have with your health care provider. Document Released: 02/03/2000 Document Revised: 07/08/2017 Document Reviewed: 05/23/2017 Elsevier Interactive Patient Education  [employer].    As a diagnosis .      Standley Brooking. Panosh M.D.

## 2018-03-27 ENCOUNTER — Ambulatory Visit (INDEPENDENT_AMBULATORY_CARE_PROVIDER_SITE_OTHER): Payer: BLUE CROSS/BLUE SHIELD | Admitting: Internal Medicine

## 2018-03-27 ENCOUNTER — Encounter: Payer: Self-pay | Admitting: Internal Medicine

## 2018-03-27 VITALS — BP 118/70 | HR 93 | Temp 98.3°F | Ht 66.5 in | Wt 144.6 lb

## 2018-03-27 DIAGNOSIS — M549 Dorsalgia, unspecified: Secondary | ICD-10-CM

## 2018-03-27 DIAGNOSIS — M4120 Other idiopathic scoliosis, site unspecified: Secondary | ICD-10-CM | POA: Diagnosis not present

## 2018-03-27 NOTE — Patient Instructions (Addendum)
I agree with continued    Exercises and scoliosis  Scoliosis  Scoliosis is a condition in which the spine curves sideways. Normally, the spine does not curve side-to-side (laterally). With scoliosis, the spine may curve to the left, to the right, or in both directions. The curve of the spine is measured by angles in degrees. Scoliosis can affect people at any age, but it is more common among children and adolescents. What are the causes? The cause of scoliosis is not always known. It may be caused by:  A birth defect.  A disease that can cause problems in the muscles or imbalance of the body, such as cerebral palsy or muscular dystrophy. What are the signs or symptoms? This condition may not cause any symptoms. If you do have symptoms, they may include:  Leaning to one side.  Sunken chest and uneven shoulders.  One side of the body being different or larger than the other side (asymmetry).  An abnormal curve in the back.  Pain, which may limit physical activity.  Shortness of breath.  Bowel or bladder control problems, such as not knowing when you have to go. This can be a sign of nerve damage. How is this diagnosed? This condition is diagnosed based on:  Your medical history.  Your symptoms.  A physical exam. This may include: ? Examining your nerves, muscles, and reflexes (neurological exam). ? Testing the movement of your spine (range of motion study).  Imaging tests, such as: ? X-rays. ? MRI. How is this treated? Treatment for this condition depends on the severity of the symptoms. Treatment may include:  Observation to make sure that your scoliosis does not get worse (progress). You may need to have regular visits with your health care provider.  A back brace to prevent scoliosis from progressing. This may be needed during times of fast growth (growth spurts), such as during adolescence.  Medicine to help relieve pain.  Physical therapy.  Surgery. Follow  these instructions at home: If you have a brace:  Wear the brace as told by your health care provider. Remove it only as told by your health care provider.  Loosen the brace if your fingers or toes tingle, become numb, or turn cold and blue.  Keep the brace clean.  If the brace is not waterproof: ? Do not let it get wet. ? Cover it with a watertight covering when you take a bath or a shower. General instructions  Take over-the-counter and prescription medicines only as told by your health care provider.  Donot drive or use heavy machinery while taking prescription pain medicine.  If physical therapy was prescribed, do exercises as instructed.  Before starting any new sports or physical activities, ask your health care provider whether they are safe for you.  Keep all follow-up visits as told by your health care provider. This is important. Contact a health care provider if you have:  Problems with your back brace, such as skin irritation or discomfort.  Back pain that does not get better with medicine. Get help right away if:  Your legs feel weak.  You cannot move your legs.  You cannot control when you urinate or pass stool (loss of bladder or bowel control). Summary  Scoliosis is a condition of having a spine that curves sideways. The spine may curve to the left, to the right, or in both directions.  This condition may be caused by birth defects or diseases that affect muscles and body balance.  Follow  your health care provider's instructions about wearing a brace, doing physical activities, and keeping follow-up visits. This information is not intended to replace advice given to you by your health care provider. Make sure you discuss any questions you have with your health care provider. Document Released: 02/03/2000 Document Revised: 07/08/2017 Document Reviewed: 05/23/2017 Elsevier Interactive Patient Education  [employer].    As a diagnosis .

## 2018-07-10 ENCOUNTER — Encounter: Payer: Self-pay | Admitting: Internal Medicine

## 2018-08-06 ENCOUNTER — Ambulatory Visit: Payer: Self-pay | Admitting: Internal Medicine

## 2018-08-06 NOTE — Telephone Encounter (Signed)
Pt wanted to wait to schedule virtual visit and stated she could go somewhere else for testing

## 2018-08-06 NOTE — Telephone Encounter (Signed)
  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 Requesting an appt with Dr. Regis Bill for COVID testing.  I transferred the call to [facility] in Dr. Velora Mediate office to be scheduled.  I sent my notes to the office.  Reason for Disposition . [1] COVID-19 EXPOSURE (Close Contact) within last 14 days AND [2] needs COVID-19 lab test to return to work AND [3] NO symptoms    Having diarrhea  Answer Assessment - Initial Assessment Questions 1. CLOSE CONTACT: "Who is the person with the confirmed or suspected COVID-19 infection that you were exposed to?"     Exposed to pt Jul 20, 2018 with COVID at [facility] where I work 2. PLACE of CONTACT: "Where were you when you were exposed to COVID-19?" (e.g., home, school, medical waiting room; which city?)     Work 3. TYPE of CONTACT: "How much contact was there?" (e.g., sitting next to, live in same house, work in same office, same building)     I'm speech therapist was in room about 30 min doing a swallow eval. 4. DURATION of CONTACT: "How long were you in contact with the COVID-19 patient?" (e.g., a few seconds, passed by person, a few minutes, live with the patient)     30 min 5. DATE of CONTACT: "When did you have contact with a COVID-19 patient?" (e.g., how many days ago)     Jul 20, 2018 6. TRAVEL: "Have you traveled out of the country recently?" If so, "When and where?"     * Also ask about out-of-state travel, since the CDC has identified some high-risk cities for community spread in the [place].     * Note: Travel becomes less relevant if there is widespread community transmission where the patient lives.     No 7. COMMUNITY SPREAD: "Are there lots of cases of COVID-19 (community spread) where you live?" (See public health department website, if unsure)       Minor in community 8. SYMPTOMS: "Do you have  any symptoms?" (e.g., fever, cough, breathing difficulty)     Diarrhea only 9. PREGNANCY OR POSTPARTUM: "Is there any chance you are pregnant?" "When was your last menstrual period?" "Did you deliver in the last 2 weeks?"     No 10. HIGH RISK: "Do you have any heart or lung problems? Do you have a weak immune system?" (e.g., CHF, COPD, asthma, HIV positive, chemotherapy, renal failure, diabetes mellitus, sickle cell anemia)       I have asthma.  Protocols used: CORONAVIRUS (COVID-19) EXPOSURE-A-AH

## 2019-01-06 ENCOUNTER — Other Ambulatory Visit: Payer: Self-pay

## 2019-01-06 ENCOUNTER — Telehealth: Payer: Self-pay | Admitting: *Deleted

## 2019-01-06 DIAGNOSIS — N289 Disorder of kidney and ureter, unspecified: Secondary | ICD-10-CM

## 2019-01-06 NOTE — Telephone Encounter (Signed)
OK to refer but  They will Need more information.   And dx   She does need  Updated renal function  Evaluation     Is that the reason for referral? ?renal insufficieny  ( she had a local renal  Consult  In December 2019 )

## 2019-01-06 NOTE — Telephone Encounter (Signed)
Copied from [place] [phone]. Topic: Referral - Request for Referral >> Jan 06, 2019  9:48 AM Rainey Pines A wrote: Has patient seen PCP for this complaint? Yes *If NO, is insurance requiring patient see PCP for this issue before PCP can refer them? Referral for which specialty: Nephrology  Preferred provider/office: [facility] fax number [phone]

## 2019-01-06 NOTE — Telephone Encounter (Signed)
Please advise 

## 2019-01-06 NOTE — Telephone Encounter (Signed)
Pt has been notified that referral has been placed.  

## 2019-02-01 ENCOUNTER — Emergency Department (HOSPITAL_COMMUNITY)
Admission: EM | Admit: 2019-02-01 | Discharge: 2019-02-02 | Disposition: A | Discharge status: Home / Self Care | Payer: No Typology Code available for payment source | Attending: Emergency Medicine | Admitting: Emergency Medicine

## 2019-02-01 ENCOUNTER — Other Ambulatory Visit: Payer: Self-pay

## 2019-02-01 DIAGNOSIS — M791 Myalgia, unspecified site: Secondary | ICD-10-CM | POA: Diagnosis present

## 2019-02-01 DIAGNOSIS — G629 Polyneuropathy, unspecified: Secondary | ICD-10-CM | POA: Insufficient documentation

## 2019-02-01 DIAGNOSIS — J45909 Unspecified asthma, uncomplicated: Secondary | ICD-10-CM | POA: Diagnosis not present

## 2019-02-01 DIAGNOSIS — Z20828 Contact with and (suspected) exposure to other viral communicable diseases: Secondary | ICD-10-CM | POA: Diagnosis not present

## 2019-02-01 DIAGNOSIS — Z79899 Other long term (current) drug therapy: Secondary | ICD-10-CM | POA: Insufficient documentation

## 2019-02-01 NOTE — ED Triage Notes (Signed)
Pt c/o generalized body aches. Husband covid+. Pt denies chest pain/shortness of breath/fevers.

## 2019-02-02 ENCOUNTER — Encounter (HOSPITAL_COMMUNITY): Payer: Self-pay | Admitting: Emergency Medicine

## 2019-02-02 ENCOUNTER — Other Ambulatory Visit: Payer: Self-pay

## 2019-02-02 NOTE — Discharge Instructions (Signed)
You have a Covid test pending which should result in 24 to 48 hours.  You can also access your results using MyChart.  Continue use of Tylenol 1000 mg every 8 hours for pain control.  Follow-up with your primary care doctor.  You may return if symptoms persist or worsen.

## 2019-02-02 NOTE — ED Provider Notes (Signed)
[facility] Provider Note   CSN: [id number] Arrival date & time: 02/01/19  2324     History Chief Complaint  Patient presents with  . Generalized Body Aches    Lisa Perkins is a 53 y.o. female.   53 year old female presents to the emergency department for evaluation of extremity pain.  She began noticing discomfort in her hand approximately 2 days ago.  This has begun to radiate towards her forearm and up to her shoulder.  She states that she feels as though it is "hot inside" and sharp.  She has similar sensation in her bilateral feet making it uncomfortable for her to walk.  Subjectively feels as though her RUE and hand are more swollen.  Took 500 mg Tylenol for symptoms without relief.  No history of diabetes.  She denies any trauma or injury, neck pain, fevers.  Does have positive Covid exposure as husband tested positive on 01/19/2019.  She has been in quarantine and denies chest pain, shortness of breath, congestion, cough, vomiting or diarrhea.  The history is provided by the patient. No language interpreter was used.       Past Medical History:  Diagnosis Date  . Abnormal Pap smear of cervix    just a repeat done  . ALLERGIC RHINITIS 11/07/2007  . Asthma as child  . BENIGN NEOPLASM OF SKIN SITE UNSPECIFIED 07/25/2009  . DEPRESSION 11/07/2007  . Eczema   . GERD 11/07/2007   LPR - laryngopharyngeal reflux  . 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 Medication management 06/10/2017  . Anxiety 01/31/2017  . Bipolar disorder ([place]) 01/31/2017  . Depressive disorder 01/31/2017  . Iron deficiency anemia 05/11/2015  . Allergic sinusitis 06/05/2013  . Muscle spasms of neck trapezious 04/22/2012  . GERD (gastroesophageal reflux disease) 04/22/2012  . Drug interaction 04/22/2012  . BENIGN NEOPLASM OF SKIN SITE UNSPECIFIED 07/25/2009  . DEPRESSION 11/07/2007  . ALLERGIC RHINITIS 11/07/2007  .  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 GERD 11/07/2007    Past Surgical History:  Procedure Laterality Date  . TONSILLECTOMY AND ADENOIDECTOMY    . WISDOM TOOTH EXTRACTION       OB History    Gravida  0   Para  0   Term  0   Preterm  0   AB  0   Living  0     SAB  0   TAB  0   Ectopic  0   Multiple  0   Live Births  0           Family History  Problem Relation Age of Onset  . Endometriosis Mother   . Hypertension Mother   . Allergic rhinitis Mother   . Food Allergy Mother        shrimp, egg white  . Diabetes Maternal Grandmother   . Hypertension Maternal Grandmother   . Stroke Maternal Grandmother   . COPD Father   . Dementia Father   . Allergic rhinitis Father   . Asthma Father   . Hypertension Maternal Grandfather   . Stroke Maternal Grandfather   . Stroke Paternal Grandmother   . Stroke Paternal Grandfather   . Diabetes Maternal Uncle   . Breast cancer Paternal Aunt 14       bilateral mastectomy - survivor  . Angioedema Neg Hx   . Eczema Neg Hx   . Urticaria Neg Hx     Social  History   Tobacco Use  . Smoking status: Never Smoker  . Smokeless tobacco: Never Used  Substance Use Topics  . Alcohol use: Not Currently    Alcohol/week: 0.0 standard drinks  . Drug use: No    Home Medications Prior to Admission medications   Medication Sig Start Date End Date Taking? Authorizing Provider  albuterol (VENTOLIN HFA) 108 (90 Base) MCG/ACT inhaler Inhale 2 puffs into the lungs every 6 (six) hours as needed for wheezing or shortness of breath. 10/09/17   Panosh, Standley Brooking, MD  citalopram (CELEXA) 20 MG tablet Take 30 mg by mouth daily.    [provider]  clonazePAM (KLONOPIN) 0.5 MG tablet Take 0.5 mg by mouth at bedtime as needed.  12/21/10   [provider]  lamoTRIgine (LAMICTAL) 150 MG tablet Take 1 tablet by mouth daily. 10/29/13   [provider]  lansoprazole (PREVACID) 30 MG capsule  06/02/13   [provider]   olopatadine (PATANOL) 0.1 % ophthalmic solution PLACE 1 DROP INTO BOTH EYES 2 (TWO) TIMES DAILY. 07/02/16   Panosh, Standley Brooking, MD  Pediatric Multiple Vit-C-FA (FLINSTONES GUMMIES OMEGA-3 DHA PO) Take by mouth.    [provider]  polyethylene glycol (MIRALAX / GLYCOLAX) packet Take 17 g by mouth daily.    [provider]    Allergies    Amoxicillin and Erythromycin  Review of Systems   Review of Systems  Ten systems reviewed and are negative for acute change, except as noted in the HPI.    Physical Exam Updated Vital Signs BP 121/76 (BP Location: Left Arm)   Pulse 95   Temp 98.9 F (37.2 C) (Oral)   Resp 20   SpO2 96%   Physical Exam Vitals and nursing note reviewed.  Constitutional:      General: She is not in acute distress.    Appearance: She is well-developed. She is not diaphoretic.     Comments: Nontoxic appearing, pleasant.  HENT:     Head: Normocephalic and atraumatic.  Eyes:     General: No scleral icterus.    Conjunctiva/sclera: Conjunctivae normal.  Neck:     Comments: No tenderness to palpation of the cervical midline.  No bony deformities, step-offs, crepitus.  No nuchal rigidity or meningismus. Cardiovascular:     Rate and Rhythm: Normal rate and regular rhythm.     Pulses: Normal pulses.  Pulmonary:     Effort: Pulmonary effort is normal. No respiratory distress.     Comments: Respirations even and unlabored Musculoskeletal:        General: No deformity or signs of injury. Normal range of motion.     Cervical back: Normal range of motion.     Comments: No significant soft tissue swelling or edema to the RUE. Compartments are soft, compressible. No erythema, heat to touch, lymphangitic streaking. Full ROM of the RUE. Grip strength 5/5 in the R hand.  Skin:    General: Skin is warm and dry.     Coloration: Skin is not pale.     Findings: No erythema or rash.  Neurological:     Mental Status: She is alert and oriented to person, place, and  time.     Coordination: Coordination normal.     Comments: Ambulatory with steady gait.  Psychiatric:        Behavior: Behavior normal.     ED Results / Procedures / Treatments   Labs (all labs ordered are listed, but only abnormal results are displayed)  Labs Reviewed  NOVEL CORONAVIRUS, NAA (HOSP ORDER, SEND-OUT TO REF LAB; TAT 18-24 HRS)    EKG None  Radiology No results found.  Procedures Procedures (including critical care time)  Medications Ordered in ED Medications - No data to display  ED Course  I have reviewed the triage vital signs and the nursing notes.  Pertinent labs & imaging results that were available during my care of the patient were reviewed by me and considered in my medical decision making (see chart for details).    MDM Rules/Calculators/A&P   53 year old female presents to the emergency department for evaluation of extremity pain.  She is neurovascularly intact and ambulatory.  No evidence of secondary infection or cellulitis.  Her symptoms sound mostly consistent with a degree of neuropathy.  While she reports subjective soft tissue swelling, she has no significant edema on exam.  Compartments are soft.  No crepitus.  Reporting history of Covid exposure.  Symptoms may be secondary to viral illness.  An outpatient Covid test was completed and sent.  Patient to follow-up on results.  Encouraged home supportive measures.  Return precautions discussed and provided. Patient discharged in stable condition with no unaddressed concerns.  Lisa Perkins was evaluated in Emergency Department on 02/02/2019 for the symptoms described in the history of present illness. She was evaluated in the context of the global COVID-19 pandemic, which necessitated consideration that the patient might be at risk for infection with the SARS-CoV-2 virus that causes COVID-19. Institutional protocols and algorithms that pertain to the evaluation of patients at risk for COVID-19 are  in a state of rapid change based on information released by regulatory bodies including the CDC and federal and state organizations. These policies and algorithms were followed during the patient's care in the ED.   Final Clinical Impression(s) / ED Diagnoses Final diagnoses:  Neuropathy    Rx / DC Orders ED Discharge Orders    None       Antonietta Breach, PA-C 02/02/19 [place], [place], DO 02/02/19 0231

## 2019-02-03 LAB — NOVEL CORONAVIRUS, NAA (HOSP ORDER, SEND-OUT TO REF LAB; TAT 18-24 HRS): SARS-CoV-2, NAA: NOT DETECTED

## 2019-02-03 IMAGING — US US RENAL
1 series · 14 of 25 positions shown · non-contrast
Comparison: None.

CLINICAL DATA: Initial evaluation for elevated creatinine.

EXAM:
RENAL / URINARY TRACT ULTRASOUND COMPLETE

[Series 1: us renal · 0.20mm/px · 14 of 41 slices shown]
[im 1/41]
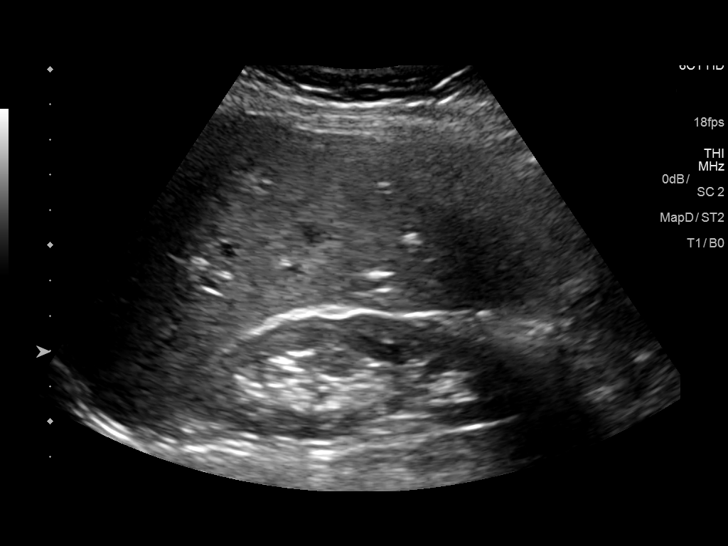
[im 4/41]
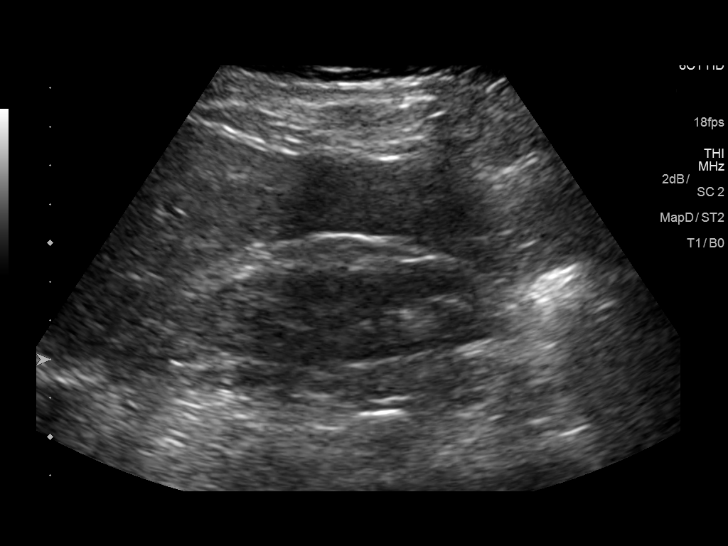
[im 7/41]
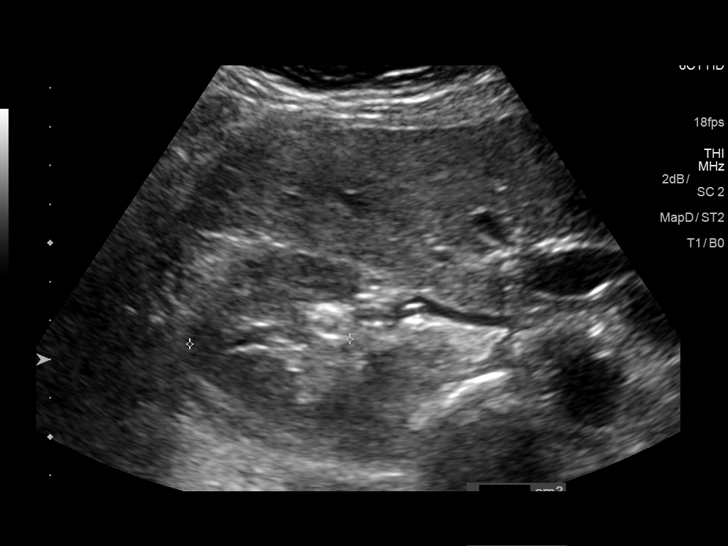
[im 11/41]
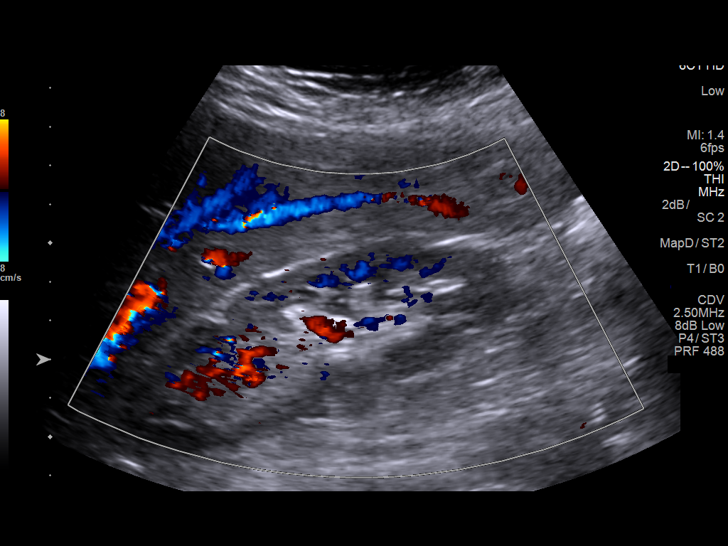
[im 14/41]
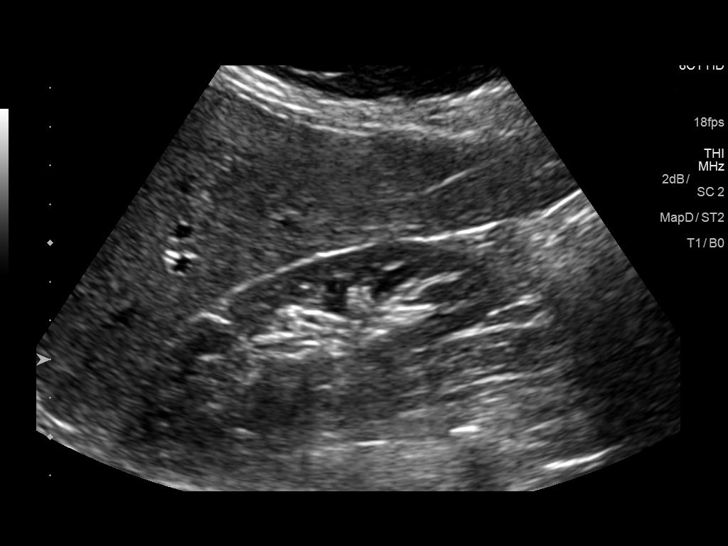
[im 16/41]
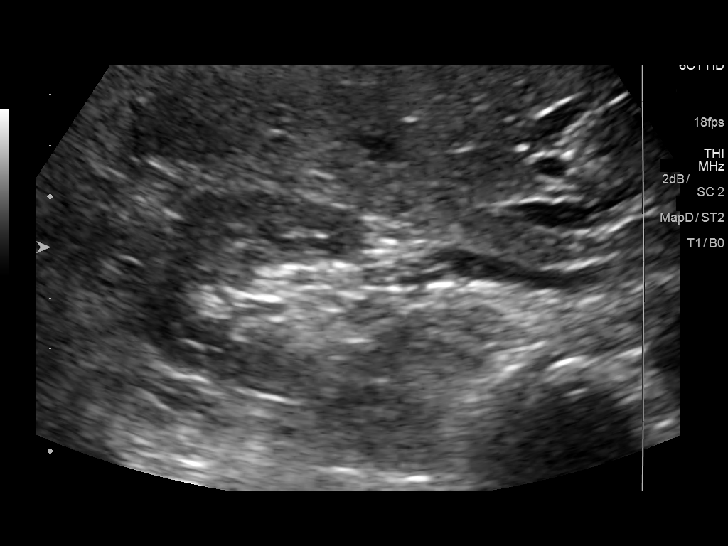
[im 19/41]
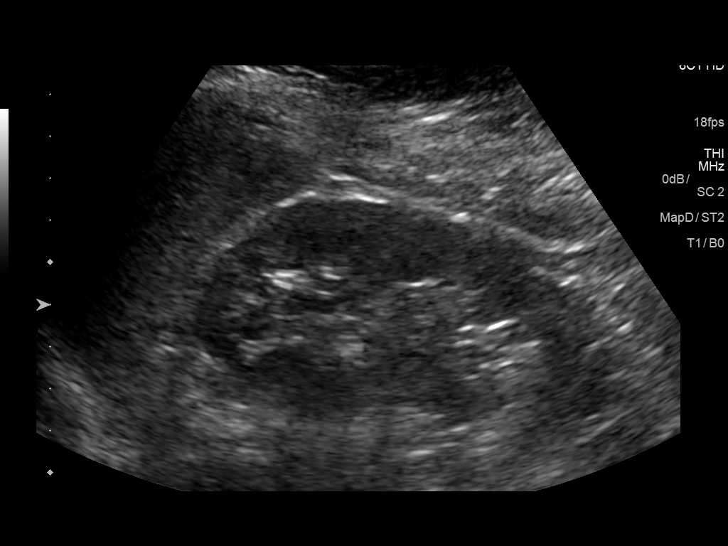
[im 22/41]
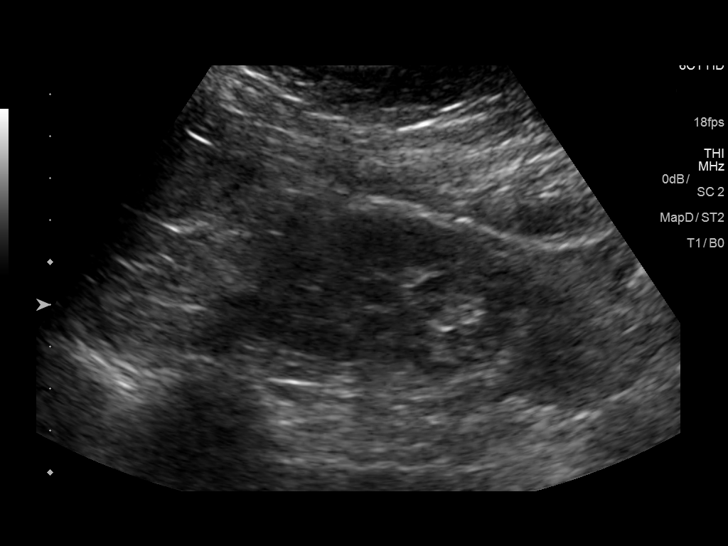
[im 26/41]
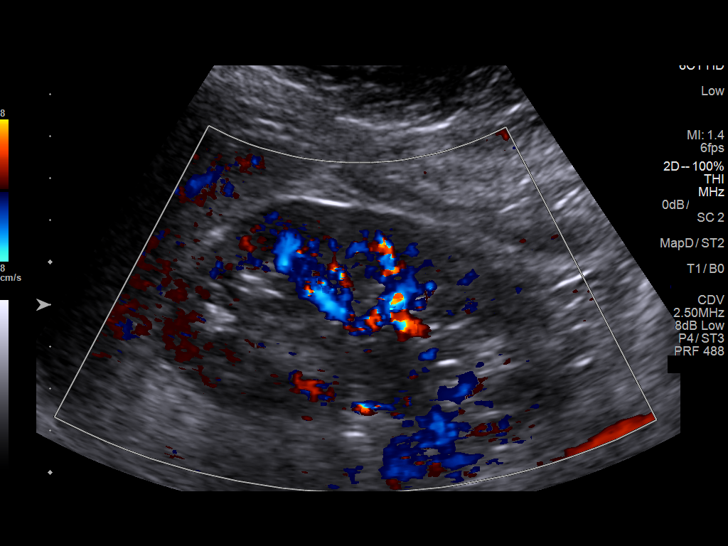
[im 27/41]
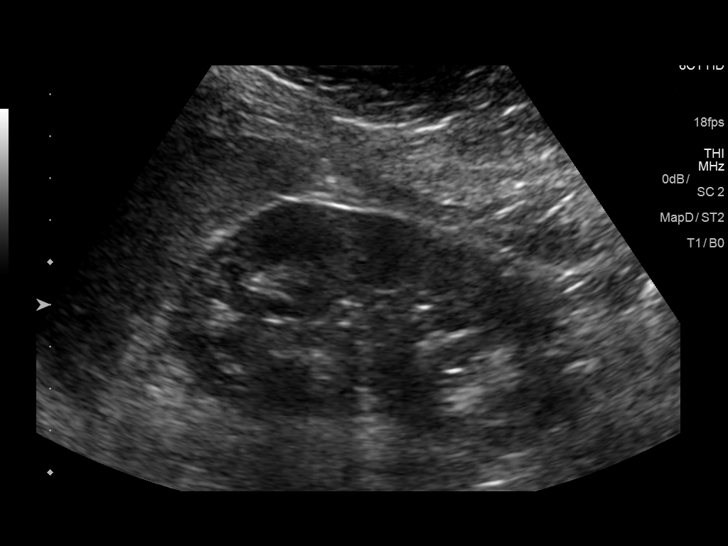
[im 31/41]
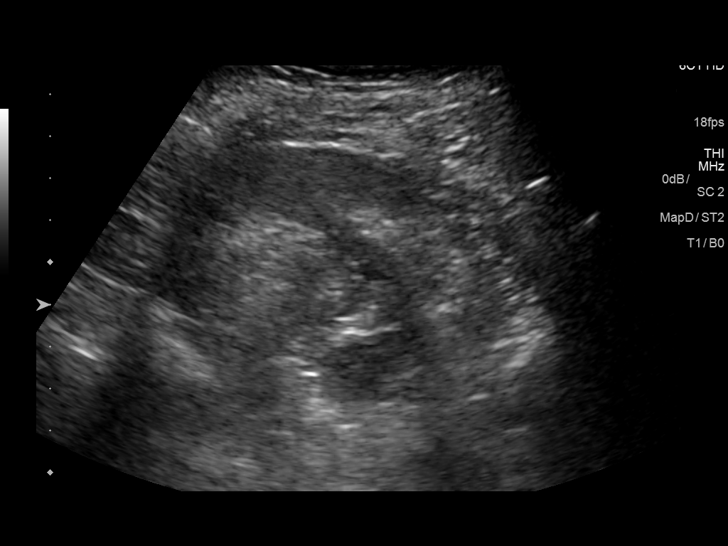
[im 34/41]
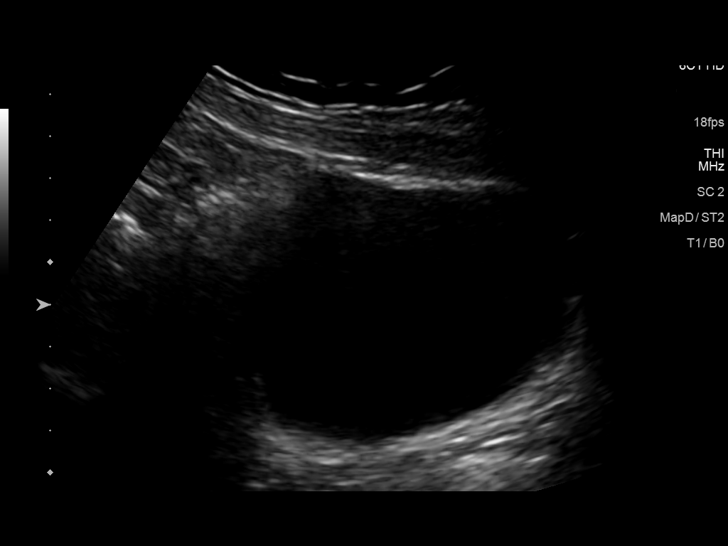
[im 37/41]
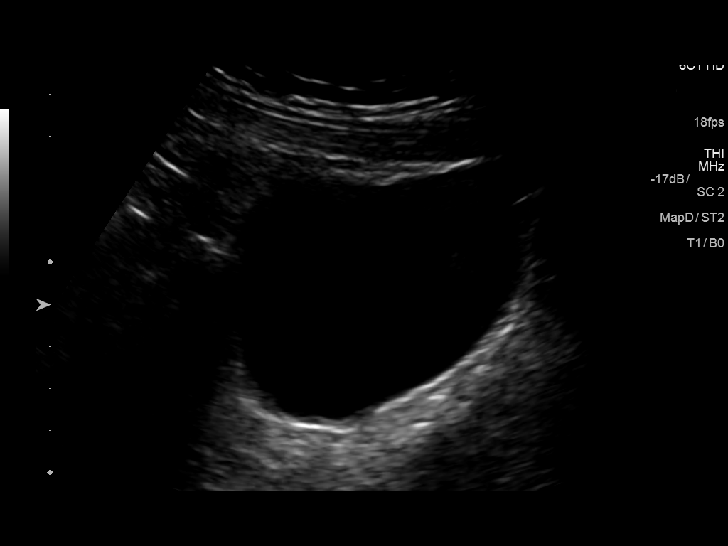
[im 41/41]
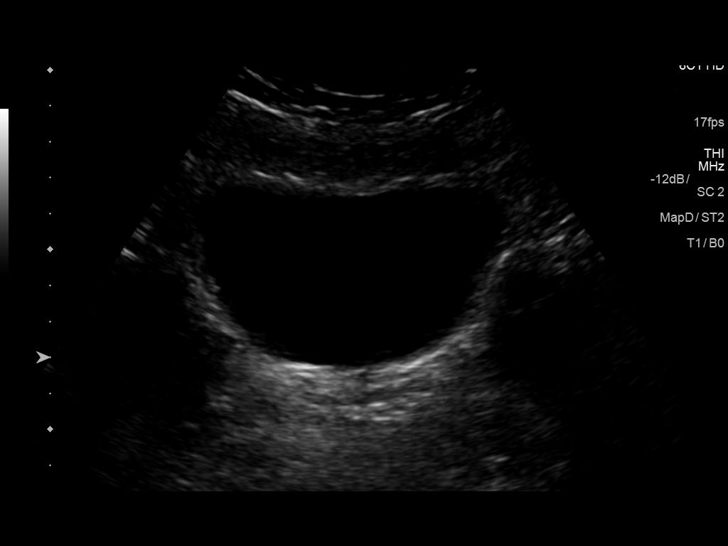

[14 of 25 positions shown; findings below may reference images not displayed]

FINDINGS: Right Kidney:

Renal measurements: 10.0 x 3.6 x 4.1 cm = volume: 76.7 mL. Mildly
increased echogenicity within the renal parenchyma, suggesting
medical renal disease. No mass or hydronephrosis visualized.

Left Kidney:

Renal measurements: 10.4 x 5.0 x 4.8 cm = volume: 129.3 mL. Mildly
increased echogenicity within the renal parenchyma, suggesting
medical renal disease. No mass or hydronephrosis visualized.

Bladder:

Appears normal for degree of bladder distention.
IMPRESSION: 1. Mildly increased echogenicity within the renal parenchyma,
suggesting medical renal disease.
2. No hydronephrosis.

## 2019-02-05 ENCOUNTER — Ambulatory Visit: Payer: BLUE CROSS/BLUE SHIELD | Admitting: Obstetrics and Gynecology

## 2019-04-21 ENCOUNTER — Telehealth: Payer: Self-pay | Admitting: Internal Medicine

## 2019-04-21 DIAGNOSIS — N289 Disorder of kidney and ureter, unspecified: Secondary | ICD-10-CM

## 2019-04-21 NOTE — Telephone Encounter (Signed)
Pt is requesting a referral to [facility] nephrology. With referring to them they require patient demographics, ultrasound results, and lab results from last two visits.    [facility] Fax: [phone]

## 2019-04-21 NOTE — Telephone Encounter (Signed)
Please advise if okay to do  referral

## 2019-04-23 NOTE — Telephone Encounter (Signed)
Ok to do referral  ( she had a local nephrology consult in past )

## 2019-04-23 NOTE — Telephone Encounter (Signed)
Left voicemail letting pt know reerral has been placed

## 2019-04-27 NOTE — Telephone Encounter (Signed)
Pt said the she provided the wrong fax number for [facility].  Correct Fax: [phone]

## 2019-04-28 NOTE — Telephone Encounter (Signed)
fyi

## 2019-05-11 ENCOUNTER — Encounter: Payer: Self-pay | Admitting: Certified Nurse Midwife

## 2019-05-15 NOTE — Telephone Encounter (Signed)
Faxed referral and notes x4 I did not get a conformation that fax went through always busy. I  Faxed to  [phone]  Called pt she said fax was corrected  Will keep trying  To fax notes / referral

## 2019-06-10 ENCOUNTER — Other Ambulatory Visit: Payer: Self-pay

## 2019-06-10 DIAGNOSIS — M4120 Other idiopathic scoliosis, site unspecified: Secondary | ICD-10-CM

## 2019-06-10 DIAGNOSIS — M549 Dorsalgia, unspecified: Secondary | ICD-10-CM

## 2019-06-10 NOTE — Telephone Encounter (Signed)
Ok to refer based on diagnosis    She may  need  Any the imaging  studies  Done Sent to  to get appt  There are no recent imaging studies in the EHR

## 2019-10-02 ENCOUNTER — Other Ambulatory Visit: Payer: Self-pay

## 2019-10-02 DIAGNOSIS — L600 Ingrowing nail: Secondary | ICD-10-CM

## 2019-10-05 ENCOUNTER — Ambulatory Visit: Payer: No Typology Code available for payment source | Admitting: Internal Medicine

## 2019-10-12 ENCOUNTER — Encounter: Payer: Self-pay | Admitting: Podiatry

## 2019-10-12 ENCOUNTER — Other Ambulatory Visit: Payer: Self-pay

## 2019-10-12 ENCOUNTER — Ambulatory Visit (INDEPENDENT_AMBULATORY_CARE_PROVIDER_SITE_OTHER): Payer: No Typology Code available for payment source | Admitting: Podiatry

## 2019-10-12 VITALS — BP 109/71 | HR 93 | Temp 97.3°F | Resp 16

## 2019-10-12 DIAGNOSIS — L600 Ingrowing nail: Secondary | ICD-10-CM | POA: Diagnosis not present

## 2019-10-12 MED ORDER — NEOMYCIN-POLYMYXIN-HC 3.5-10000-1 OT SOLN: OTIC | 0 refills | Status: AC

## 2019-10-12 NOTE — Progress Notes (Signed)
Subjective:   Patient ID: Lisa Perkins, female   DOB: 54 y.o.   MRN: [id number]   HPI Patient presents stating she has ingrown toenails on both big toes and she had her right 1 worked on at the hospital several weeks ago and it still been giving her trouble.  States she is had a number of month history of this and it is increasingly hard for her to wear shoe gear comfortably and she is tried soaks and trimming without relief.  Patient does not smoke likes to be active   Review of Systems  All other systems reviewed and are negative.       Objective:  Physical Exam Vitals and nursing note reviewed.  Constitutional:      Appearance: She is well-developed.  Pulmonary:     Effort: Pulmonary effort is normal.  Musculoskeletal:        General: Normal range of motion.  Skin:    General: Skin is warm.  Neurological:     Mental Status: She is alert.     Neurovascular status found to be intact muscle strength found to be adequate range of motion within normal limits.  Patient is found to have incurvated hallux nail borders hallux bilateral both medial and lateral borders with the medial right having been worked on several weeks ago with localized irritation of tissue but no proximal edema erythema or drainage noted.  Patient is found to have good digital perfusion well oriented x3     Assessment:  Ingrown toenail deformity of the hallux bilateral medial lateral borders with pain with paronychia excision right medial border that was not completely done     Plan:  H&P reviewed conditions and explained to patient causes.  At this point I recommended removal of the nail borders permanently and she wants this done and I allowed her to read and then signed consent form reviewing procedures.  I anesthetized each hallux with 60 mg like Marcaine mixture sterile prep applied to each big toe and using sterile instrumentation I remove the medial lateral borders exposed matrix and applied phenol 3  applications 30 seconds followed by alcohol lavage and sterile dressing.  Gave instructions on soaks and to leave dressings on 24 hours but take them off earlier if any drainage were to occur or throbbing and wrote for drops.  Encouraged her to call with questions or concerns

## 2019-10-12 NOTE — Patient Instructions (Signed)

## 2019-10-19 ENCOUNTER — Other Ambulatory Visit: Payer: No Typology Code available for payment source

## 2020-01-19 ENCOUNTER — Encounter: Payer: No Typology Code available for payment source | Admitting: Internal Medicine

## 2020-02-08 ENCOUNTER — Other Ambulatory Visit: Payer: Self-pay

## 2020-02-08 ENCOUNTER — Ambulatory Visit (INDEPENDENT_AMBULATORY_CARE_PROVIDER_SITE_OTHER): Payer: No Typology Code available for payment source | Admitting: Podiatry

## 2020-02-08 ENCOUNTER — Encounter: Payer: Self-pay | Admitting: Podiatry

## 2020-02-08 DIAGNOSIS — L600 Ingrowing nail: Secondary | ICD-10-CM | POA: Diagnosis not present

## 2020-02-08 NOTE — Progress Notes (Signed)
Subjective:   Patient ID: Lisa Perkins, female   DOB: 54 y.o.   MRN: [id number]   HPI Patient presents concerned about crusted tissue on her hallux right over left and whether or not she has ingrown's back again or other issue from procedure done 4 months ago   ROS      Objective:  Physical Exam  Neurovascular status intact with crusted tissue more scab-like in the right hallux medial border and slightly and other borders     Assessment:  Probability that this is scab tissue versus ingrown reoccurrence     Plan:  H&P educated patient debrided out the tissue begin soaks with an old toothbrush to keep the tissue down and reappoint if symptoms persist

## 2021-05-01 ENCOUNTER — Other Ambulatory Visit: Payer: Self-pay

## 2021-05-01 ENCOUNTER — Ambulatory Visit
Admission: EM | Admit: 2021-05-01 | Discharge: 2021-05-01 | Disposition: A | Discharge status: Home / Self Care | Payer: BC Managed Care – PPO | Attending: Physician Assistant | Admitting: Physician Assistant

## 2021-05-01 ENCOUNTER — Telehealth: Payer: Self-pay

## 2021-05-01 DIAGNOSIS — N760 Acute vaginitis: Secondary | ICD-10-CM | POA: Insufficient documentation

## 2021-05-01 DIAGNOSIS — B3731 Acute candidiasis of vulva and vagina: Secondary | ICD-10-CM | POA: Insufficient documentation

## 2021-05-01 DIAGNOSIS — B029 Zoster without complications: Secondary | ICD-10-CM | POA: Insufficient documentation

## 2021-05-01 DIAGNOSIS — B9689 Other specified bacterial agents as the cause of diseases classified elsewhere: Secondary | ICD-10-CM | POA: Insufficient documentation

## 2021-05-01 LAB — URINALYSIS, MICROSCOPIC (REFLEX)

## 2021-05-01 LAB — WET PREP, GENITAL
Sperm: NONE SEEN
Trich, Wet Prep: NONE SEEN
WBC, Wet Prep HPF POC: >10 — AB (ref <10)

## 2021-05-01 LAB — URINALYSIS, ROUTINE W REFLEX MICROSCOPIC
Bilirubin Urine: NEGATIVE
Glucose, UA: NEGATIVE mg/dL
Ketones, ur: NEGATIVE mg/dL
Nitrite: NEGATIVE
Protein, ur: NEGATIVE mg/dL
Specific Gravity, Urine: 1.01 (ref 1.005–1.030)
pH: 7 (ref 5.0–8.0)

## 2021-05-01 MED ORDER — METRONIDAZOLE 500 MG PO TABS: 500.0000 mg | ORAL_TABLET | Freq: Two times a day (BID) | ORAL | 0 refills | Status: DC

## 2021-05-01 MED ORDER — FLUCONAZOLE 150 MG PO TABS: 150.0000 mg | ORAL_TABLET | Freq: Every day | ORAL | 0 refills | Status: AC

## 2021-05-01 MED ORDER — VALACYCLOVIR HCL 1 G PO TABS: 1000.0000 mg | ORAL_TABLET | Freq: Three times a day (TID) | ORAL | 0 refills | Status: AC

## 2021-05-01 NOTE — ED Triage Notes (Signed)
Pt with 4 days of vaginal itching and urinary frequency starting yesterday.  ?

## 2021-05-01 NOTE — Telephone Encounter (Signed)
Pt stepped out into lobby after being discharged and then provided a UA ?

## 2021-05-01 NOTE — Discharge Instructions (Addendum)
Your vaginal swab was positive for bacterial vaginosis and yeast, negative for trichomoniasis ? ?Begin use of metronidazole twice daily for the next 7 days to read bacteria ? ?Take Diflucan on day 1 then take second dose after completion of antibiotic ? ?You may return a urine sample prior to 6:30 PM today or tomorrow for evaluation for urinary infection, you will be notified of any concerning results ? ?If the area on your wrist becomes a blisterlike and becomes more painful you may begin use of valacyclovir taking 3 times daily for 7 days for management of shingles ? ?More information has been placed in your packet regarding the shingles infection ?

## 2021-05-01 NOTE — ED Provider Notes (Signed)
[facility]    CSN: [id number] Arrival date & time: 05/01/21  1553      History   Chief Complaint Chief Complaint  Patient presents with   Vaginal Itching   Urinary Urgency    HPI Lisa Perkins is a 56 y.o. female.   Presents with urinary frequency, urgency, dysuria and vaginal itching for 1 day.  Has not attempted treatment of symptoms.  Denies hematuria, abdominal pain or pressure, flank pain, fever, chills, vaginal discharge or odor.  Patient concerned with 3 reddened areas to the right wrist, mildly painful and pruritic.  Pain is described as a burning sensation.  Patient's husband currently has shingles.  Not vaccinated.  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    Patient Active Problem List   Diagnosis Date Noted   Medication management 06/10/2017   Anxiety 01/31/2017   Bipolar disorder ([place]) 01/31/2017   Depressive disorder 01/31/2017   Iron deficiency anemia 05/11/2015   Allergic sinusitis 06/05/2013   Muscle spasms of neck trapezious 04/22/2012   GERD (gastroesophageal reflux disease) 04/22/2012   Drug interaction 04/22/2012   BENIGN NEOPLASM OF SKIN SITE UNSPECIFIED 07/25/2009   DEPRESSION 11/07/2007   ALLERGIC RHINITIS 11/07/2007   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  Authorizing Provider  fluconazole (DIFLUCAN) 150 MG tablet Take 1 tablet (150 mg total) by mouth daily for 2 doses. 05/01/21 05/03/21 Yes Kristapher [place] R, NP  metroNIDAZOLE (FLAGYL) 500 MG tablet Take 1 tablet (500 mg total) by mouth 2 (two) times daily. 05/01/21  Yes Malayjah Otoole, Leitha Schuller, NP  valACYclovir (VALTREX) 1000 MG tablet Take 1 tablet (1,000 mg total) by mouth 3 (three) times daily for 7 days. 05/01/21 05/08/21 Yes Fiona Coto R, NP  busPIRone (BUSPAR) 10 MG tablet buspirone 10 mg tablet    [provider]  citalopram (CELEXA) 20 MG tablet Take 30 mg by mouth daily.    [provider]  clonazePAM (KLONOPIN) 0.5 MG tablet Take 0.5 mg by mouth at bedtime as needed.  12/21/10   [provider]  lamoTRIgine (LAMICTAL) 150 MG tablet Take 1 tablet by mouth daily. 10/29/13   [provider]  lansoprazole (PREVACID) 30 MG capsule  06/02/13   [provider]  neomycin-polymyxin-hydrocortisone (CORTISPORIN) OTIC solution Apply 1-2 drops to toe after soaking twice a day 10/12/19   Wallene Huh, DPM  Pediatric Multiple Vit-C-FA (FLINSTONES GUMMIES OMEGA-3 DHA PO) Take by mouth.    [provider]  Family History Family History  Problem Relation Age of Onset   Endometriosis Mother    Hypertension Mother    Allergic rhinitis Mother    Food Allergy Mother        shrimp, egg Ronneisha Jett   COPD Father    Dementia Father    Allergic rhinitis Father    Asthma Father    Diabetes Maternal Grandmother    Hypertension Maternal Grandmother    Stroke Maternal Grandmother    Hypertension Maternal Grandfather    Stroke Maternal Grandfather    Stroke Paternal Grandmother    Stroke Paternal Grandfather    Diabetes Maternal Uncle    Breast cancer Paternal Aunt 44       bilateral mastectomy - survivor   Angioedema Neg Hx    Eczema Neg Hx    Urticaria Neg Hx     Social History Social History   Tobacco Use   Smoking status: Never   Smokeless  tobacco: Never  Vaping Use   Vaping Use: Never used  Substance Use Topics   Alcohol use: Yes    Comment: rare   Drug use: No     Allergies   Amoxicillin, Erythromycin, and Tape   Review of Systems Review of Systems  Constitutional: Negative.   Respiratory: Negative.    Cardiovascular: Negative.   Genitourinary:  Positive for dysuria, frequency and urgency. Negative for decreased urine volume, difficulty urinating, dyspareunia, enuresis, flank pain, genital sores, hematuria, menstrual problem, pelvic pain, vaginal bleeding, vaginal discharge and vaginal pain.  Musculoskeletal: Negative.     Physical Exam Triage Vital Signs ED Triage Vitals  Enc Vitals Group     BP 05/01/21 1617 134/76     Pulse Rate 05/01/21 1617 77     Resp 05/01/21 1617 16     Temp 05/01/21 1617 98.4 F (36.9 C)     Temp Source 05/01/21 1617 Oral     SpO2 05/01/21 1617 99 %     Weight 05/01/21 1611 163 lb (73.9 kg)     Height 05/01/21 1611 5' 6.5" (1.689 m)     Head Circumference --      Peak Flow --      Pain Score 05/01/21 1611 0     Pain Loc --      Pain Edu? --      Excl. in [place]? --    No data found.  Updated Vital Signs BP 134/76 (BP Location: Left Arm)    Pulse 77    Temp 98.4 F (36.9 C) (Oral)    Resp 16    Ht 5' 6.5" (1.689 m)    Wt 163 lb (73.9 kg)    LMP 09/02/2017 (Exact Date)    SpO2 99%    BMI 25.91 kg/m   Visual Acuity Right Eye Distance:   Left Eye Distance:   Bilateral Distance:    Right Eye Near:   Left Eye Near:    Bilateral Near:     Physical Exam Constitutional:      Appearance: Normal appearance.  Eyes:     Extraocular Movements: Extraocular movements intact.  Pulmonary:     Effort: Pulmonary effort is normal.  Abdominal:     General: Abdomen is flat. Bowel sounds are normal.     Palpations: Abdomen is soft.     Tenderness: There is no abdominal tenderness.  Skin:    Comments: 3 less than 0.5 cm erythematous macules present to the right wrist  Neurological:  Mental Status: She is alert and oriented to person, place, and time. Mental status is at baseline.  Psychiatric:        Mood and Affect: Mood normal.        Behavior: Behavior normal.     UC Treatments / Results  Labs (all labs ordered are listed, but only abnormal results are displayed) Labs Reviewed  WET PREP, GENITAL - Abnormal; Notable for the following components:      Result Value   Yeast Wet Prep HPF POC PRESENT (*)    Clue Cells Wet Prep HPF POC PRESENT (*)    WBC, Wet Prep HPF POC >10 (*)    All other components within normal limits    EKG   Radiology No results found.  Procedures Procedures (including critical care time)  Medications Ordered in UC Medications - No data to display  Initial Impression / Assessment and Plan / UC Course  I have reviewed the triage vital signs and the nursing notes.  Pertinent labs & imaging results that were available during my care of the patient were reviewed by me and considered in my medical decision making (see chart for details).  Bacterial vaginosis Vaginal yeast infection Herpes zoster without complication  Vaginal swab positive for bacterial vaginosis and yeast, negative for trichomoniasis, unable to provide urine sample while in office, sent home with kit, metronidazole 7-day course and Diflucan prescribed for infections, advise discontinuation of alcohol use while metronidazole, advised good feminine hygiene such as increasing water intake, wiping from front to back, urination after sexual encounters and avoidance of scented products, may follow-up with urgent care or primary care doctor for persistent symptoms, Prescribed valacyclovir 7-day course for management of shingles Final Clinical Impressions(s) / UC Diagnoses   Final diagnoses:  Bacterial vaginosis  Vaginal yeast infection  Herpes zoster without complication     Discharge Instructions      Your vaginal swab was positive for bacterial vaginosis and  yeast, negative for trichomoniasis  Begin use of metronidazole twice daily for the next 7 days to read bacteria  Take Diflucan on day 1 then take second dose after completion of antibiotic  You may return a urine sample prior to 6:30 PM today or tomorrow for evaluation for urinary infection, you will be notified of any concerning results  If the area on your wrist becomes a blisterlike and becomes more painful you may begin use of valacyclovir taking 3 times daily for 7 days for management of shingles  More information has been placed in your packet regarding the shingles infection   ED Prescriptions     Medication Sig Dispense Auth. Provider   metroNIDAZOLE (FLAGYL) 500 MG tablet Take 1 tablet (500 mg total) by mouth 2 (two) times daily. 14 tablet Stonewall Doss R, NP   fluconazole (DIFLUCAN) 150 MG tablet Take 1 tablet (150 mg total) by mouth daily for 2 doses. 2 tablet Alaric Gladwin, Vincente Liberty R, NP   valACYclovir (VALTREX) 1000 MG tablet Take 1 tablet (1,000 mg total) by mouth 3 (three) times daily for 7 days. 21 tablet Ethie Curless, Leitha Schuller, NP      PDMP not reviewed this encounter.   Hans Eden, NP 05/01/21 1721

## 2021-07-22 ENCOUNTER — Telehealth: Payer: BC Managed Care – PPO | Admitting: Physician Assistant

## 2021-07-22 DIAGNOSIS — R21 Rash and other nonspecific skin eruption: Secondary | ICD-10-CM | POA: Diagnosis not present

## 2021-07-22 NOTE — Progress Notes (Signed)
Virtual Visit Consent   Lisa Perkins, you are scheduled for a virtual visit with a [facility] provider today. Just as with appointments in the office, your consent must be obtained to participate. Your consent will be active for this visit and any virtual visit you may have with one of our providers in the next 365 days. If you have a MyChart account, a copy of this consent can be sent to you electronically.  As this is a virtual visit, video technology does not allow for your provider to perform a traditional examination. This may limit your provider's ability to fully assess your condition. If your provider identifies any concerns that need to be evaluated in person or the need to arrange testing (such as labs, EKG, etc.), we will make arrangements to do so. Although advances in technology are sophisticated, we cannot ensure that it will always work on either your end or our end. If the connection with a video visit is poor, the visit may have to be switched to a telephone visit. With either a video or telephone visit, we are not always able to ensure that we have a secure connection.  By engaging in this virtual visit, you consent to the provision of healthcare and authorize for your insurance to be billed (if applicable) for the services provided during this visit. Depending on your insurance coverage, you may receive a charge related to this service.  I need to obtain your verbal consent now. Are you willing to proceed with your visit today? Lisa Perkins has provided verbal consent on 07/22/2021 for a virtual visit (video or telephone). Inda Coke, [place]  Date: 07/22/2021 1:10 PM  Virtual Visit via Video Note   I, Inda Coke, connected with  Lisa Perkins  ([id number], 1966/02/13) on 07/22/21 at  1:00 PM EDT by a video-enabled telemedicine application and verified that I am speaking with the correct person using two identifiers.  Location: Patient: Virtual Visit Location Patient:  Home Provider: Virtual Visit Location Provider: Home Office   I discussed the limitations of evaluation and management by telemedicine and the availability of in person appointments. The patient expressed understanding and agreed to proceed.    History of Present Illness: Lisa Perkins is a 56 y.o. who identifies as a female who was assigned female at birth, and is being seen today for rash.  Patient was walking her dog about an hour ago and had a tick on her thigh. She quickly brushed it off. It was not attached. She now has a red line on her skin and is concerned.  Denies: fever, chills, malaise, bleeding/discharge from area, hx of tick-borne illness, immunocompromised status, neck stiffness, fatigue  HPI: HPI  Problems:  Patient Active Problem List   Diagnosis Date Noted   Medication management 06/10/2017   Anxiety 01/31/2017   Bipolar disorder ([place]) 01/31/2017   Depressive disorder 01/31/2017   Iron deficiency anemia 05/11/2015   Allergic sinusitis 06/05/2013   Muscle spasms of neck trapezious 04/22/2012   GERD (gastroesophageal reflux disease) 04/22/2012   Drug interaction 04/22/2012   BENIGN NEOPLASM OF SKIN SITE UNSPECIFIED 07/25/2009   DEPRESSION 11/07/2007   ALLERGIC RHINITIS 11/07/2007   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 , Disp: , Rfl:    lamoTRIgine (LAMICTAL) 150 MG tablet, Take 1 tablet by mouth daily., Disp: , Rfl:    lansoprazole (PREVACID) 30 MG capsule, , Disp: , Rfl:    neomycin-polymyxin-hydrocortisone (CORTISPORIN) OTIC solution, Apply 1-2 drops to toe  after soaking twice a day, Disp: 10 mL, Rfl: 0   Pediatric Multiple Vit-C-FA (FLINSTONES GUMMIES OMEGA-3 DHA PO), Take by mouth., Disp: , Rfl:   Observations/Objective: Patient is well-developed, well-nourished in no acute distress.  Resting comfortably  at home.  Head is normocephalic, atraumatic.  No labored breathing.  Speech is clear and coherent with logical content.  Patient is alert and oriented at baseline.  Erythematous linear rash to L anterior thigh without any obvious discharge or area of broken skin  Assessment and Plan: 1. Rash No red flags Suspect dermatitis from tick being on her skin-- does not appear infected Recommend topical hydrocortisone ointment and daily antihistamine of choice If worsening or new sx, needs in office eval  Follow Up Instructions: I discussed the assessment and treatment plan with the patient. The patient was provided an opportunity to ask questions and all were answered. The patient agreed with the plan and demonstrated an understanding of the instructions.  A copy of instructions were sent to the patient via MyChart unless otherwise noted below.   The patient was advised to call back or seek an in-person evaluation if the symptoms worsen or if the condition fails to improve as anticipated.  Time:  I spent 5-10 minutes with the patient via telehealth technology discussing the above problems/concerns.    Inda Coke, [place]

## 2021-09-18 ENCOUNTER — Ambulatory Visit: Payer: BC Managed Care – PPO | Admitting: Podiatry

## 2021-11-24 ENCOUNTER — Ambulatory Visit: Payer: BC Managed Care – PPO | Admitting: Podiatry

## 2022-03-25 ENCOUNTER — Encounter: Payer: Self-pay | Admitting: Emergency Medicine

## 2022-03-25 ENCOUNTER — Ambulatory Visit
Admission: EM | Admit: 2022-03-25 | Discharge: 2022-03-25 | Disposition: A | Discharge status: Home / Self Care | Payer: No Typology Code available for payment source | Attending: Emergency Medicine | Admitting: Emergency Medicine

## 2022-03-25 DIAGNOSIS — B349 Viral infection, unspecified: Secondary | ICD-10-CM | POA: Diagnosis not present

## 2022-03-25 DIAGNOSIS — H9201 Otalgia, right ear: Secondary | ICD-10-CM | POA: Diagnosis not present

## 2022-03-25 MED ORDER — IPRATROPIUM BROMIDE 0.03 % NA SOLN: 2.0000 | Freq: Two times a day (BID) | NASAL | 12 refills | Status: AC

## 2022-03-25 NOTE — ED Provider Notes (Signed)
[facility]    CSN: [id number] Arrival date & time: 03/25/22  1033      History   Chief Complaint Chief Complaint  Patient presents with   Otalgia    right    HPI Lisa Perkins is a 57 y.o. female.   Patient presents for evaluation of right ear pain, fullness and pruritus beginning 1 day ago.  Currently has a viral infection with nasal congestion.  Has been taking Tylenol Sinus max for management.  Denies fevers.  Tolerating food and liquids.    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    Patient Active Problem List   Diagnosis Date Noted   Medication management 06/10/2017   Anxiety 01/31/2017   Bipolar disorder ([place]) 01/31/2017   Depressive disorder 01/31/2017   Iron deficiency anemia 05/11/2015   Allergic sinusitis 06/05/2013   Muscle spasms of neck trapezious 04/22/2012   GERD (gastroesophageal reflux disease) 04/22/2012   Drug interaction 04/22/2012   BENIGN NEOPLASM OF SKIN SITE UNSPECIFIED 07/25/2009   DEPRESSION 11/07/2007   ALLERGIC RHINITIS 11/07/2007   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 Authorizing Provider  busPIRone (BUSPAR) 10 MG tablet buspirone 10 mg tablet   Yes [provider]  citalopram (CELEXA) 20 MG tablet Take 30 mg by mouth daily.   Yes [provider]  clonazePAM (KLONOPIN) 0.5 MG tablet Take 0.5 mg by mouth at bedtime as needed.  12/21/10  Yes [provider]  lamoTRIgine (LAMICTAL) 150 MG tablet Take 1 tablet by mouth daily. 10/29/13  Yes [provider]  lansoprazole (PREVACID) 30 MG capsule  06/02/13  Yes [provider]  neomycin-polymyxin-hydrocortisone (CORTISPORIN) OTIC solution Apply 1-2 drops to toe after soaking twice a day 10/12/19   Wallene Huh, DPM  Pediatric Multiple Vit-C-FA (FLINSTONES GUMMIES OMEGA-3 DHA PO) Take by mouth.    [provider]    Family History Family History  Problem Relation Age of Onset   Endometriosis Mother    Hypertension Mother    Allergic rhinitis Mother    Food Allergy Mother        shrimp, egg Anyia Gierke   COPD Father    Dementia Father    Allergic rhinitis Father    Asthma Father    Diabetes Maternal Grandmother    Hypertension Maternal Grandmother    Stroke Maternal Grandmother    Hypertension Maternal Grandfather    Stroke Maternal Grandfather    Stroke Paternal Grandmother  Stroke Paternal Grandfather    Diabetes Maternal Uncle    Breast cancer Paternal Aunt 12       bilateral mastectomy - survivor   Angioedema Neg Hx    Eczema Neg Hx    Urticaria Neg Hx     Social History Social History   Tobacco Use   Smoking status: Never   Smokeless tobacco: Never  Vaping Use   Vaping Use: Never used  Substance Use Topics   Alcohol use: Yes    Comment: rare   Drug use: No     Allergies   Amoxicillin, Erythromycin, and Tape   Review of Systems Review of Systems  Constitutional: Negative.   HENT:  Positive for ear pain. Negative for congestion, dental problem, drooling, ear discharge, facial swelling, hearing loss, mouth sores, nosebleeds, postnasal drip, rhinorrhea, sinus pressure, sinus pain, sneezing, sore throat, tinnitus, trouble swallowing and voice change.   Respiratory: Negative.       Physical Exam Triage Vital  Signs ED Triage Vitals  Enc Vitals Group     BP 03/25/22 1043 111/73     Pulse Rate 03/25/22 1043 94     Resp 03/25/22 1043 14     Temp 03/25/22 1043 97.8 F (36.6 C)     Temp Source 03/25/22 1043 Oral     SpO2 03/25/22 1043 96 %     Weight 03/25/22 1041 164 lb (74.4 kg)     Height 03/25/22 1041 5' 6.5" (1.689 m)     Head Circumference --      Peak Flow --      Pain Score 03/25/22 1041 2     Pain Loc --      Pain Edu? --      Excl. in [place]? --    No data found.  Updated Vital Signs BP 111/73 (BP Location: Left Arm)   Pulse 94   Temp 97.8 F (36.6 C) (Oral)   Resp 14   Ht 5' 6.5" (1.689 m)   Wt 164 lb (74.4 kg)   LMP 09/02/2017 (Exact Date)   SpO2 96%   BMI 26.07 kg/m   Visual Acuity Right Eye Distance:   Left Eye Distance:   Bilateral Distance:    Right Eye Near:   Left Eye Near:    Bilateral Near:     Physical Exam Constitutional:      Appearance: Normal appearance.  HENT:     Head: Normocephalic.     Right Ear: Tympanic membrane, ear canal and external ear normal.     Left Ear: Tympanic membrane, ear canal and external ear normal.     Nose: Congestion present. No rhinorrhea.     Mouth/Throat:     Mouth: Mucous membranes are moist.     Pharynx: No posterior oropharyngeal erythema.  Eyes:     Extraocular Movements: Extraocular movements intact.  Neurological:     Mental Status: She is alert and oriented to person, place, and time.      UC Treatments / Results  Labs (all labs ordered are listed, but only abnormal results are displayed) Labs Reviewed - No data to display  EKG   Radiology No results found.  Procedures Procedures (including critical care time)  Medications Ordered in UC Medications - No data to display  Initial Impression / Assessment and Plan / UC Course  I have reviewed the triage vital signs and the nursing notes.  Pertinent labs & imaging results that were available during my care of the patient  were reviewed by me and  considered in my medical decision making (see chart for details).  Otalgia of right ear, viral illness  Vital signs are stable patient is in no signs of distress nontoxic-appearing, no abnormalities are present to the right ear and etiology is most likely result of nasal and sinus congestion, discussed with patient, prescribed ipratropium nasal spray, advised continued use of Tylenol sinus which has a decongestant present as well as beginning an antihistamine, advised to follow-up if symptoms worsen or new symptoms begin Final Clinical Impressions(s) / UC Diagnoses   Final diagnoses:  None   Discharge Instructions   None    ED Prescriptions   None    PDMP not reviewed this encounter.   Hans Eden, NP 03/25/22 1102

## 2022-03-25 NOTE — Discharge Instructions (Signed)
On your ear exam there are no signs of infection such as redness, swelling or drainage therefore I do believe your symptoms are most likely related to your nasal congestion  Begin use of ipratropium nasal spray every morning and every evening to help clear out the sinuses which ideally will reduce your discomfort  Begin use of an allergy medicine such as cetirizine or loratadine to help minimize the amount of secretions produced by the body  You may continue your Tylenol sinus medication as it has a decongestant in it to help keep the area from being clogged  If you continue to have pain or new symptoms begin you may follow-up with this urgent care as needed

## 2022-03-25 NOTE — ED Triage Notes (Signed)
Patient c/o right ear pain that started yesterday.  Patient unsure of fevers.

## 2023-07-30 ENCOUNTER — Ambulatory Visit: Admitting: Podiatry

## 2023-08-29 ENCOUNTER — Ambulatory Visit: Admitting: Podiatry

## 2023-08-29 DIAGNOSIS — B351 Tinea unguium: Secondary | ICD-10-CM

## 2023-08-29 DIAGNOSIS — Z79899 Other long term (current) drug therapy: Secondary | ICD-10-CM

## 2023-08-29 DIAGNOSIS — L600 Ingrowing nail: Secondary | ICD-10-CM

## 2023-08-29 NOTE — Patient Instructions (Signed)
 Long Term Care Instructions-Post Nail Surgery  You have had your ingrown toenail and root treated with a chemical.  This chemical causes a burn that will drain and ooze like a blister.  This can drain for 6-8 weeks or longer.  It is important to keep this area clean, covered, and follow the soaking instructions dispensed at the time of your surgery.  This area will eventually dry and form a scab.  Once the scab forms you no longer need to soak or apply a dressing.  If at any time you experience an increase in pain, redness, swelling, or drainage, you should contact the office as soon as possible.  Place 1/4 cup of epsom salts in a quart of warm tap water.  Submerge your foot or feet in the solution and soak for 10 minutes.  This soak should be done twice a day.  Next, remove your foot or feet from solution, blot dry the affected area. Apply ointment and cover if instructed by your doctor.   IF YOUR SKIN BECOMES IRRITATED WHILE USING THESE INSTRUCTIONS, IT IS OKAY TO SWITCH TO  WHITE VINEGAR AND WATER.  As another alternative soak, you may use antibacterial soap and water.  Monitor for any signs/symptoms of infection. Call the office immediately if any occur or go directly to the emergency room. Call with any questions/concerns.

## 2023-08-29 NOTE — Progress Notes (Signed)
 Subjective:  Patient ID: Lisa Perkins, female    DOB: Jan 05, 1966,  MRN: [id number]  Chief Complaint  Patient presents with   Ingrown Toenail    58 y.o. female presents with the above complaint.  Patient presents with bilateral hallux medial border ingrown painful to touch is progressive and worse worse with ambulation worse with pressure she has it removed past about 2 times.  She wanted to get it evaluated has not seen anyone as prior to seeing me pain scale 7 out of 10 dull aching nature would like to have removed.  She has secondary complaint of thickened and onychodystrophy mycotic toenails x 10 she has tried some topical application none of which has helped.  She would like to discuss oral medication   Review of Systems: Negative except as noted in the HPI. Denies N/V/F/Ch.  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 , Disp: , Rfl:    ipratropium (ATROVENT ) 0.03 % nasal spray, Place 2 sprays into both nostrils every 12 (twelve) hours., Disp: 30 mL, Rfl: 12   lamoTRIgine (LAMICTAL) 150 MG tablet, Take 1 tablet by mouth daily., Disp: , Rfl:    lansoprazole (PREVACID) 30 MG capsule, , Disp: , Rfl:    neomycin -polymyxin-hydrocortisone (CORTISPORIN) OTIC solution, Apply 1-2 drops to toe after soaking twice a day, Disp: 10 mL, Rfl: 0   Pediatric Multiple Vit-C-FA (FLINSTONES GUMMIES OMEGA-3 DHA PO), Take by mouth., Disp: , Rfl:   Social History   Tobacco Use   Smoking Status Never  Smokeless Tobacco Never    Allergies  Allergen Reactions   Amoxicillin      REACTION: nausea  gi signs   Erythromycin    Tape    Objective:  There were no vitals filed for this visit. There is no height or weight on file to calculate BMI. Constitutional Well developed. Well nourished.  Vascular Dorsalis pedis pulses palpable bilaterally. Posterior tibial pulses palpable bilaterally. Capillary refill normal to all digits.  No cyanosis or clubbing noted. Pedal hair growth normal.  Neurologic Normal speech. Oriented to person, place, and time. Epicritic sensation to light touch grossly present bilaterally.  Dermatologic Painful ingrowing nail at medial nail borders of the hallux nail bilaterally. No other open wounds. No skin lesions.  Orthopedic: Normal joint ROM without pain or crepitus bilaterally. No visible deformities. No bony tenderness.   Radiographs: None Assessment:   1. Long-term use of high-risk medication   2. Nail fungus   3. Onychomycosis due to dermatophyte   4. Ingrown toenail of right foot   5. Ingrown left big toenail    Plan:  Patient was evaluated and treated and all questions answered.  Onychomycosis toenails x 10 -Educated the patient on the etiology of onychomycosis and various treatment options associated with improving the fungal load.  I explained to the patient that there is 3 treatment options  available to treat the onychomycosis including topical, p.o., laser treatment.  Patient elected to undergo p.o. options with Lamisil/terbinafine therapy.  In order for me to start the medication therapy, I explained to the patient the importance of evaluating the liver and obtaining the liver function test.  Once the liver function test comes back normal I will start him on 47-month course of Lamisil therapy.  Patient understood all risk and would like to proceed with Lamisil therapy.  I have asked the patient to immediately stop the  Lamisil therapy if she has any reactions to it and call the office or go to the emergency room right away.  Patient states understanding   Ingrown Nail, bilaterally -Patient elects to proceed with minor surgery to remove ingrown toenail removal today. Consent reviewed and signed by patient. -Ingrown nail excised. See procedure note. -Educated on post-procedure care including soaking. Written instructions provided and reviewed. -Patient to follow up in 2 weeks for nail check.  Procedure: Excision of Ingrown Toenail Location: Bilateral 1st toe medial nail borders. Anesthesia: Lidocaine 1% plain; 1.5 mL and Marcaine 0.5% plain; 1.5 mL, digital block. Skin Prep: Betadine. Dressing: Silvadene; telfa; dry, sterile, compression dressing. Technique: Following skin prep, the toe was exsanguinated and a tourniquet was secured at the base of the toe. The affected nail border was freed, split with a nail splitter, and excised. Chemical matrixectomy was then performed with phenol and irrigated out with alcohol. The tourniquet was then removed and sterile dressing applied. Disposition: Patient tolerated procedure well. Patient to return in 2 weeks for follow-up.   No follow-ups on file.

## 2023-09-24 ENCOUNTER — Ambulatory Visit: Admitting: Podiatry

## 2023-09-30 ENCOUNTER — Other Ambulatory Visit: Payer: Self-pay | Admitting: Internal Medicine

## 2023-09-30 DIAGNOSIS — Z1231 Encounter for screening mammogram for malignant neoplasm of breast: Secondary | ICD-10-CM

## 2023-10-08 ENCOUNTER — Telehealth: Payer: Self-pay | Admitting: Podiatry

## 2023-10-08 ENCOUNTER — Other Ambulatory Visit: Payer: Self-pay | Admitting: Podiatry

## 2023-10-08 MED ORDER — TERBINAFINE HCL 250 MG PO TABS: 250.0000 mg | ORAL_TABLET | Freq: Every day | ORAL | 0 refills | Status: DC

## 2023-10-08 NOTE — Telephone Encounter (Signed)
 Patient states blood results are in.  She is asking if the medication can be prescribed at this point.

## 2023-10-11 ENCOUNTER — Other Ambulatory Visit: Payer: Self-pay | Admitting: Podiatry

## 2023-10-11 MED ORDER — TERBINAFINE HCL 250 MG PO TABS: 250.0000 mg | ORAL_TABLET | Freq: Every day | ORAL | 0 refills | Status: AC

## 2023-11-07 ENCOUNTER — Ambulatory Visit: Admitting: Dietician

## 2024-02-07 ENCOUNTER — Encounter: Payer: Self-pay | Admitting: Emergency Medicine

## 2024-02-07 ENCOUNTER — Ambulatory Visit: Admission: EM | Admit: 2024-02-07 | Discharge: 2024-02-07 | Disposition: A | Discharge status: Home / Self Care

## 2024-02-07 DIAGNOSIS — K922 Gastrointestinal hemorrhage, unspecified: Secondary | ICD-10-CM

## 2024-02-07 DIAGNOSIS — K921 Melena: Secondary | ICD-10-CM

## 2024-02-07 NOTE — ED Triage Notes (Signed)
 Pt c/o lower abdominal pain and bloody stools. She states there was only 1 episode of bloody stools. Denies black tarry stools. Pain started about 4 days ago. She states her stomach feels better when it is empty. Pt also states she has had lots of gas and bloating. Pt has known IBS. Denies nausea, vomiting or diarrhea.

## 2024-02-07 NOTE — Discharge Instructions (Addendum)
 Please continue prevacid Follow up with PCP for further evaluation of lower abdominal pain, blood in stool, need referral to GI for colonoscopy-call for appt If you have new or worsening symptoms(vomiting blood, repeat bloody stool, worsening abdominal pain, etc) go to the emergency room for further evaluation(labs,imaging) Avoid aleve, ibuprofen, alcohol,spicy,greasy,fried foods as it will aggravate symptoms

## 2024-02-07 NOTE — ED Provider Notes (Signed)
 " [facility]    CSN: [id number] Arrival date & time: 02/07/24  1020      History   Chief Complaint Chief Complaint  Patient presents with   Rectal Bleeding   Abdominal Pain    HPI Lisa Perkins is a 58 y.o. female.   Lisa Perkins, 58 year old female, presents to urgent care for evaluation of lower abdominal pain(3/10,crampy) and 1 bloody stool; patient states she had 1 episode of bloody stool, denies black tarry stools, pain started about 4 days ago after bloody stool . Patient states her stomach feels better when it is empty, +decreased appetite, +coffee, patient also states she has lots of gas and bloating, denies any nausea, vomiting or diarrhea. No dysuria or fever. 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 No language interpreter was used.  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    Patient Active Problem List   Diagnosis Date Noted   Acute GI bleeding 02/07/2024   Bloody stool 02/07/2024   Medication management 06/10/2017   Anxiety 01/31/2017   Bipolar disorder (HCC) 01/31/2017   Depressive disorder 01/31/2017   Iron deficiency anemia 05/11/2015   Allergic sinusitis 06/05/2013   Muscle spasms of neck trapezious 04/22/2012   GERD (gastroesophageal reflux disease) 04/22/2012    Drug interaction 04/22/2012   Benign neoplasm of skin 07/25/2009   DEPRESSION 11/07/2007   Allergic rhinitis 11/07/2007   ORAL APHTHAE 11/07/2007   GERD 11/07/2007    Past Surgical History:  Procedure Laterality Date   TONSILLECTOMY AND ADENOIDECTOMY     WISDOM TOOTH EXTRACTION      OB History     Gravida  0   Para  0   Term  0   Preterm  0   AB  0   Living  0      SAB  0   IAB  0   Ectopic  0   Multiple  0   Live Births  0            Home Medications    Prior to Admission medications  Medication Sig Start Date End Date Taking? Authorizing Provider  busPIRone (BUSPAR) 10 MG tablet buspirone 10 mg tablet   Yes [provider]  citalopram (CELEXA) 20 MG tablet Take 30 mg by mouth daily.   Yes [provider]  clonazePAM (KLONOPIN) 0.5 MG tablet Take 0.5 mg by mouth at bedtime as needed.  12/21/10  Yes [provider]  lamoTRIgine (LAMICTAL) 150 MG tablet Take 1 tablet by mouth daily. 10/29/13  Yes [provider]  lansoprazole (PREVACID) 30 MG capsule  06/02/13  Yes [provider]  naproxen sodium (ALEVE) 220 MG tablet Take 220 mg by mouth.   Yes [provider]  Pediatric Multiple Vit-C-FA (FLINSTONES GUMMIES OMEGA-3 DHA PO) Take by mouth.   Yes [provider]  ipratropium (ATROVENT ) 0.03 % nasal spray Place 2 sprays into both nostrils every 12 (twelve) hours. 03/25/22   Teresa Shelba SAUNDERS, NP  neomycin -polymyxin-hydrocortisone (CORTISPORIN) OTIC solution Apply 1-2 drops to toe after soaking twice a day 10/12/19   Magdalen Pasco RAMAN, DPM  terbinafine  (LAMISIL ) 250 MG tablet Take 1 tablet (250 mg total) by mouth daily. 10/11/23   Tobie Franky SQUIBB, DPM    Family History Family History  Problem Relation Age of Onset   Endometriosis Mother    Hypertension Mother    Allergic rhinitis Mother    Food Allergy Mother        shrimp, egg white   COPD Father    Dementia Father    Allergic rhinitis Father     Asthma Father    Diabetes Maternal Grandmother    Hypertension Maternal Grandmother    Stroke Maternal Grandmother    Hypertension Maternal Grandfather    Stroke Maternal Grandfather    Stroke Paternal Grandmother    Stroke Paternal Grandfather    Diabetes Maternal Uncle    Breast cancer Paternal Aunt 20       bilateral mastectomy - survivor   Angioedema Neg Hx    Eczema Neg Hx    Urticaria Neg Hx     Social History Social History[1]   Allergies   Amoxicillin , Erythromycin, and Tape   Review of Systems Review of Systems  Constitutional:  Negative for fever.  Gastrointestinal:  Positive for abdominal pain, blood in stool, constipation and hematochezia. Negative for diarrhea, nausea and vomiting.       Gas,bloating  Genitourinary:  Negative for dysuria.  All other systems reviewed and are negative.    Physical Exam Triage Vital Signs ED Triage Vitals  Encounter Vitals Group     BP --      Girls Systolic BP Percentile --      Girls Diastolic BP Percentile --      Boys Systolic BP Percentile --      Boys Diastolic BP Percentile --      Pulse --      Resp --      Temp --      Temp src --      SpO2 --      Weight 02/07/24 1102 164 lb 0.4 oz (74.4 kg)     Height 02/07/24 1102 5' 6.5 (1.689 m)     Head Circumference --      Peak Flow --      Pain Score 02/07/24 1101 3     Pain Loc --      Pain Education --      Exclude from Growth Chart --    No data found.  Updated Vital Signs BP 119/71 (BP Location: Right Arm)   Pulse 84   Temp 98.3 F (36.8 C) (Oral)   Resp 16   Ht 5' 6.5 (1.689 m)   Wt 164 lb 0.4 oz (74.4 kg)   LMP 09/02/2017   SpO2 98%   BMI 26.08 kg/m   Visual Acuity Right Eye Distance:   Left Eye Distance:   Bilateral Distance:    Right Eye Near:   Left Eye Near:    Bilateral Near:     Physical Exam Vitals and nursing note reviewed.  Constitutional:      General: She is not in acute distress.    Appearance:  She is  well-developed.  HENT:     Head: Normocephalic and atraumatic.  Eyes:     Conjunctiva/sclera: Conjunctivae normal.  Cardiovascular:     Rate and Rhythm: Normal rate and regular rhythm.     Heart sounds: No murmur heard. Pulmonary:     Effort: Pulmonary effort is normal. No respiratory distress.     Breath sounds: Normal breath sounds.  Abdominal:     General: Bowel sounds are normal.     Palpations: Abdomen is soft.     Tenderness: There is no abdominal tenderness. There is no guarding or rebound.     Comments: Old horizontal scar on abdomen noted  Musculoskeletal:        General: No swelling.     Cervical back: Neck supple.  Skin:    General: Skin is warm and dry.     Capillary Refill: Capillary refill takes less than 2 seconds.  Neurological:     General: No focal deficit present.     Mental Status: She is alert and oriented to person, place, and time.     GCS: GCS eye subscore is 4. GCS verbal subscore is 5. GCS motor subscore is 6.  Psychiatric:        Attention and Perception: Attention normal.        Mood and Affect: Mood normal.        Speech: Speech normal.        Behavior: Behavior normal.      UC Treatments / Results  Labs (all labs ordered are listed, but only abnormal results are displayed) Labs Reviewed - No data to display  EKG   Radiology No results found.  Procedures Procedures (including critical care time)  Medications Ordered in UC Medications - No data to display  Initial Impression / Assessment and Plan / UC Course  I have reviewed the triage vital signs and the nursing notes.  Pertinent labs & imaging results that were available during my care of the patient were reviewed by me and considered in my medical decision making (see chart for details).    Discussed exam findings and plan of care with patient, vital signs are stable, no acute distress noted in office, pt is afebrile, nontachycardiac, discussed with patient that we do not have  ability to perform stat labs in our office, exam is essentially negative today ; referred patient to ER if her symptoms are worsening.  In meantime would avoid spicy, greasy, fried foods, alcohol and coffee.  Continue Prevacid, follow-up with PCP, will need referral to GI.  Patient verbalized understanding to this provider.  Ddx: Bloody stool, abdominal pain, gastritis, ulcer, IBS, diverticulitis, gallbladder disease Final Clinical Impressions(s) / UC Diagnoses   Final diagnoses:  Bloody stool     Discharge Instructions      Please continue prevacid Follow up with PCP for further evaluation of lower abdominal pain, blood in stool, need referral to GI for colonoscopy-call for appt If you have new or worsening symptoms(vomiting blood, repeat bloody stool, worsening abdominal pain, etc) go to the emergency room for further evaluation(labs,imaging) Avoid aleve, ibuprofen, alcohol,spicy,greasy,fried foods as it will aggravate symptoms       ED Prescriptions   None    PDMP not reviewed this encounter.     [1]  Social History Tobacco Use   Smoking status: Never   Smokeless tobacco: Never  Vaping Use   Vaping status: Never Used  Substance Use Topics   Alcohol use: Yes  Comment: rare   Drug use: No     Fonnie Crookshanks, Rilla, NP 02/07/24 1158  "

## 2024-03-20 ENCOUNTER — Ambulatory Visit: Payer: Self-pay
